# Patient Record
Sex: Female | Born: 1955 | Race: White | Hispanic: No | Marital: Married | Smoking: Never smoker
Health system: Southern US, Community
[De-identification: ages and names within clinical notes are randomized; demographics above are authoritative.]

## PROBLEM LIST (undated history)

## (undated) DIAGNOSIS — E859 Amyloidosis, unspecified: Secondary | ICD-10-CM

## (undated) DIAGNOSIS — C801 Malignant (primary) neoplasm, unspecified: Secondary | ICD-10-CM

## (undated) DIAGNOSIS — E785 Hyperlipidemia, unspecified: Secondary | ICD-10-CM

## (undated) DIAGNOSIS — M329 Systemic lupus erythematosus, unspecified: Secondary | ICD-10-CM

## (undated) DIAGNOSIS — IMO0002 Reserved for concepts with insufficient information to code with codable children: Secondary | ICD-10-CM

## (undated) DIAGNOSIS — K219 Gastro-esophageal reflux disease without esophagitis: Secondary | ICD-10-CM

## (undated) DIAGNOSIS — M1990 Unspecified osteoarthritis, unspecified site: Secondary | ICD-10-CM

## (undated) HISTORY — DX: Unspecified osteoarthritis, unspecified site: M19.90

## (undated) HISTORY — DX: Hyperlipidemia, unspecified: E78.5

## (undated) HISTORY — PX: BREAST EXCISIONAL BIOPSY: SUR124

## (undated) HISTORY — PX: ABDOMINAL HYSTERECTOMY: SHX81

## (undated) HISTORY — PX: BREAST LUMPECTOMY: SHX2

## (undated) HISTORY — PX: BLADDER SURGERY: SHX569

## (undated) HISTORY — DX: Amyloidosis, unspecified: E85.9

---

## 2004-09-20 ENCOUNTER — Ambulatory Visit: Payer: Self-pay | Admitting: Family Medicine

## 2004-09-27 ENCOUNTER — Ambulatory Visit: Payer: Self-pay | Admitting: Family Medicine

## 2006-10-02 ENCOUNTER — Ambulatory Visit: Payer: Self-pay | Admitting: Internal Medicine

## 2006-10-16 ENCOUNTER — Ambulatory Visit: Payer: Self-pay | Admitting: Unknown Physician Specialty

## 2007-02-16 ENCOUNTER — Emergency Department: Payer: Self-pay | Admitting: Emergency Medicine

## 2007-06-12 ENCOUNTER — Observation Stay: Payer: Self-pay | Admitting: Internal Medicine

## 2007-06-12 ENCOUNTER — Other Ambulatory Visit: Payer: Self-pay

## 2007-06-13 ENCOUNTER — Other Ambulatory Visit: Payer: Self-pay

## 2007-07-06 ENCOUNTER — Ambulatory Visit: Payer: Self-pay | Admitting: Family Medicine

## 2007-08-17 ENCOUNTER — Ambulatory Visit: Payer: Self-pay | Admitting: Family Medicine

## 2011-11-09 ENCOUNTER — Emergency Department (INDEPENDENT_AMBULATORY_CARE_PROVIDER_SITE_OTHER)
Admission: EM | Admit: 2011-11-09 | Discharge: 2011-11-09 | Disposition: A | Discharge status: Home / Self Care | Payer: Self-pay | Source: Home / Self Care | Attending: Family Medicine | Admitting: Family Medicine

## 2011-11-09 ENCOUNTER — Encounter (HOSPITAL_COMMUNITY): Payer: Self-pay | Admitting: Emergency Medicine

## 2011-11-09 DIAGNOSIS — E119 Type 2 diabetes mellitus without complications: Secondary | ICD-10-CM

## 2011-11-09 HISTORY — DX: Gastro-esophageal reflux disease without esophagitis: K21.9

## 2011-11-09 HISTORY — DX: Reserved for concepts with insufficient information to code with codable children: IMO0002

## 2011-11-09 HISTORY — DX: Systemic lupus erythematosus, unspecified: M32.9

## 2011-11-09 LAB — POCT I-STAT, CHEM 8
BUN: 23 mg/dL (ref 6–23)
Chloride: 100 mEq/L (ref 96–112)
Creatinine, Ser: 1.1 mg/dL (ref 0.50–1.10)
Sodium: 136 mEq/L (ref 135–145)
TCO2: 28 mmol/L (ref 0–100)

## 2011-11-09 MED ORDER — SITAGLIPTIN PHOS-METFORMIN HCL 50-1000 MG PO TABS: 1.0000 | ORAL_TABLET | Freq: Two times a day (BID) | ORAL | Status: DC

## 2011-11-09 NOTE — ED Notes (Signed)
Patient discharged by sandra emt

## 2011-11-09 NOTE — ED Provider Notes (Signed)
History     CSN: [id number]  Arrival date & time 11/09/11  1404   First MD Initiated Contact with Patient 11/09/11 1423      Chief Complaint  Patient presents with  . Diabetes    (Consider location/radiation/quality/duration/timing/severity/associated sxs/prior treatment) Patient is a 56 y.o. female presenting with diabetes problem. The history is provided by the patient.  Diabetes She presents for her initial diabetic visit. She has type 2 diabetes mellitus. Pertinent negatives for hypoglycemia include no confusion, dizziness, mood changes, nervousness/anxiousness, pallor, seizures, sleepiness, speech difficulty, sweats or tremors. Associated symptoms include polydipsia, polyuria and visual change. Pertinent negatives for diabetes include no blurred vision, no chest pain, no fatigue, no foot paresthesias, no foot ulcerations, no polyphagia, no weakness and no weight loss. Pertinent negatives for hypoglycemia complications include no blackouts, no hospitalization, no nocturnal hypoglycemia, no required assistance and no required glucagon injection. Symptoms are stable. There are no diabetic complications. Risk factors for coronary artery disease include family history (mother w/harding of the arteries  ad diabetes). Her weight is stable. She has not had a previous visit with a dietician. She participates in exercise intermittently. She does not see a podiatrist.Eye exam is current.    Past Medical History  Diagnosis Date  . Diabetes mellitus   . GERD (gastroesophageal reflux disease)   . Lupus     Past Surgical History  Procedure Date  . Abdominal hysterectomy   . Bladder surgery     History reviewed. No pertinent family history.  History  Substance Use Topics  . Smoking status: Never Smoker   . Smokeless tobacco: Not on file  . Alcohol Use: No    OB History    Grav Para Term Preterm Abortions TAB SAB Ect Mult Living                  Review of Systems  Constitutional:  Negative for weight loss and fatigue.  Eyes: Negative for blurred vision.  Cardiovascular: Negative for chest pain.  Genitourinary: Positive for polyuria.  Skin: Negative for pallor.  Neurological: Negative for dizziness, tremors, seizures, speech difficulty and weakness.  Hematological: Positive for polydipsia. Negative for polyphagia.  Psychiatric/Behavioral: Negative for confusion. The patient is not nervous/anxious.     Allergies  Review of patient's allergies indicates no known allergies.  Home Medications   Current Outpatient Rx  Name Route Sig Dispense Refill  . IBUPROFEN 200 MG PO TABS Oral Take 200 mg by mouth every 6 (six) hours as needed.    Marland Kitchen OMEPRAZOLE 20 MG PO CPDR Oral Take 20 mg by mouth daily.    Marland Kitchen PHENAZOPYRIDINE HCL 100 MG PO TABS Oral Take 100 mg by mouth 3 (three) times daily as needed.    . SULFAMETHOXAZOLE-TMP DS 800-160 MG PO TABS Oral Take 1 tablet by mouth 2 (two) times daily.      BP 152/80  Pulse 86  Temp(Src) 98.3 F (36.8 C) (Oral)  Resp 12  SpO2 98%  Physical Exam  Constitutional: She appears well-developed and well-nourished.  HENT:  Head: Normocephalic.  Neck: Normal range of motion.  Cardiovascular: Normal rate, regular rhythm and normal heart sounds.   Pulmonary/Chest: Effort normal.  Neurological: She is alert.  Skin: Skin is warm and dry.  Psychiatric: She has a normal mood and affect. Her behavior is normal.    ED Course  Procedures (including critical care time)    Results for orders placed during the hospital encounter of 11/09/11  POCT I-STAT, CHEM 8  Component Value Range   Sodium 136  135 - 145 (mEq/L)   Potassium 4.1  3.5 - 5.1 (mEq/L)   Chloride 100  96 - 112 (mEq/L)   BUN 23  6 - 23 (mg/dL)   Creatinine, Ser 1.61  0.50 - 1.10 (mg/dL)   Glucose, Bld 096 (*) 70 - 99 (mg/dL)   Calcium, Ion 0.45  4.09 - 1.32 (mmol/L)   TCO2 28  0 - 100 (mmol/L)   Hemoglobin 14.6  12.0 - 15.0 (g/dL)   HCT 81.1  91.4 - 78.2 (%)    Diabetes.  A1C pending Follow up w/a PCP ofchoice  MDM          Hassan Rowan, MD 11/09/11 1610

## 2011-11-09 NOTE — ED Notes (Signed)
[facility].  Diagnosed with uti, and noted sugar in urine.  [facility] coming to [facility] for further evaluation of glucose.  Report eating around 1:00 pm today: piece of chicken, slaw and water.  Patient reports in 2007 or 2008 was diagnosed as diabetic, patient took metformin and actos.  Then, 2010 physician stopped medicine and told her she did not need medicine.  Has not seen physician since then.

## 2011-11-12 ENCOUNTER — Telehealth (HOSPITAL_COMMUNITY): Payer: Self-pay | Admitting: *Deleted

## 2011-11-12 NOTE — ED Notes (Signed)
Hgb A1C 11.3 H, avg. daily glucose 278. Dr. Thurmond Butts started pt. on Janumet but is not here the rest of the month. Lab shown to Dr. Lorenz Coaster, and he said to tell pt. to f/u with PCP ASAP. I called pt. and verified x 2. Pt. given results and instructions. Pt. states she had diabetes '08-'10, but " her doctor said it cleared up and was taken off medication. Pt. has no PCP or insurance. 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 Pt. is going to try and get on with her husbands doctor. Dr. Thurmond Butts told her she could come to [facility] to get a refill of her Janumet. I also suggested she call [facility] and gave her the phone numbers. Vassie Moselle 11/12/2011

## 2013-12-29 ENCOUNTER — Encounter (HOSPITAL_COMMUNITY): Payer: Self-pay | Admitting: Emergency Medicine

## 2013-12-29 ENCOUNTER — Emergency Department (HOSPITAL_COMMUNITY)
Admission: EM | Admit: 2013-12-29 | Discharge: 2013-12-29 | Disposition: A | Discharge status: Home / Self Care | Payer: BC Managed Care – PPO | Source: Home / Self Care | Attending: Family Medicine | Admitting: Family Medicine

## 2013-12-29 DIAGNOSIS — J069 Acute upper respiratory infection, unspecified: Secondary | ICD-10-CM

## 2013-12-29 MED ORDER — BENZONATATE 100 MG PO CAPS: 100.0000 mg | ORAL_CAPSULE | Freq: Three times a day (TID) | ORAL | Status: DC | PRN

## 2013-12-29 NOTE — Discharge Instructions (Signed)

## 2013-12-29 NOTE — ED Provider Notes (Signed)
Medical screening examination/treatment/procedure(s) were performed by a resident physician or non-physician practitioner and as the supervising physician I was immediately available for consultation/collaboration.  Lynne Leader, MD    Gregor Hams, MD 12/29/13 [phone]

## 2013-12-29 NOTE — ED Notes (Signed)
Pt  Reports  Symptoms  Of   Body  Aches      Chills  Headaches  Treated    Recently    For  Throat  Infection       Reports       Urinary  Irritation as  Well  Pt  Still  Has  2  Days  Of  Anti  Biotics  Left

## 2013-12-29 NOTE — ED Provider Notes (Signed)
CSN: [id number]     Arrival date & time 12/29/13  0804 History   First MD Initiated Contact with Patient 12/29/13 [phone]     No chief complaint on file.  (Consider location/radiation/quality/duration/timing/severity/associated sxs/prior Treatment) HPI Comments: States she was seen for sore throat at [facility] in [place], [place] on 12-22-2013 and diagnosed with strep pharyngitis and placed on antibiotics. Has been taking medication as prescribed but has now developed nasal congestion, cough and malaise. Presents for evaluation. Is not taking any additional meds for URI sx at home. PCP: [facility] in [place] (has not attempted to be seen by PCP).   Patient is a 58 y.o. female presenting with URI. The history is provided by the patient.  URI Presenting symptoms: congestion, cough, fatigue and rhinorrhea   Presenting symptoms: no fever and no sore throat   Severity:  Moderate Onset quality:  Gradual Duration:  3 days Timing:  Constant Progression:  Unchanged Chronicity:  New   Past Medical History  Diagnosis Date  . Diabetes mellitus   . GERD (gastroesophageal reflux disease)   . Lupus    Past Surgical History  Procedure Laterality Date  . Abdominal hysterectomy    . Bladder surgery     No family history on file. History  Substance Use Topics  . Smoking status: Never Smoker   . Smokeless tobacco: Not on file  . Alcohol Use: No   OB History   Grav Para Term Preterm Abortions TAB SAB Ect Mult Living                 Review of Systems  Constitutional: Positive for fatigue. Negative for fever.  HENT: Positive for congestion and rhinorrhea. Negative for sore throat.   Eyes: Negative.   Respiratory: Positive for cough. Negative for chest tightness.   Cardiovascular: Negative.   Gastrointestinal: Negative.   Endocrine: Negative for polydipsia and polyphagia.  Musculoskeletal: Negative.   Skin: Negative.   Neurological: Negative.     Allergies  Review of patient's  allergies indicates no known allergies.  Home Medications   Prior to Admission medications   Medication Sig Start Date End Date Taking? Authorizing Provider  ibuprofen (ADVIL,MOTRIN) 200 MG tablet Take 200 mg by mouth every 6 (six) hours as needed.    Historical Provider, MD  omeprazole (PRILOSEC) 20 MG capsule Take 20 mg by mouth daily.    Historical Provider, MD  phenazopyridine (PYRIDIUM) 100 MG tablet Take 100 mg by mouth 3 (three) times daily as needed.    Historical Provider, MD  sitaGLIPtan-metformin (JANUMET) 50-1000 MG per tablet Take 1 tablet by mouth 2 (two) times daily with a meal. 11/09/11 11/08/12  Frederich Cha, MD  sulfamethoxazole-trimethoprim (BACTRIM DS) 800-160 MG per tablet Take 1 tablet by mouth 2 (two) times daily.    Historical Provider, MD   BP 145/90  Pulse 96  Temp(Src) 98.3 F (36.8 C) (Oral)  Resp 16  SpO2 99% Physical Exam  Nursing note and vitals reviewed. Constitutional: She is oriented to person, place, and time. She appears well-developed and well-nourished. No distress.  HENT:  Head: Normocephalic and atraumatic.  Right Ear: Hearing, tympanic membrane, external ear and ear canal normal.  Left Ear: Hearing, tympanic membrane, external ear and ear canal normal.  Nose: Nose normal.  Mouth/Throat: Uvula is midline, oropharynx is clear and moist and mucous membranes are normal. No oral lesions. No trismus in the jaw. No uvula swelling.  Eyes: Conjunctivae are normal. Right eye exhibits no discharge. Left eye  exhibits no discharge. No scleral icterus.  Neck: Normal range of motion. Neck supple. No thyromegaly present.  Cardiovascular: Normal rate, regular rhythm and normal heart sounds.   Pulmonary/Chest: Effort normal and breath sounds normal. No respiratory distress. She has no wheezes.  Abdominal: Soft. Bowel sounds are normal. She exhibits no distension. There is no tenderness.  Musculoskeletal: Normal range of motion.  Lymphadenopathy:    She has no  cervical adenopathy.  Neurological: She is alert and oriented to person, place, and time.  Skin: Skin is warm and dry. No rash noted.  Psychiatric: She has a normal mood and affect. Her behavior is normal.    ED Course  Procedures (including critical care time) Labs Review Labs Reviewed - No data to display  Results for orders placed during the hospital encounter of 11/09/11  HEMOGLOBIN A1C      Result Value Ref Range   Hemoglobin A1C 11.3 (*) <5.7 %   Mean Plasma Glucose 278 (*) <117 mg/dL  POCT I-STAT, CHEM 8      Result Value Ref Range   Sodium 136  135 - 145 mEq/L   Potassium 4.1  3.5 - 5.1 mEq/L   Chloride 100  96 - 112 mEq/L   BUN 23  6 - 23 mg/dL   Creatinine, Ser 1.10  0.50 - 1.10 mg/dL   Glucose, Bld 307 (*) 70 - 99 mg/dL   Calcium, Ion 1.17  1.12 - 1.32 mmol/L   TCO2 28  0 - 100 mmol/L   Hemoglobin 14.6  12.0 - 15.0 g/dL   HCT 43.0  36.0 - 46.0 %   Imaging Review No results found.   MDM   1. URI (upper respiratory infection)    URI: will advise good hydration, tylenol as directed on packaging and Tessalon for cough.     [place], [place] 12/29/13 [phone]

## 2015-06-15 ENCOUNTER — Encounter: Payer: Self-pay | Admitting: Primary Care

## 2015-06-15 ENCOUNTER — Ambulatory Visit (INDEPENDENT_AMBULATORY_CARE_PROVIDER_SITE_OTHER): Payer: BLUE CROSS/BLUE SHIELD | Admitting: Primary Care

## 2015-06-15 VITALS — BP 116/82 | HR 75 | Temp 97.5°F | Ht 64.75 in | Wt 139.1 lb

## 2015-06-15 DIAGNOSIS — E1169 Type 2 diabetes mellitus with other specified complication: Secondary | ICD-10-CM | POA: Insufficient documentation

## 2015-06-15 DIAGNOSIS — E785 Hyperlipidemia, unspecified: Secondary | ICD-10-CM | POA: Diagnosis not present

## 2015-06-15 DIAGNOSIS — IMO0002 Reserved for concepts with insufficient information to code with codable children: Secondary | ICD-10-CM | POA: Insufficient documentation

## 2015-06-15 DIAGNOSIS — E1165 Type 2 diabetes mellitus with hyperglycemia: Secondary | ICD-10-CM | POA: Insufficient documentation

## 2015-06-15 DIAGNOSIS — K219 Gastro-esophageal reflux disease without esophagitis: Secondary | ICD-10-CM

## 2015-06-15 DIAGNOSIS — E119 Type 2 diabetes mellitus without complications: Secondary | ICD-10-CM | POA: Diagnosis not present

## 2015-06-15 MED ORDER — SIMVASTATIN 20 MG PO TABS: 20.0000 mg | ORAL_TABLET | Freq: Every day | ORAL | Status: DC

## 2015-06-15 NOTE — Patient Instructions (Addendum)
Start Simvastatin 20 mg tablets for cholesterol. Take 1 tablet by mouth at bedtime.  Please schedule a follow up appointment in 3 months for re-evaluation of diabetes and cholesterol. Come fasting for at least 4 hours (water and black coffee only) as we will do blood work that day.  It was a pleasure to meet you today! Please don't hesitate to call me with any questions. Welcome to [facility]!

## 2015-06-15 NOTE — Progress Notes (Signed)
Subjective:    Patient ID: Miranda Greene, female    DOB: [birth year], 59 y.o.   MRN: [id number]  HPI  Miranda Greene is a 59 year old female who presents today to establish care and discuss the problems mentioned below. Will review old records.  1) Type 2 Diabetes: Diagnosed in 2005, went in remission in 2009 and returned back in 2012. Currently managed on glipizide 10 mg BID, invokana 100 mg once daily, and Saxagliptin-Metformin 01-999 mg once daily. Her last A1C was 7.3 in September 2016. She reports a lot of stress since April this year. She endorses a healthy diet and leads an active lifestyle.   Her diet currently consists of: Breakfast: Cereal Snack: Cheese and crackers, apple, yougurt Lunch: Lean cuseine, hot dog, hamburger, veggies. Snack: Apple, yougurt Dinner: [employer], veggies, salads Snack: Occasionally, salsa and chips Desserts: None Beverages: Coffee, flavored water, unsweet tea, water  Exercise: She does not regularly exercise but leads an active lifestyle.    2) Urinary incontinence: History of 2 bladder surgeries, last one in 2012 with band/mesh surgery. She does not currently follow with a Urologist at this time.   3) GERD: Currently managed on omeprazole 20 mg tablets every other day. Without her medication she will experience esophageal burning and occasional reflux.   Review of Systems  Constitutional: Negative for unexpected weight change.  HENT: Negative for rhinorrhea.   Respiratory: Negative for cough and shortness of breath.   Cardiovascular: Negative for chest pain.  Gastrointestinal: Negative for diarrhea and constipation.  Genitourinary: Negative for difficulty urinating.  Musculoskeletal: Positive for arthralgias.  Skin: Negative for rash.  Allergic/Immunologic: Positive for environmental allergies.  Neurological: Negative for dizziness, numbness and headaches.  Psychiatric/Behavioral:       Denies concerns for anxiety or depression       Past  Medical History  Diagnosis Date  . Diabetes mellitus   . GERD (gastroesophageal reflux disease)   . Lupus ([place])   . Arthritis     Hands, feet    Social History   Social History  . Marital Status: Married    Spouse Name: N/A  . Number of Children: N/A  . Years of Education: N/A   Occupational History  . Not on file.   Social History Main Topics  . Smoking status: Never Smoker   . Smokeless tobacco: Not on file  . Alcohol Use: No  . Drug Use: No  . Sexual Activity: Not on file   Other Topics Concern  . Not on file   Social History Narrative   Married.   4 children.    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    Past Surgical History  Procedure Laterality Date  . Abdominal hysterectomy    . Bladder surgery    . Breast lumpectomy      Benign    Family History  Problem Relation Age of Onset  . Arthritis Mother   . Stroke Mother   . Kidney disease Mother   . Diabetes Mother   . Dementia Mother   . Alcohol abuse Father   . Lung cancer Father   . Heart disease Father   . Stroke Maternal Grandmother   . Arthritis Paternal Grandmother   . Heart disease Paternal Grandmother   . Diabetes Paternal Grandmother     No Known Allergies  Current Outpatient Prescriptions on File Prior to Visit  Medication  Sig Dispense Refill  . ibuprofen (ADVIL,MOTRIN) 200 MG tablet Take 200 mg by mouth every 6 (six) hours as needed.    Marland Kitchen omeprazole (PRILOSEC) 20 MG capsule Take 20 mg by mouth daily.     No current facility-administered medications on file prior to visit.    BP 116/82 mmHg  Pulse 75  Temp(Src) 97.5 F (36.4 C) (Oral)  Ht 5' 4.75" (1.645 m)  Wt 139 lb 1.9 oz (63.104 kg)  BMI 23.32 kg/m2  SpO2 98%    Objective:   Physical Exam  Constitutional: She is oriented to person, place, and time. She appears well-nourished.  Cardiovascular: Normal rate and regular rhythm.     Pulmonary/Chest: Effort normal and breath sounds normal.  Neurological: She is alert and oriented to person, place, and time.  Skin: Skin is warm and dry.  Psychiatric: She has a normal mood and affect.          Assessment & Plan:

## 2015-06-15 NOTE — Progress Notes (Signed)
Pre visit review using our clinic review tool, if applicable. No additional management support is needed unless otherwise documented below in the visit note. 

## 2015-06-15 NOTE — Assessment & Plan Note (Signed)
Currently takes omeprazole 20 mg every other day for symptoms of esophageal burning. Occasional reflux.

## 2015-06-15 NOTE — Assessment & Plan Note (Addendum)
Diagnosed in 2005. Currently managed on glipizide, invokana, saxagliptin-metformin. A1C of 7.3 in 05/2015. Discussed importance of healthy diet and exercise. Foot exam completed today. Urine microalbumin completed in 05/2015 and was normal per records. Statin initiated today due to long standing history of hyperlipidemia. Recheck A1C in 3 months.

## 2015-06-15 NOTE — Assessment & Plan Note (Signed)
Reviewed from records, long standing history of LDL above goal. She is not currently managed on a statin, initiated Simvastatin 20 mg HS today. Discussed potential side effects. Will recheck lipids in 3 months.

## 2015-10-19 ENCOUNTER — Other Ambulatory Visit: Payer: Self-pay | Admitting: Primary Care

## 2015-10-19 ENCOUNTER — Ambulatory Visit (INDEPENDENT_AMBULATORY_CARE_PROVIDER_SITE_OTHER): Payer: BLUE CROSS/BLUE SHIELD | Admitting: Primary Care

## 2015-10-19 ENCOUNTER — Encounter: Payer: Self-pay | Admitting: Primary Care

## 2015-10-19 VITALS — BP 122/86 | HR 82 | Temp 97.6°F | Ht 65.0 in | Wt 141.8 lb

## 2015-10-19 DIAGNOSIS — E119 Type 2 diabetes mellitus without complications: Secondary | ICD-10-CM

## 2015-10-19 DIAGNOSIS — E785 Hyperlipidemia, unspecified: Secondary | ICD-10-CM | POA: Diagnosis not present

## 2015-10-19 LAB — LIPID PANEL
Cholesterol: 128 mg/dL (ref 0–200)
HDL: 35.3 mg/dL — AB (ref >39.00)
LDL Cholesterol: 74 mg/dL (ref 0–99)
NONHDL: 92.47
Total CHOL/HDL Ratio: 4
Triglycerides: 94 mg/dL (ref 0.0–149.0)
VLDL: 18.8 mg/dL (ref 0.0–40.0)

## 2015-10-19 LAB — HEMOGLOBIN A1C: HEMOGLOBIN A1C: 7.7 % — AB (ref 4.6–6.5)

## 2015-10-19 NOTE — Progress Notes (Signed)
Pre visit review using our clinic review tool, if applicable. No additional management support is needed unless otherwise documented below in the visit note. 

## 2015-10-19 NOTE — Assessment & Plan Note (Signed)
Slightly improved diet. A1C due today and is pending. Foot exam completed last visit, declines pneumonia vaccination, currently on statin, normal microalbumin, not currently on ACE.

## 2015-10-19 NOTE — Addendum Note (Signed)
Addended by: Marchia Bond on: 10/19/2015 01:38 PM   Modules accepted: Miquel Dunn

## 2015-10-19 NOTE — Assessment & Plan Note (Signed)
Takes simvastatin 20 mg 4-5 times weekly as she often forgets. Denies myalgias. Lipid panel pending today.  Discussed importance of healthy diet and exercise.

## 2015-10-19 NOTE — Patient Instructions (Addendum)
Complete lab work prior to leaving today. I will notify you of your results once received.   Please schedule a physical with me in 6 months. You may also schedule a lab only appointment 3-4 days prior. We will discuss your lab results in detail during your physical.  It was a pleasure to see you today!  

## 2015-10-19 NOTE — Progress Notes (Signed)
Subjective:    Patient ID: Miranda Greene, female    DOB: [birth year], 58 y.o.   MRN: [id number]  HPI  Miranda Greene is a 60 year old female who presents today for follow up.  1) Type 2 Diabetes: Currently managed on Invokana 100 mg, glipizide 10 mg BID, and saxagliptin-metformin 01-999 mg. A1C of 7.3 in September 2016, normal microalbumin per records, foot exam completed in October 2016, currently not managed on an ACE. She was initiated on a statin during her last visit. She was encouraged to work on improving her diet and exercise. She is due for repeat A1C today. Declines pneumonia vaccination today.  2) Hyperlipidemia: Long history of hyperlipidemia. Initiated on statin therapy in October 2016 with Simvastatin 20 mg. She admits to forgetting to take her Simvastatin about 3 times weekly. Denies myalgias.   She's improved her diet by cutting back on her consumption of crackers and cheese and fast food, and is eating more kale salad. 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 She has been under stress with her husbands health and her son's car accident. Denies chest pain or shortness of breath.   Wt Readings from Last 3 Encounters:  10/19/15 141 lb 12.8 oz (64.32 kg)  06/15/15 139 lb 1.9 oz (63.104 kg)       Review of Systems  Respiratory: Negative for shortness of breath.   Cardiovascular: Negative for chest pain.  Neurological: Negative for dizziness and numbness.       Past Medical History  Diagnosis Date  . Diabetes mellitus   . GERD (gastroesophageal reflux disease)   . Lupus ([place])   . Arthritis     Hands, feet  . Hyperlipidemia     Social History   Social History  . Marital Status: Married    Spouse Name: N/A  . Number of Children: N/A  . Years of Education: N/A   Occupational History  . Not on file.   Social History Main Topics  . Smoking status: Never Smoker   . Smokeless tobacco: Not on file  . Alcohol Use: No  . Drug Use: No  . Sexual Activity:  Not on file   Other Topics Concern  . Not on file   Social History Narrative   Married.   4 children.    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    Past Surgical History  Procedure Laterality Date  . Abdominal hysterectomy    . Bladder surgery    . Breast lumpectomy      Benign    Family History  Problem Relation Age of Onset  . Arthritis Mother   . Stroke Mother   . Kidney disease Mother   . Diabetes Mother   . Dementia Mother   . Alcohol abuse Father   . Lung cancer Father   . Heart disease Father   . Stroke Maternal Grandmother   . Arthritis Paternal Grandmother   . Heart disease Paternal Grandmother   . Diabetes Paternal Grandmother     No Known Allergies  Current Outpatient Prescriptions on File Prior to Visit  Medication Sig Dispense Refill  . canagliflozin (INVOKANA) 100 MG TABS tablet Take 100 mg by mouth daily.    Marland Kitchen glipiZIDE (GLUCOTROL) 10 MG tablet Take 10 mg by mouth 2 (two) times daily before a meal.    . ibuprofen (ADVIL,MOTRIN) 200 MG tablet Take 200 mg  by mouth every 6 (six) hours as needed.    Marland Kitchen omeprazole (PRILOSEC) 20 MG capsule Take 20 mg by mouth daily.    . Saxagliptin-Metformin (KOMBIGLYZE XR) 01-999 MG TB24 Take 1 tablet by mouth daily.    . simvastatin (ZOCOR) 20 MG tablet Take 1 tablet (20 mg total) by mouth at bedtime. 90 tablet 3   No current facility-administered medications on file prior to visit.    BP 122/86 mmHg  Pulse 82  Temp(Src) 97.6 F (36.4 C) (Oral)  Ht 5\' 5"  (1.651 m)  Wt 141 lb 12.8 oz (64.32 kg)  BMI 23.60 kg/m2  SpO2 98%    Objective:   Physical Exam  Constitutional: She appears well-nourished.  Cardiovascular: Normal rate and regular rhythm.   Pulmonary/Chest: Effort normal and breath sounds normal.  Skin: Skin is warm and dry.          Assessment & Plan:

## 2015-10-20 ENCOUNTER — Encounter: Payer: Self-pay | Admitting: *Deleted

## 2015-10-24 ENCOUNTER — Other Ambulatory Visit: Payer: Self-pay | Admitting: Primary Care

## 2015-10-24 ENCOUNTER — Other Ambulatory Visit (INDEPENDENT_AMBULATORY_CARE_PROVIDER_SITE_OTHER): Payer: BLUE CROSS/BLUE SHIELD

## 2015-10-24 DIAGNOSIS — E119 Type 2 diabetes mellitus without complications: Secondary | ICD-10-CM | POA: Diagnosis not present

## 2015-10-24 LAB — COMPREHENSIVE METABOLIC PANEL
ALBUMIN: 4.4 g/dL (ref 3.5–5.2)
ALT: 17 U/L (ref 0–35)
AST: 24 U/L (ref 0–37)
Alkaline Phosphatase: 67 U/L (ref 39–117)
BILIRUBIN TOTAL: 0.7 mg/dL (ref 0.2–1.2)
BUN: 22 mg/dL (ref 6–23)
CALCIUM: 9.6 mg/dL (ref 8.4–10.5)
CHLORIDE: 105 meq/L (ref 96–112)
CO2: 29 mEq/L (ref 19–32)
CREATININE: 0.96 mg/dL (ref 0.40–1.20)
GFR: 63.48 mL/min (ref >60.00)
Glucose, Bld: 124 mg/dL — ABNORMAL HIGH (ref 70–99)
Potassium: 4.7 mEq/L (ref 3.5–5.1)
Sodium: 138 mEq/L (ref 135–145)
Total Protein: 7.4 g/dL (ref 6.0–8.3)

## 2015-10-24 MED ORDER — CANAGLIFLOZIN 300 MG PO TABS: 300.0000 mg | ORAL_TABLET | Freq: Every day | ORAL | Status: DC

## 2015-10-26 ENCOUNTER — Other Ambulatory Visit: Payer: BLUE CROSS/BLUE SHIELD

## 2015-11-06 ENCOUNTER — Other Ambulatory Visit: Payer: Self-pay

## 2015-11-06 DIAGNOSIS — E119 Type 2 diabetes mellitus without complications: Secondary | ICD-10-CM

## 2015-11-06 MED ORDER — SAXAGLIPTIN-METFORMIN ER 5-1000 MG PO TB24: 1.0000 | ORAL_TABLET | Freq: Every day | ORAL | Status: DC

## 2015-11-06 NOTE — Telephone Encounter (Signed)
Pt left v/m; pt was seen 10/19/15; Allie Bossier NP has not filled kombiglyze XR and per 10/19/15 lab result note pt wanted to verify that pt is to continue taking the Kombiglyze XR.(pt was close to going on insulin per note). If pt is to continue med please refill to [facility] in [place]. Pt request cb.

## 2015-11-29 ENCOUNTER — Ambulatory Visit (INDEPENDENT_AMBULATORY_CARE_PROVIDER_SITE_OTHER): Payer: BLUE CROSS/BLUE SHIELD | Admitting: Internal Medicine

## 2015-11-29 ENCOUNTER — Encounter: Payer: Self-pay | Admitting: Internal Medicine

## 2015-11-29 VITALS — BP 116/78 | HR 60 | Temp 98.1°F | Wt 139.0 lb

## 2015-11-29 DIAGNOSIS — R3915 Urgency of urination: Secondary | ICD-10-CM

## 2015-11-29 DIAGNOSIS — R350 Frequency of micturition: Secondary | ICD-10-CM

## 2015-11-29 DIAGNOSIS — R300 Dysuria: Secondary | ICD-10-CM

## 2015-11-29 LAB — POC URINALSYSI DIPSTICK (AUTOMATED)
Ketones, UA: NEGATIVE
NITRITE UA: POSITIVE
Protein, UA: NEGATIVE
Spec Grav, UA: 1.025
UROBILINOGEN UA: 1
pH, UA: 6

## 2015-11-29 MED ORDER — NITROFURANTOIN MONOHYD MACRO 100 MG PO CAPS: 100.0000 mg | ORAL_CAPSULE | Freq: Two times a day (BID) | ORAL | Status: DC

## 2015-11-29 NOTE — Patient Instructions (Signed)

## 2015-11-29 NOTE — Progress Notes (Signed)
Pre visit review using our clinic review tool, if applicable. No additional management support is needed unless otherwise documented below in the visit note. 

## 2015-11-29 NOTE — Addendum Note (Signed)
Addended by: Lurlean Nanny on: 11/29/2015 01:54 PM   Modules accepted: Orders

## 2015-11-29 NOTE — Progress Notes (Signed)
HPI  Pt presents to the clinic today with c/o urgency, frequency and dysuria. This started 3 days ago. She does have some pressure in her suprapubic area as well. She denies fever, chills, nausea or low back pain. She has taken AZO and Tylenol without any relief. She denies vaginal concerns. She has had UTI's in the past and reports this feels the same.   Review of Systems  Past Medical History  Diagnosis Date  . Diabetes mellitus   . GERD (gastroesophageal reflux disease)   . Lupus ([place])   . Arthritis     Hands, feet  . Hyperlipidemia     Family History  Problem Relation Age of Onset  . Arthritis Mother   . Stroke Mother   . Kidney disease Mother   . Diabetes Mother   . Dementia Mother   . Alcohol abuse Father   . Lung cancer Father   . Heart disease Father   . Stroke Maternal Grandmother   . Arthritis Paternal Grandmother   . Heart disease Paternal Grandmother   . Diabetes Paternal Grandmother     Social History   Social History  . Marital Status: Married    Spouse Name: N/A  . Number of Children: N/A  . Years of Education: N/A   Occupational History  . Not on file.   Social History Main Topics  . Smoking status: Never Smoker   . Smokeless tobacco: Not on file  . Alcohol Use: No  . Drug Use: No  . Sexual Activity: Not on file   Other Topics Concern  . Not on file   Social History Narrative   Married.   4 children.    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    No Known Allergies  Constitutional: Denies fever, malaise, fatigue, headache or abrupt weight changes.   GU: Pt reports urgency, frequency and pain with urination. Denies burning sensation, blood in urine, odor or discharge. Skin: Denies redness, rashes, lesions or ulcercations.   No other specific complaints in a complete review of systems (except as listed in HPI above).    Objective:   Physical Exam   Wt  Readings from Last 3 Encounters:  11/29/15 139 lb (63.05 kg)  10/19/15 141 lb 12.8 oz (64.32 kg)  06/15/15 139 lb 1.9 oz (63.104 kg)    General: Appears her stated age, well developed, well nourished in NAD. Cardiovascular: Normal rate and rhythm. S1,S2 noted.   Pulmonary/Chest: Normal effort and positive vesicular breath sounds. No respiratory distress. No wheezes, rales or ronchi noted.  Abdomen: Soft. Normal bowel sounds. No distention or masses noted.  Tender to palpation over the bladder area. No CVA tenderness.      Assessment & Plan:   Urgency, Frequency, Dysuria secondary to Possible UTI  Urinalysis: AZO interference Will send urine culture eRx sent if for Macrobid 100 mg BID x 5 days OK to take AZO OTC Drink plenty of fluids  RTC as needed or if symptoms persist.

## 2015-12-01 LAB — URINE CULTURE: Colony Count: >=100000

## 2015-12-06 ENCOUNTER — Telehealth: Payer: Self-pay

## 2015-12-06 MED ORDER — FLUCONAZOLE 150 MG PO TABS: 150.0000 mg | ORAL_TABLET | Freq: Once | ORAL | Status: DC

## 2015-12-06 NOTE — Telephone Encounter (Signed)
Pt notified Rx sent to pharmacy and advise to hold simvastatin the day she takes Rx and to f/u with Anda Kraft if no improvement

## 2015-12-06 NOTE — Telephone Encounter (Signed)
Pt was seen by Rollene Fare last week and she states that [place] told her that if she developed an Ax induced yeast infection to call and she can send Rx to pharmacy--please advise

## 2015-12-06 NOTE — Telephone Encounter (Signed)
I sent diflucan to her pharmacy  Hold your simvastatin the day you take it -there can be an interaction  F/u if no improvement

## 2016-01-25 ENCOUNTER — Other Ambulatory Visit (INDEPENDENT_AMBULATORY_CARE_PROVIDER_SITE_OTHER): Payer: BLUE CROSS/BLUE SHIELD

## 2016-01-25 DIAGNOSIS — E119 Type 2 diabetes mellitus without complications: Secondary | ICD-10-CM | POA: Diagnosis not present

## 2016-01-25 LAB — HEMOGLOBIN A1C: Hgb A1c MFr Bld: 7.3 % — ABNORMAL HIGH (ref 4.6–6.5)

## 2016-01-26 ENCOUNTER — Telehealth: Payer: Self-pay | Admitting: Primary Care

## 2016-01-26 NOTE — Telephone Encounter (Signed)
Called and notified patient of Miranda Greene's comments. Patient verbalized understanding.  

## 2016-01-26 NOTE — Telephone Encounter (Signed)
Patient returned Chan's call. °

## 2016-03-08 ENCOUNTER — Other Ambulatory Visit: Payer: Self-pay | Admitting: Primary Care

## 2016-03-08 DIAGNOSIS — E119 Type 2 diabetes mellitus without complications: Secondary | ICD-10-CM

## 2016-03-08 MED ORDER — CANAGLIFLOZIN 300 MG PO TABS: 300.0000 mg | ORAL_TABLET | Freq: Every day | ORAL | Status: DC

## 2016-03-08 NOTE — Telephone Encounter (Signed)
Received refill faxed request for canagliflozin (INVOKANA) 300 MG TABS tablet.  Last prescribed on 10/24/2015. Last seen on 11/29/2015. CPE on 04/18/2016.

## 2016-04-02 ENCOUNTER — Other Ambulatory Visit: Payer: Self-pay | Admitting: Primary Care

## 2016-04-02 DIAGNOSIS — E119 Type 2 diabetes mellitus without complications: Secondary | ICD-10-CM

## 2016-04-11 ENCOUNTER — Other Ambulatory Visit (INDEPENDENT_AMBULATORY_CARE_PROVIDER_SITE_OTHER): Payer: BLUE CROSS/BLUE SHIELD

## 2016-04-11 DIAGNOSIS — E119 Type 2 diabetes mellitus without complications: Secondary | ICD-10-CM | POA: Diagnosis not present

## 2016-04-11 LAB — COMPREHENSIVE METABOLIC PANEL
ALK PHOS: 68 U/L (ref 39–117)
ALT: 15 U/L (ref 0–35)
AST: 16 U/L (ref 0–37)
Albumin: 4.4 g/dL (ref 3.5–5.2)
BUN: 23 mg/dL (ref 6–23)
CO2: 28 meq/L (ref 19–32)
Calcium: 9.8 mg/dL (ref 8.4–10.5)
Chloride: 104 mEq/L (ref 96–112)
Creatinine, Ser: 0.92 mg/dL (ref 0.40–1.20)
GFR: 66.56 mL/min (ref >60.00)
GLUCOSE: 164 mg/dL — AB (ref 70–99)
POTASSIUM: 4.6 meq/L (ref 3.5–5.1)
SODIUM: 138 meq/L (ref 135–145)
TOTAL PROTEIN: 7.5 g/dL (ref 6.0–8.3)
Total Bilirubin: 0.5 mg/dL (ref 0.2–1.2)

## 2016-04-11 LAB — HEMOGLOBIN A1C: Hgb A1c MFr Bld: 7.3 % — ABNORMAL HIGH (ref 4.6–6.5)

## 2016-04-11 LAB — MICROALBUMIN / CREATININE URINE RATIO
CREATININE, U: 96.7 mg/dL
MICROALB/CREAT RATIO: 0.7 mg/g (ref 0.0–30.0)
Microalb, Ur: <0.7 mg/dL (ref 0.0–1.9)

## 2016-04-18 ENCOUNTER — Encounter: Payer: Self-pay | Admitting: Primary Care

## 2016-04-18 ENCOUNTER — Ambulatory Visit (INDEPENDENT_AMBULATORY_CARE_PROVIDER_SITE_OTHER): Payer: BLUE CROSS/BLUE SHIELD | Admitting: Primary Care

## 2016-04-18 VITALS — BP 114/76 | HR 94 | Temp 97.7°F | Ht 65.0 in | Wt 139.0 lb

## 2016-04-18 DIAGNOSIS — K219 Gastro-esophageal reflux disease without esophagitis: Secondary | ICD-10-CM | POA: Diagnosis not present

## 2016-04-18 DIAGNOSIS — Z1239 Encounter for other screening for malignant neoplasm of breast: Secondary | ICD-10-CM | POA: Diagnosis not present

## 2016-04-18 DIAGNOSIS — E785 Hyperlipidemia, unspecified: Secondary | ICD-10-CM

## 2016-04-18 DIAGNOSIS — Z0001 Encounter for general adult medical examination with abnormal findings: Secondary | ICD-10-CM | POA: Insufficient documentation

## 2016-04-18 DIAGNOSIS — E119 Type 2 diabetes mellitus without complications: Secondary | ICD-10-CM | POA: Diagnosis not present

## 2016-04-18 DIAGNOSIS — Z23 Encounter for immunization: Secondary | ICD-10-CM | POA: Diagnosis not present

## 2016-04-18 DIAGNOSIS — Z0000 Encounter for general adult medical examination without abnormal findings: Secondary | ICD-10-CM | POA: Diagnosis not present

## 2016-04-18 NOTE — Assessment & Plan Note (Signed)
Lipids in February stable on statin. Continue simvastatin. LFT's unremarkable.

## 2016-04-18 NOTE — Assessment & Plan Note (Signed)
Tetanus and pneumonia due, provided today. Mammogram due, orders placed. Overall fair diet. Discussed to increase fresh/steamed vegetables, fruit, whole grains, lean protein. She will start exercising. Labs with stable A1C, otherwise unremarkable. Exam unremarkable. Follow up in 1 year for repeat physical.

## 2016-04-18 NOTE — Assessment & Plan Note (Signed)
Stable on omeprazole 20mg. Continue same.  

## 2016-04-18 NOTE — Patient Instructions (Signed)
Your A1C is stable at 7.3. I would like to see this below 7 eventually. Continue to work on improvements in diet.   Start exercising. You should be getting 1 hour of moderate intensity exercise 3-5 days weekly.  You were provided with a tetanus and pneumonia vaccination. Tetanus will cover you for 10 years. We will provide you with another pneumonia vaccination at age 60.  You will be contacted regarding your Mammogram.  Please call their office if you have not heard back within one week.   Follow up in 6 months for re-evaluation of diabetes and high blood pressure.  It was a pleasure to see you today!  Diabetes Mellitus and Food It is important for you to manage your blood sugar (glucose) level. Your blood glucose level can be greatly affected by what you eat. Eating healthier foods in the appropriate amounts throughout the day at about the same time each day will help you control your blood glucose level. It can also help slow or prevent worsening of your diabetes mellitus. Healthy eating may even help you improve the level of your blood pressure and reach or maintain a healthy weight.  General recommendations for healthful eating and cooking habits include:  Eating meals and snacks regularly. Avoid going long periods of time without eating to lose weight.  Eating a diet that consists mainly of plant-based foods, such as fruits, vegetables, nuts, legumes, and whole grains.  Using low-heat cooking methods, such as baking, instead of high-heat cooking methods, such as deep frying. Work with your dietitian to make sure you understand how to use the Nutrition Facts information on food labels. HOW CAN FOOD AFFECT ME? Carbohydrates Carbohydrates affect your blood glucose level more than any other type of food. Your dietitian will help you determine how many carbohydrates to eat at each meal and teach you how to count carbohydrates. Counting carbohydrates is important to keep your blood glucose  at a healthy level, especially if you are using insulin or taking certain medicines for diabetes mellitus. Alcohol Alcohol can cause sudden decreases in blood glucose (hypoglycemia), especially if you use insulin or take certain medicines for diabetes mellitus. Hypoglycemia can be a life-threatening condition. Symptoms of hypoglycemia (sleepiness, dizziness, and disorientation) are similar to symptoms of having too much alcohol.  If your health care provider has given you approval to drink alcohol, do so in moderation and use the following guidelines:  Women should not have more than one drink per day, and men should not have more than two drinks per day. One drink is equal to:  12 oz of beer.  5 oz of wine.  1 oz of hard liquor.  Do not drink on an empty stomach.  Keep yourself hydrated. Have water, diet soda, or unsweetened iced tea.  Regular soda, juice, and other mixers might contain a lot of carbohydrates and should be counted. WHAT FOODS ARE NOT RECOMMENDED? As you make food choices, it is important to remember that all foods are not the same. Some foods have fewer nutrients per serving than other foods, even though they might have the same number of calories or carbohydrates. It is difficult to get your body what it needs when you eat foods with fewer nutrients. Examples of foods that you should avoid that are high in calories and carbohydrates but low in nutrients include:  Trans fats (most processed foods list trans fats on the Nutrition Facts label).  Regular soda.  Juice.  Candy.  Sweets, such as cake,  pie, doughnuts, and cookies.  Fried foods. WHAT FOODS CAN I EAT? Eat nutrient-rich foods, which will nourish your body and keep you healthy. The food you should eat also will depend on several factors, including:  The calories you need.  The medicines you take.  Your weight.  Your blood glucose level.  Your blood pressure level.  Your cholesterol level. You  should eat a variety of foods, including:  Protein.  Lean cuts of meat.  Proteins low in saturated fats, such as fish, egg whites, and beans. Avoid processed meats.  Fruits and vegetables.  Fruits and vegetables that may help control blood glucose levels, such as apples, mangoes, and yams.  Dairy products.  Choose fat-free or low-fat dairy products, such as milk, yogurt, and cheese.  Grains, bread, pasta, and rice.  Choose whole grain products, such as multigrain bread, whole oats, and brown rice. These foods may help control blood pressure.  Fats.  Foods containing healthful fats, such as nuts, avocado, olive oil, canola oil, and fish. DOES EVERYONE WITH DIABETES MELLITUS HAVE THE SAME MEAL PLAN? Because every person with diabetes mellitus is different, there is not one meal plan that works for everyone. It is very important that you meet with a dietitian who will help you create a meal plan that is just right for you.   This information is not intended to replace advice given to you by your health care provider. Make sure you discuss any questions you have with your health care provider.   Document Released: 05/23/2005 Document Revised: 09/16/2014 Document Reviewed: 07/23/2013 Elsevier Interactive Patient Education [employer].

## 2016-04-18 NOTE — Addendum Note (Signed)
Addended by: Jacqualin Combes on: 04/18/2016 09:30 AM   Modules accepted: Orders

## 2016-04-18 NOTE — Progress Notes (Signed)
Subjective:    Patient ID: Miranda Greene, female    DOB: [birth year], 58 y.o.   MRN: [id number]  HPI  Miranda Greene is a 60 year old female who presents today for complete physical.  Immunizations: -Tetanus: Unsure, believes it's been 10 years.  -Influenza: Did not receive last season. -Pneumonia: Never completed  Diet: She endorses a fair diet. Breakfast: Cereal Lunch: Vegetables, hot dog Dinner: Chicken, beef, vegetables, salad, potato Snacks: Slaw, salad, cheese Desserts: None Beverages: Water, coffee, occasional unsweet tea  Exercise: She does not currently exercise. Eye exam: Due this year. Dental exam: Has not completed in a while. Colonoscopy: Interested in Cologuard Pap Smear: Hysterectomy Mammogram: Has not completed in several years.   Review of Systems  Constitutional: Negative for unexpected weight change.  HENT: Negative for rhinorrhea.   Respiratory: Negative for cough and shortness of breath.   Cardiovascular: Negative for chest pain.  Gastrointestinal: Negative for constipation and diarrhea.  Genitourinary: Negative for difficulty urinating.  Musculoskeletal: Positive for arthralgias. Negative for myalgias.       Chronic left knee pain, following with orthopedics  Skin: Negative for rash.  Allergic/Immunologic: Positive for environmental allergies.  Neurological: Positive for numbness. Negative for dizziness and headaches.       Numbness to right foot  Psychiatric/Behavioral:       Denies concerns for anxiety and depression       Past Medical History:  Diagnosis Date  . Arthritis    Hands, feet  . Diabetes mellitus   . GERD (gastroesophageal reflux disease)   . Hyperlipidemia   . Lupus [facility])      Social History   Social History  . Marital status: Married    Spouse name: N/A  . Number of children: N/A  . Years of education: N/A   Occupational History  . Not on file.   Social History Main Topics  . Smoking status: Never Smoker  .  Smokeless tobacco: Not on file  . Alcohol use No  . Drug use: No  . Sexual activity: Not on file   Other Topics Concern  . Not on file   Social History Narrative   Married.   4 children.    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    Past Surgical History:  Procedure Laterality Date  . ABDOMINAL HYSTERECTOMY    . BLADDER SURGERY    . BREAST LUMPECTOMY     Benign    Family History  Problem Relation Age of Onset  . Arthritis Mother   . Stroke Mother   . Kidney disease Mother   . Diabetes Mother   . Dementia Mother   . Alcohol abuse Father   . Lung cancer Father   . Heart disease Father   . Stroke Maternal Grandmother   . Arthritis Paternal Grandmother   . Heart disease Paternal Grandmother   . Diabetes Paternal Grandmother     Allergies  Allergen Reactions  . Meloxicam Hives    Current Outpatient Prescriptions on File Prior to Visit  Medication Sig Dispense Refill  . canagliflozin (INVOKANA) 300 MG TABS tablet Take 1 tablet (300 mg total) by mouth daily before breakfast. 30 tablet 1  . glipiZIDE (GLUCOTROL) 10 MG tablet Take 10 mg by mouth 2 (two) times daily before a meal.    . omeprazole (PRILOSEC) 20 MG capsule Take 20 mg by mouth daily.    Marland Kitchen  Saxagliptin-Metformin (KOMBIGLYZE XR) 01-999 MG TB24 Take 1 tablet by mouth daily. 30 tablet 5  . simvastatin (ZOCOR) 20 MG tablet Take 1 tablet (20 mg total) by mouth at bedtime. 90 tablet 3  . ibuprofen (ADVIL,MOTRIN) 200 MG tablet Take 200 mg by mouth every 6 (six) hours as needed.     No current facility-administered medications on file prior to visit.     BP 114/76   Pulse 94   Temp 97.7 F (36.5 C) (Oral)   Ht 5\' 5"  (1.651 m)   Wt 139 lb (63 kg)   SpO2 96%   BMI 23.13 kg/m    Objective:   Physical Exam  Constitutional: She is oriented to person, place, and time. She appears well-nourished.  HENT:  Right Ear: Tympanic  membrane and ear canal normal.  Left Ear: Tympanic membrane and ear canal normal.  Nose: Nose normal.  Mouth/Throat: Oropharynx is clear and moist.  Eyes: Conjunctivae and EOM are normal. Pupils are equal, round, and reactive to light.  Neck: Neck supple. No thyromegaly present.  Cardiovascular: Normal rate and regular rhythm.   No murmur heard. Pulmonary/Chest: Effort normal and breath sounds normal. She has no rales.  Abdominal: Soft. Bowel sounds are normal. There is no tenderness.  Musculoskeletal: Normal range of motion.  Lymphadenopathy:    She has no cervical adenopathy.  Neurological: She is alert and oriented to person, place, and time. She has normal reflexes. No cranial nerve deficit.  Skin: Skin is warm and dry. No rash noted.  Psychiatric: She has a normal mood and affect.          Assessment & Plan:

## 2016-04-18 NOTE — Assessment & Plan Note (Addendum)
A1C of 7.3, stable. Continue saxagliptin-metformin, invokana, glipizide. Foot exam unremarkable today. Managed on statin. Pneumovax provided today. Urine microalbumin unremarkable. Continue to monitor, recheck in 6 months.

## 2016-04-18 NOTE — Progress Notes (Signed)
Pre visit review using our clinic review tool, if applicable. No additional management support is needed unless otherwise documented below in the visit note. 

## 2016-05-14 ENCOUNTER — Other Ambulatory Visit: Payer: Self-pay | Admitting: Primary Care

## 2016-05-14 DIAGNOSIS — E119 Type 2 diabetes mellitus without complications: Secondary | ICD-10-CM

## 2016-08-16 ENCOUNTER — Other Ambulatory Visit: Payer: Self-pay | Admitting: Primary Care

## 2016-08-16 DIAGNOSIS — E119 Type 2 diabetes mellitus without complications: Secondary | ICD-10-CM

## 2016-08-16 NOTE — Telephone Encounter (Signed)
Ok to refill? Electronically refill request for   Saxagliptin-Metformin (KOMBIGLYZE XR) 01-999 MG TB24

## 2016-09-24 LAB — HM DIABETES EYE EXAM

## 2016-10-24 ENCOUNTER — Ambulatory Visit: Payer: BLUE CROSS/BLUE SHIELD | Admitting: Primary Care

## 2016-11-15 ENCOUNTER — Ambulatory Visit (INDEPENDENT_AMBULATORY_CARE_PROVIDER_SITE_OTHER): Payer: BLUE CROSS/BLUE SHIELD | Admitting: Primary Care

## 2016-11-15 ENCOUNTER — Encounter: Payer: Self-pay | Admitting: Primary Care

## 2016-11-15 ENCOUNTER — Encounter (INDEPENDENT_AMBULATORY_CARE_PROVIDER_SITE_OTHER): Payer: Self-pay

## 2016-11-15 ENCOUNTER — Telehealth: Payer: Self-pay | Admitting: Primary Care

## 2016-11-15 VITALS — BP 118/76 | HR 66 | Temp 97.5°F | Ht 65.0 in | Wt 138.0 lb

## 2016-11-15 DIAGNOSIS — E785 Hyperlipidemia, unspecified: Secondary | ICD-10-CM

## 2016-11-15 DIAGNOSIS — N898 Other specified noninflammatory disorders of vagina: Secondary | ICD-10-CM

## 2016-11-15 DIAGNOSIS — E119 Type 2 diabetes mellitus without complications: Secondary | ICD-10-CM | POA: Diagnosis not present

## 2016-11-15 DIAGNOSIS — L298 Other pruritus: Secondary | ICD-10-CM | POA: Diagnosis not present

## 2016-11-15 LAB — LIPID PANEL
Cholesterol: 174 mg/dL (ref 0–200)
HDL: 36.3 mg/dL — ABNORMAL LOW (ref >39.00)
LDL Cholesterol: 109 mg/dL — ABNORMAL HIGH (ref 0–99)
NONHDL: 137.98
TRIGLYCERIDES: 144 mg/dL (ref 0.0–149.0)
Total CHOL/HDL Ratio: 5
VLDL: 28.8 mg/dL (ref 0.0–40.0)

## 2016-11-15 LAB — HEMOGLOBIN A1C: Hgb A1c MFr Bld: 7.4 % — ABNORMAL HIGH (ref 4.6–6.5)

## 2016-11-15 NOTE — Assessment & Plan Note (Signed)
Compliant to medications, A1C pending. Discussed to start regular exercise.

## 2016-11-15 NOTE — Progress Notes (Signed)
Pre visit review using our clinic review tool, if applicable. No additional management support is needed unless otherwise documented below in the visit note. 

## 2016-11-15 NOTE — Telephone Encounter (Signed)
Patient returned Chan's call. °

## 2016-11-15 NOTE — Patient Instructions (Signed)
Complete lab work prior to leaving today. I will notify you of your results once received.   Continue to work on a healthy diet.   Start exercising. You should be getting 150 minutes of moderate intensity exercise weekly.  Follow up in 6 months for your annual physical.  It was a pleasure to see you today!

## 2016-11-15 NOTE — Assessment & Plan Note (Signed)
Non compliant to Zocor for the past 3 months. Discussed the importance of compliance in prevention of heart attack/stroke. Repeat lipids today.

## 2016-11-15 NOTE — Addendum Note (Signed)
Addended by: Jacqualin Combes on: 11/15/2016 07:45 AM   Modules accepted: Orders

## 2016-11-15 NOTE — Progress Notes (Signed)
Subjective:    Patient ID: Miranda Greene, female    DOB: 03-Jan-1956, 61 y.o.   MRN: [id number]  HPI  Miranda Greene is a 61 year old female who presents today for follow up.  1) Type 2 Diabetes: Currently managed on Kombiglyze XR 01-999 mg, Invokana 300 mg, and glipizide 10 mg BID. Her last A1C was 7.3 six months ago, she is due for repeat A1C today. She is working on a healthy diet by increasing vegetables, limiting sweets. She does not routinely exercise, but is active at work. She denies numbness/tingling, chest pain, dizziness.   2) Hyperlipidemia: Currently managed on Zocor 20 mg. Lipid panel from 1 year ago stable, she is due for repeat lipids today. She's not had her cholesterol medication in three months as she did not refill her medication.   3) Vaginal Itching: She also reports vaginal discharge. Her symptoms began two weeks ago. She's been using OTC Vagisil cream with temporary improvement. She denies fevers, pelvic pain, urinary frequency, hematuria, dysuria.  Review of Systems  Constitutional: Negative for fever.  Gastrointestinal: Negative for abdominal pain.  Genitourinary: Positive for vaginal discharge. Negative for dysuria, flank pain, frequency, hematuria and pelvic pain.       Vaginal itching  Neurological: Negative for dizziness, numbness and headaches.       Past Medical History:  Diagnosis Date  . Arthritis    Hands, feet  . Diabetes mellitus   . GERD (gastroesophageal reflux disease)   . Hyperlipidemia   . Lupus      Social History   Social History  . Marital status: Married    Spouse name: N/A  . Number of children: N/A  . Years of education: N/A   Occupational History  . Not on file.   Social History Main Topics  . Smoking status: Never Smoker  . Smokeless tobacco: Never Used  . Alcohol use No  . Drug use: No  . Sexual activity: Not on file   Other Topics Concern  . Not on file   Social History Narrative   Married.   4 children.    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    Past Surgical History:  Procedure Laterality Date  . ABDOMINAL HYSTERECTOMY    . BLADDER SURGERY    . BREAST LUMPECTOMY     Benign    Family History  Problem Relation Age of Onset  . Arthritis Mother   . Stroke Mother   . Kidney disease Mother   . Diabetes Mother   . Dementia Mother   . Alcohol abuse Father   . Lung cancer Father   . Heart disease Father   . Stroke Maternal Grandmother   . Arthritis Paternal Grandmother   . Heart disease Paternal Grandmother   . Diabetes Paternal Grandmother     Allergies  Allergen Reactions  . Meloxicam Hives    Current Outpatient Prescriptions on File Prior to Visit  Medication Sig Dispense Refill  . glipiZIDE (GLUCOTROL) 10 MG tablet Take 10 mg by mouth 2 (two) times daily before a meal.    . ibuprofen (ADVIL,MOTRIN) 200 MG tablet Take 200 mg by mouth every 6 (six) hours as needed.    . INVOKANA 300 MG TABS tablet Take 1 tablet (300 mg total) by mouth daily before breakfast. 30 tablet 11  . KOMBIGLYZE XR 01-999 MG TB24 TAKE 1 TABLET BY MOUTH EVERY DAY  30 tablet 5  . omeprazole (PRILOSEC) 20 MG capsule Take 20 mg by mouth daily.    . simvastatin (ZOCOR) 20 MG tablet Take 1 tablet (20 mg total) by mouth at bedtime. 90 tablet 3   No current facility-administered medications on file prior to visit.     BP 118/76   Pulse 66   Temp 97.5 F (36.4 C) (Oral)   Ht 5\' 5"  (1.651 m)   Wt 138 lb (62.6 kg)   SpO2 97%   BMI 22.96 kg/m    Objective:   Physical Exam  Neck: Neck supple.  Cardiovascular: Normal rate and regular rhythm.   Pulmonary/Chest: Effort normal and breath sounds normal.  Genitourinary: There is no tenderness or lesion on the right labia. There is no tenderness or lesion on the left labia. Right adnexum displays no tenderness. Left adnexum displays no tenderness. There is erythema in the vagina.  Vaginal discharge found.  Skin: Skin is warm and dry.          Assessment & Plan:  Vaginal Itching:  Present for the past 2 weeks, also with discharge. Temporary improvement with OTC treatment. Exam today with mild whitish discharge, no lesions. Wet prep sent off and pending. Will await results.  Sheral Flow, NP

## 2016-11-16 ENCOUNTER — Other Ambulatory Visit: Payer: Self-pay | Admitting: Primary Care

## 2016-11-16 DIAGNOSIS — N760 Acute vaginitis: Principal | ICD-10-CM

## 2016-11-16 DIAGNOSIS — B9689 Other specified bacterial agents as the cause of diseases classified elsewhere: Secondary | ICD-10-CM

## 2016-11-16 LAB — WET PREP BY MOLECULAR PROBE
CANDIDA SPECIES: NOT DETECTED
Gardnerella vaginalis: DETECTED — AB
Trichomonas vaginosis: NOT DETECTED

## 2016-11-16 MED ORDER — METRONIDAZOLE 500 MG PO TABS: 500.0000 mg | ORAL_TABLET | Freq: Two times a day (BID) | ORAL | 0 refills | Status: DC

## 2016-11-19 NOTE — Telephone Encounter (Signed)
Spoken and notified patient of Kate's comments. Patient verbalized understanding. 

## 2016-11-22 ENCOUNTER — Telehealth: Payer: Self-pay

## 2016-11-22 ENCOUNTER — Encounter: Payer: Self-pay | Admitting: Primary Care

## 2016-11-22 DIAGNOSIS — N898 Other specified noninflammatory disorders of vagina: Secondary | ICD-10-CM

## 2016-11-22 MED ORDER — CLOBETASOL PROPIONATE 0.05 % EX CREA: TOPICAL_CREAM | CUTANEOUS | 0 refills | Status: DC

## 2016-11-22 NOTE — Telephone Encounter (Signed)
Please notify patient that I sent in a prescription for steroid cream called clobetasol cream for the vaginal itching. Apply small amount to external vagina once daily for 1 week. Wash hands after use, do not put on face.

## 2016-11-22 NOTE — Telephone Encounter (Signed)
Spoken and notified patient of Kate's comments. Patient verbalized understanding. 

## 2016-11-22 NOTE — Telephone Encounter (Signed)
Pt left a message on triage saying she is still having a lot of vaginal itching after her OV last week. She is on her 2nd bottle of Vagisil. Wonders if there is a prescription that would be better. Would like it called in to [place]. [facility]. Please call if done.

## 2016-11-25 NOTE — Telephone Encounter (Signed)
Spoken to patient and notified patient that Miranda Greene sent the cream to [facility]. Patient stated that it is okay and she will pick it up during her lunch break.

## 2017-01-22 ENCOUNTER — Ambulatory Visit (HOSPITAL_COMMUNITY)
Admission: EM | Admit: 2017-01-22 | Discharge: 2017-01-22 | Disposition: A | Discharge status: Home / Self Care | Payer: BLUE CROSS/BLUE SHIELD | Attending: Family Medicine | Admitting: Family Medicine

## 2017-01-22 ENCOUNTER — Encounter (HOSPITAL_COMMUNITY): Payer: Self-pay | Admitting: Family Medicine

## 2017-01-22 DIAGNOSIS — S20361A Insect bite (nonvenomous) of right front wall of thorax, initial encounter: Secondary | ICD-10-CM | POA: Diagnosis not present

## 2017-01-22 DIAGNOSIS — W57XXXA Bitten or stung by nonvenomous insect and other nonvenomous arthropods, initial encounter: Secondary | ICD-10-CM | POA: Diagnosis not present

## 2017-01-22 MED ORDER — DOXYCYCLINE HYCLATE 100 MG PO CAPS: 100.0000 mg | ORAL_CAPSULE | Freq: Two times a day (BID) | ORAL | 0 refills | Status: DC

## 2017-01-22 NOTE — ED Provider Notes (Signed)
CSN: [id number]     Arrival date & time 01/22/17  1847 History   None    Chief Complaint  Patient presents with  . Insect Bite   (Consider location/radiation/quality/duration/timing/severity/associated sxs/prior Treatment) Patient c/o tick bite right lateral chest.  She c/o not feeling well.  She was bitten on the leg a week ago.  She reports she is not sure how long the tick was embedded.    Rash  Location:  Torso Quality: itchiness and redness   Severity:  Mild Onset quality:  Sudden Duration:  1 day Timing:  Constant Chronicity:  New Context: insect bite/sting   Relieved by:  Nothing Worsened by:  Nothing Ineffective treatments:  None tried   Past Medical History:  Diagnosis Date  . Arthritis    Hands, feet  . Diabetes mellitus   . GERD (gastroesophageal reflux disease)   . Hyperlipidemia   . Lupus    Past Surgical History:  Procedure Laterality Date  . ABDOMINAL HYSTERECTOMY    . BLADDER SURGERY    . BREAST LUMPECTOMY     Benign   Family History  Problem Relation Age of Onset  . Alcohol abuse Father   . Lung cancer Father   . Heart disease Father   . Stroke Maternal Grandmother   . Arthritis Paternal Grandmother   . Heart disease Paternal Grandmother   . Diabetes Paternal Grandmother   . Arthritis Mother   . Stroke Mother   . Kidney disease Mother   . Diabetes Mother   . Dementia Mother    Social History  Substance Use Topics  . Smoking status: Never Smoker  . Smokeless tobacco: Never Used  . Alcohol use No   OB History    No data available     Review of Systems  Constitutional: Negative.   HENT: Negative.   Eyes: Negative.   Respiratory: Negative.   Cardiovascular: Negative.   Gastrointestinal: Negative.   Endocrine: Negative.   Genitourinary: Negative.   Musculoskeletal: Negative.   Skin: Positive for rash.  Allergic/Immunologic: Negative.   Neurological: Negative.   Hematological: Negative.   Psychiatric/Behavioral: Negative.      Allergies  Meloxicam  Home Medications   Prior to Admission medications   Medication Sig Start Date End Date Taking? Authorizing Provider  clobetasol cream (TEMOVATE) 0.05 % Apply to external vagina once daily for itching. 11/22/16   Pleas Koch, NP  doxycycline (VIBRAMYCIN) 100 MG capsule Take 1 capsule (100 mg total) by mouth 2 (two) times daily. 01/22/17   Lysbeth Penner, FNP  glipiZIDE (GLUCOTROL) 10 MG tablet Take 10 mg by mouth 2 (two) times daily before a meal.    [provider]  ibuprofen (ADVIL,MOTRIN) 200 MG tablet Take 200 mg by mouth every 6 (six) hours as needed.    [provider]  INVOKANA 300 MG TABS tablet Take 1 tablet (300 mg total) by mouth daily before breakfast. 05/14/16   Pleas Koch, NP  KOMBIGLYZE XR 01-999 MG TB24 TAKE 1 TABLET BY MOUTH EVERY DAY 08/16/16   Pleas Koch, NP  metroNIDAZOLE (FLAGYL) 500 MG tablet Take 1 tablet (500 mg total) by mouth 2 (two) times daily. 11/16/16   Pleas Koch, NP  omeprazole (PRILOSEC) 20 MG capsule Take 20 mg by mouth daily.    [provider]  simvastatin (ZOCOR) 20 MG tablet Take 1 tablet (20 mg total) by mouth at bedtime. 06/15/15   Pleas Koch, NP   Meds Ordered and  Administered this Visit  Medications - No data to display  BP (!) 137/93   Pulse 77   Temp 98 F (36.7 C)   Resp 18   SpO2 98%  No data found.   Physical Exam  Constitutional: She appears well-developed and well-nourished.  HENT:  Head: Normocephalic and atraumatic.  Eyes: Conjunctivae and EOM are normal. Pupils are equal, round, and reactive to light.  Neck: Normal range of motion. Neck supple.  Cardiovascular: Normal rate, regular rhythm and normal heart sounds.   Pulmonary/Chest: Effort normal and breath sounds normal.  Abdominal: Soft. Bowel sounds are normal.  Skin: Rash noted.  Erythema right lateral chest approx 2 cm diameter.  Nursing note and vitals reviewed.   Urgent Care  Course     Procedures (including critical care time)  Labs Review Labs Reviewed - No data to display  Imaging Review No results found.   Visual Acuity Review  Right Eye Distance:   Left Eye Distance:   Bilateral Distance:    Right Eye Near:   Left Eye Near:    Bilateral Near:         MDM   1. Tick bite, initial encounter    Doxycycline 100mg  one po bid x 10 days #20      Lysbeth Penner, [place] 01/22/17 1941

## 2017-01-22 NOTE — ED Triage Notes (Signed)
Pt here for tick bite to right side. sts hurting into ribs and not feeling well. sts also that she was bit on the leg a week ago.

## 2017-02-05 ENCOUNTER — Ambulatory Visit (INDEPENDENT_AMBULATORY_CARE_PROVIDER_SITE_OTHER): Payer: BLUE CROSS/BLUE SHIELD | Admitting: Internal Medicine

## 2017-02-05 ENCOUNTER — Encounter: Payer: Self-pay | Admitting: Internal Medicine

## 2017-02-05 VITALS — BP 120/76 | HR 88 | Temp 97.8°F | Wt 138.0 lb

## 2017-02-05 DIAGNOSIS — W57XXXA Bitten or stung by nonvenomous insect and other nonvenomous arthropods, initial encounter: Secondary | ICD-10-CM

## 2017-02-05 DIAGNOSIS — R6889 Other general symptoms and signs: Secondary | ICD-10-CM

## 2017-02-05 DIAGNOSIS — R5383 Other fatigue: Secondary | ICD-10-CM

## 2017-02-05 DIAGNOSIS — R070 Pain in throat: Secondary | ICD-10-CM

## 2017-02-05 DIAGNOSIS — R51 Headache: Secondary | ICD-10-CM

## 2017-02-05 NOTE — Patient Instructions (Signed)
Tick Bite Information Introduction Ticks are insects that attach themselves to the skin. There are many types of ticks. Common types include wood ticks and deer ticks. Sometimes, ticks carry diseases that can make a person very ill. The most common places for ticks to attach themselves are the scalp, neck, armpits, waist, and groin. HOW CAN YOU PREVENT TICK BITES? Take these steps to help prevent tick bites when you are outdoors:  Wear long sleeves and long pants.  Wear white clothes so you can see ticks more easily.  Tuck your pant legs into your socks.  If walking on a trail, stay in the middle of the trail to avoid brushing against bushes.  Avoid walking through areas with long grass.  Put bug spray on all skin that is showing and along boot tops, pant legs, and sleeve cuffs.  Check clothes, hair, and skin often and before going inside.  Brush off any ticks that are not attached.  Take a shower or bath as soon as possible after being outdoors. HOW SHOULD YOU REMOVE A TICK? Ticks should be removed as soon as possible to help prevent diseases. 1. If latex gloves are available, put them on before trying to remove a tick. 2. Use tweezers to grasp the tick as close to the skin as possible. You may also use curved forceps or a tick removal tool. Grasp the tick as close to its head as possible. Avoid grasping the tick on its body. 3. Pull gently upward until the tick lets go. Do not twist the tick or jerk it suddenly. This may break off the tick's head or mouth parts. 4. Do not squeeze or crush the tick's body. This could force disease-carrying fluids from the tick into your body. 5. After the tick is removed, wash the bite area and your hands with soap and water or alcohol. 6. Apply a small amount of antiseptic cream or ointment to the bite site. 7. Wash any tools that were used. Do not try to remove a tick by applying a hot match, petroleum jelly, or fingernail polish to the tick. These  methods do not work. They may also increase the chances of disease being spread from the tick bite. WHEN SHOULD YOU SEEK HELP? Contact your health care provider if you are unable to remove a tick or if a part of the tick breaks off in the skin. After a tick bite, you need to watch for signs and symptoms of diseases that can be spread by ticks. Contact your health care provider if you develop any of the following:  Fever.  Rash.  Redness and puffiness (swelling) in the area of the tick bite.  Tender, puffy lymph glands.  Watery poop (diarrhea).  Weight loss.  Cough.  Feeling more tired than normal (fatigue).  Muscle, joint, or bone pain.  Belly (abdominal) pain.  Headache.  Change in your level of consciousness.  Trouble walking or moving your legs.  Loss of feeling (numbness) in the legs.  Loss of movement (paralysis).  Shortness of breath.  Confusion.  Throwing up (vomiting) many times. This information is not intended to replace advice given to you by your health care provider. Make sure you discuss any questions you have with your health care provider. Document Released: 11/20/2009 Document Revised: 02/01/2016 Document Reviewed: 02/03/2013 Elsevier Interactive Patient Education  2017 Elsevier Inc[employer]  

## 2017-02-05 NOTE — Progress Notes (Signed)
Subjective:    Patient ID: Miranda Greene, female    DOB: 1956/03/23, 61 y.o.   MRN: [id number]  HPI  Pt presents to the clinic today with c/o multiple tick bites. She went to the ER for the same, 5/16. She was treated with prophylactic Doxycyline x 10 days.  She now c/o headache, sore throat, fatigue and body aches. This started 4 days ago. She denies difficulty swallowing. She denies fever, rash, nausea, diarrhea. She has not taken anything OTC for her symptoms.  Review of Systems  Past Medical History:  Diagnosis Date  . Arthritis    Hands, feet  . Diabetes mellitus   . GERD (gastroesophageal reflux disease)   . Hyperlipidemia   . Lupus     Current Outpatient Prescriptions  Medication Sig Dispense Refill  . clobetasol cream (TEMOVATE) 0.05 % Apply to external vagina once daily for itching. 30 g 0  . doxycycline (VIBRAMYCIN) 100 MG capsule Take 1 capsule (100 mg total) by mouth 2 (two) times daily. 20 capsule 0  . glipiZIDE (GLUCOTROL) 10 MG tablet Take 10 mg by mouth 2 (two) times daily before a meal.    . ibuprofen (ADVIL,MOTRIN) 200 MG tablet Take 200 mg by mouth every 6 (six) hours as needed.    . INVOKANA 300 MG TABS tablet Take 1 tablet (300 mg total) by mouth daily before breakfast. 30 tablet 11  . KOMBIGLYZE XR 01-999 MG TB24 TAKE 1 TABLET BY MOUTH EVERY DAY 30 tablet 5  . metroNIDAZOLE (FLAGYL) 500 MG tablet Take 1 tablet (500 mg total) by mouth 2 (two) times daily. 14 tablet 0  . omeprazole (PRILOSEC) 20 MG capsule Take 20 mg by mouth daily.    . simvastatin (ZOCOR) 20 MG tablet Take 1 tablet (20 mg total) by mouth at bedtime. 90 tablet 3   No current facility-administered medications for this visit.     Allergies  Allergen Reactions  . Meloxicam Hives    Family History  Problem Relation Age of Onset  . Alcohol abuse Father   . Lung cancer Father   . Heart disease Father   . Stroke Maternal Grandmother   . Arthritis Paternal Grandmother   . Heart disease  Paternal Grandmother   . Diabetes Paternal Grandmother   . Arthritis Mother   . Stroke Mother   . Kidney disease Mother   . Diabetes Mother   . Dementia Mother     Social History   Social History  . Marital status: Married    Spouse name: N/A  . Number of children: N/A  . Years of education: N/A   Occupational History  . Not on file.   Social History Main Topics  . Smoking status: Never Smoker  . Smokeless tobacco: Never Used  . Alcohol use No  . Drug use: No  . Sexual activity: Not on file   Other Topics Concern  . Not on file   Social History Narrative   Married.   4 children.    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     Constitutional: Pt reports fatigue, headache. Denies fever, or abrupt weight changes.  HEENT: Pt reports sore throat. Denies eye pain, eye redness, ear pain, ringing in the ears, wax buildup, runny nose, nasal congestion, bloody nose. Respiratory: Denies difficulty breathing, shortness of breath, cough or sputum production.   Cardiovascular: Denies chest pain, chest tightness, palpitations or  swelling in the hands or feet.  Gastrointestinal: Denies abdominal pain, bloating, constipation, diarrhea or blood in the stool.  Musculoskeletal: Pt reports body aches. Denies decrease in range of motion, difficulty with gait, or joint pain and swelling.  Skin: Denies redness, rashes, lesions or ulcercations.  Neurological: Denies dizziness, difficulty with memory, difficulty with speech or problems with balance and coordination.    No other specific complaints in a complete review of systems (except as listed in HPI above).     Objective:   Physical Exam  BP 120/76   Pulse 88   Temp 97.8 F (36.6 C) (Oral)   Wt 138 lb (62.6 kg)   SpO2 97%   BMI 22.96 kg/m  Wt Readings from Last 3 Encounters:  02/05/17 138 lb (62.6 kg)  11/15/16 138 lb (62.6 kg)  04/18/16 139 lb  (63 kg)    General: Appears her stated age, well developed, well nourished in NAD. Skin: Warm, dry and intact. No rashes noted. HEENT: Throat/Mouth: Teeth present, mucosa pink and moist, no exudate, lesions or ulcerations noted.  Neck:  No adenopathy noted.  Cardiovascular: Normal rate and rhythm.  Pulmonary/Chest: Normal effort and positive vesicular breath sounds. No respiratory distress. No wheezes, rales or ronchi noted.  Abdomen: Soft and nontender. Normal bowel sounds.  Neurological: Alert and oriented.   BMET    Component Value Date/Time   NA 138 04/11/2016 0907   K 4.6 04/11/2016 0907   CL 104 04/11/2016 0907   CO2 28 04/11/2016 0907   GLUCOSE 164 (H) 04/11/2016 0907   BUN 23 04/11/2016 0907   CREATININE 0.92 04/11/2016 0907   CALCIUM 9.8 04/11/2016 0907    Lipid Panel     Component Value Date/Time   CHOL 174 11/15/2016 0748   TRIG 144.0 11/15/2016 0748   HDL 36.30 (L) 11/15/2016 0748   CHOLHDL 5 11/15/2016 0748   VLDL 28.8 11/15/2016 0748   LDLCALC 109 (H) 11/15/2016 0748    CBC    Component Value Date/Time   HGB 14.6 11/09/2011 1544   HCT 43.0 11/09/2011 1544    Hgb A1C Lab Results  Component Value Date   HGBA1C 7.4 (H) 11/15/2016            Assessment & Plan:   Multiple Tick Bites, Flu Like Symptoms:  ER note reviewed Discussed testing for tick born illness As she has been treated with prophylactic Doxy, will hold off for now Discussed symptomatic treatment, rest, fluids etc  Return precautions disscussed Webb Silversmith, NP

## 2017-03-07 ENCOUNTER — Other Ambulatory Visit: Payer: Self-pay | Admitting: Primary Care

## 2017-03-07 DIAGNOSIS — E119 Type 2 diabetes mellitus without complications: Secondary | ICD-10-CM

## 2017-04-24 ENCOUNTER — Encounter: Payer: Self-pay | Admitting: Primary Care

## 2017-04-24 ENCOUNTER — Ambulatory Visit (INDEPENDENT_AMBULATORY_CARE_PROVIDER_SITE_OTHER): Payer: BLUE CROSS/BLUE SHIELD | Admitting: Primary Care

## 2017-04-24 VITALS — BP 120/76 | HR 93 | Temp 98.1°F | Ht 65.0 in | Wt 136.0 lb

## 2017-04-24 DIAGNOSIS — Z1231 Encounter for screening mammogram for malignant neoplasm of breast: Secondary | ICD-10-CM

## 2017-04-24 DIAGNOSIS — J029 Acute pharyngitis, unspecified: Secondary | ICD-10-CM

## 2017-04-24 DIAGNOSIS — Z0000 Encounter for general adult medical examination without abnormal findings: Secondary | ICD-10-CM

## 2017-04-24 DIAGNOSIS — E785 Hyperlipidemia, unspecified: Secondary | ICD-10-CM | POA: Diagnosis not present

## 2017-04-24 DIAGNOSIS — E119 Type 2 diabetes mellitus without complications: Secondary | ICD-10-CM | POA: Diagnosis not present

## 2017-04-24 DIAGNOSIS — K219 Gastro-esophageal reflux disease without esophagitis: Secondary | ICD-10-CM | POA: Diagnosis not present

## 2017-04-24 DIAGNOSIS — Z1239 Encounter for other screening for malignant neoplasm of breast: Secondary | ICD-10-CM

## 2017-04-24 LAB — COMPREHENSIVE METABOLIC PANEL
ALBUMIN: 4.2 g/dL (ref 3.5–5.2)
ALK PHOS: 71 U/L (ref 39–117)
ALT: 12 U/L (ref 0–35)
AST: 15 U/L (ref 0–37)
BUN: 29 mg/dL — ABNORMAL HIGH (ref 6–23)
CHLORIDE: 103 meq/L (ref 96–112)
CO2: 29 mEq/L (ref 19–32)
Calcium: 9.5 mg/dL (ref 8.4–10.5)
Creatinine, Ser: 0.93 mg/dL (ref 0.40–1.20)
GFR: 65.5 mL/min (ref >60.00)
Glucose, Bld: 131 mg/dL — ABNORMAL HIGH (ref 70–99)
POTASSIUM: 4.5 meq/L (ref 3.5–5.1)
SODIUM: 137 meq/L (ref 135–145)
Total Bilirubin: 0.5 mg/dL (ref 0.2–1.2)
Total Protein: 7.1 g/dL (ref 6.0–8.3)

## 2017-04-24 LAB — LIPID PANEL
Cholesterol: 185 mg/dL (ref 0–200)
HDL: 36 mg/dL — AB (ref >39.00)
LDL CALC: 118 mg/dL — AB (ref 0–99)
NonHDL: 149.03
TRIGLYCERIDES: 153 mg/dL — AB (ref 0.0–149.0)
Total CHOL/HDL Ratio: 5
VLDL: 30.6 mg/dL (ref 0.0–40.0)

## 2017-04-24 LAB — HEMOGLOBIN A1C: Hgb A1c MFr Bld: 8.4 % — ABNORMAL HIGH (ref 4.6–6.5)

## 2017-04-24 LAB — MICROALBUMIN / CREATININE URINE RATIO
CREATININE, U: 120.6 mg/dL
MICROALB UR: 1 mg/dL (ref 0.0–1.9)
Microalb Creat Ratio: 0.8 mg/g (ref 0.0–30.0)

## 2017-04-24 LAB — POCT RAPID STREP A (OFFICE): Rapid Strep A Screen: NEGATIVE

## 2017-04-24 NOTE — Progress Notes (Signed)
Subjective:    Patient ID: Miranda Greene, female    DOB: 22-Nov-1955, 61 y.o.   MRN: [id number]  HPI  Miranda Greene is a 61 year old female who presents today for complete physical and also a chief complaint of sore throat.  1) Sore Throat: Present for the past three weeks. Soreness will rotate to left and right side. Did have a cough that has improved. She denies fevers, but thinks she may have had one in the past.  2) Dry/Cracked Mouth: Located to both corners of mouth. Also noticed a few small bumps to the upper left lip. She's tried chap stick without improvement. 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   Immunizations: -Tetanus: Completed in 2017 -Influenza: Due this season -Pneumonia: Completed in 2017   Diet: She endorses a fair diet. Breakfast: Cereal Lunch: Salad, left overs, restaurant food Dinner: Meat, vegetables, starch Snacks: Fruit, cheese and crackers Desserts: None Beverages: Flavored water, coffee  Exercise: She is not currently exercising Eye exam: Completed in January 2018 Dental exam: Has not completed in years.  Colonoscopy: Interested in [employer]. Never completed colonoscopy. Pap Smear: Hysterectomy Mammogram: Has not completed in       Review of Systems  Constitutional: Negative for unexpected weight change.  HENT: Negative for rhinorrhea.   Respiratory: Negative for cough and shortness of breath.   Cardiovascular: Negative for chest pain.  Gastrointestinal: Negative for constipation and diarrhea.  Genitourinary: Negative for difficulty urinating and menstrual problem.  Musculoskeletal: Negative for arthralgias and myalgias.  Skin: Negative for rash.  Allergic/Immunologic: Negative for environmental allergies.  Neurological: Negative for dizziness, numbness and headaches.  Psychiatric/Behavioral:       Denies concerns for anxiety and depression       Past Medical History:  Diagnosis Date  . Arthritis    Hands, feet  . Diabetes  mellitus   . GERD (gastroesophageal reflux disease)   . Hyperlipidemia   . Lupus      Social History   Social History  . Marital status: Married    Spouse name: N/A  . Number of children: N/A  . Years of education: N/A   Occupational History  . Not on file.   Social History Main Topics  . Smoking status: Never Smoker  . Smokeless tobacco: Never Used  . Alcohol use No  . Drug use: No  . Sexual activity: Not on file   Other Topics Concern  . Not on file   Social History Narrative   Married.   4 children.    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    Past Surgical History:  Procedure Laterality Date  . ABDOMINAL HYSTERECTOMY    . BLADDER SURGERY    . BREAST LUMPECTOMY     Benign    Family History  Problem Relation Age of Onset  . Alcohol abuse Father   . Lung cancer Father   . Heart disease Father   . Stroke Maternal Grandmother   . Arthritis Paternal Grandmother   . Heart disease Paternal Grandmother   . Diabetes Paternal Grandmother   . Arthritis Mother   . Stroke Mother   . Kidney disease Mother   . Diabetes Mother   . Dementia Mother     Allergies  Allergen Reactions  . Meloxicam Hives    Current Outpatient Prescriptions on File Prior to Visit  Medication Sig Dispense Refill  .  glipiZIDE (GLUCOTROL) 10 MG tablet Take 10 mg by mouth 2 (two) times daily before a meal.    . ibuprofen (ADVIL,MOTRIN) 200 MG tablet Take 200 mg by mouth every 6 (six) hours as needed.    . INVOKANA 300 MG TABS tablet Take 1 tablet (300 mg total) by mouth daily before breakfast. 30 tablet 11  . KOMBIGLYZE XR 01-999 MG TB24 TAKE 1 TABLET BY MOUTH EVERY DAY 30 tablet 5  . omeprazole (PRILOSEC) 20 MG capsule Take 20 mg by mouth daily.    . simvastatin (ZOCOR) 20 MG tablet Take 1 tablet (20 mg total) by mouth at bedtime. (Patient not taking: Reported on 02/05/2017) 90 tablet 3   No current  facility-administered medications on file prior to visit.     BP 120/76   Pulse 93   Temp 98.1 F (36.7 C) (Oral)   Ht 5\' 5"  (1.651 m)   Wt 136 lb (61.7 kg)   SpO2 98%   BMI 22.63 kg/m    Objective:   Physical Exam  Constitutional: She is oriented to person, place, and time. She appears well-nourished.  HENT:  Right Ear: Tympanic membrane and ear canal normal.  Left Ear: Tympanic membrane and ear canal normal.  Nose: Nose normal.  Mouth/Throat: Oropharynx is clear and moist. No oropharyngeal exudate.  Eyes: Pupils are equal, round, and reactive to light. Conjunctivae and EOM are normal.  Neck: Neck supple. No thyromegaly present.  Cardiovascular: Normal rate and regular rhythm.   No murmur heard. Pulmonary/Chest: Effort normal and breath sounds normal. She has no rales.  Abdominal: Soft. Bowel sounds are normal. There is no tenderness.  Musculoskeletal: Normal range of motion.  Lymphadenopathy:    She has no cervical adenopathy.  Neurological: She is alert and oriented to person, place, and time. She has normal reflexes. No cranial nerve deficit.  Skin: Skin is warm and dry. No rash noted.  Psychiatric: She has a normal mood and affect.          Assessment & Plan:  Sore Throat:  Present for 3 weeks. Exam today without evidence of infection. Rapid Strep: negative Suspect post nasal drip or stress to be cause.  Start antihistamine, warm salt gargles, stress reduction.  Herpes Labialis:  History of, no recent large outbreaks. Exam today without obvious lesions, but do believe this is part of the cause of her symptoms. Will have her try Abreva OTC. She will report back if no improvement.  Sheral Flow, NP

## 2017-04-24 NOTE — Addendum Note (Signed)
Addended by: Jacqualin Combes on: 04/24/2017 10:00 AM   Modules accepted: Orders

## 2017-04-24 NOTE — Patient Instructions (Signed)
Complete lab work prior to leaving today. I will notify you of your results once received.   You will be contacted regarding your mammogram.  Please let us know if you have not heard back within one week.   We will notify you of the Cologuard results once received.   Start exercising. You should be getting 150 minutes of moderate intensity exercise weekly.  Increase consumption of vegetables, fruit, whole grains, water.  Ensure you are consuming 64 ounces of water daily.  Follow up in 6 months for re-evaluation of diabetes.  It was a pleasure to see you today!

## 2017-04-24 NOTE — Assessment & Plan Note (Signed)
A1C pending. Pneumonia vaccination UTD. Not taking statin. Urine microalbumin pending. Foot exam next visit. Continue current medications for now. Will await lab results.

## 2017-04-24 NOTE — Assessment & Plan Note (Signed)
Stable on omeprazole 20 mg, continue same. 

## 2017-04-24 NOTE — Assessment & Plan Note (Signed)
Immunizations UTD. Mammogram due, ordered and pending. Colonoscopy due, opts for Cologuard. Patient was signed up today. Discussed the importance of a healthy diet and regular exercise in order to reduce the risk of other medical problems. Exam unremarkable. Labs pending. Follow up in 1 year for annual exam.

## 2017-04-24 NOTE — Assessment & Plan Note (Signed)
Not taking statin. Previous ASCVD risk score of 6%, new labs pending. If risk score above 10% then will have her resume statin.

## 2017-04-25 MED ORDER — INSULIN GLARGINE 100 UNIT/ML SOLOSTAR PEN: 10.0000 [IU] | PEN_INJECTOR | Freq: Every day | SUBCUTANEOUS | 11 refills | Status: DC

## 2017-05-07 ENCOUNTER — Encounter: Payer: Self-pay | Admitting: Primary Care

## 2017-05-07 DIAGNOSIS — Z1212 Encounter for screening for malignant neoplasm of rectum: Secondary | ICD-10-CM | POA: Diagnosis not present

## 2017-05-07 DIAGNOSIS — Z1211 Encounter for screening for malignant neoplasm of colon: Secondary | ICD-10-CM | POA: Diagnosis not present

## 2017-05-07 LAB — COLOGUARD: COLOGUARD: NEGATIVE

## 2017-05-15 ENCOUNTER — Telehealth: Payer: Self-pay | Admitting: Primary Care

## 2017-05-15 NOTE — Telephone Encounter (Signed)
Please notify patient that her Cologuard result was negative. We will repeat this in 3 years.

## 2017-05-16 NOTE — Telephone Encounter (Signed)
Sending letter with results and Kate's comments for patient. 

## 2017-05-19 ENCOUNTER — Other Ambulatory Visit: Payer: Self-pay | Admitting: Primary Care

## 2017-05-19 DIAGNOSIS — E119 Type 2 diabetes mellitus without complications: Secondary | ICD-10-CM

## 2017-10-06 ENCOUNTER — Other Ambulatory Visit: Payer: Self-pay | Admitting: Primary Care

## 2017-10-06 DIAGNOSIS — E119 Type 2 diabetes mellitus without complications: Secondary | ICD-10-CM

## 2017-10-23 ENCOUNTER — Encounter: Payer: Self-pay | Admitting: Primary Care

## 2017-10-23 ENCOUNTER — Ambulatory Visit: Payer: BLUE CROSS/BLUE SHIELD | Admitting: Primary Care

## 2017-10-23 ENCOUNTER — Other Ambulatory Visit: Payer: Self-pay | Admitting: Primary Care

## 2017-10-23 VITALS — BP 124/78 | HR 80 | Temp 97.8°F | Wt 137.0 lb

## 2017-10-23 DIAGNOSIS — E785 Hyperlipidemia, unspecified: Secondary | ICD-10-CM | POA: Diagnosis not present

## 2017-10-23 DIAGNOSIS — E119 Type 2 diabetes mellitus without complications: Secondary | ICD-10-CM

## 2017-10-23 LAB — LIPID PANEL
Cholesterol: 177 mg/dL (ref 0–200)
HDL: 36.6 mg/dL — AB (ref >39.00)
LDL CALC: 117 mg/dL — AB (ref 0–99)
NonHDL: 140.51
Total CHOL/HDL Ratio: 5
Triglycerides: 118 mg/dL (ref 0.0–149.0)
VLDL: 23.6 mg/dL (ref 0.0–40.0)

## 2017-10-23 LAB — HEMOGLOBIN A1C: Hgb A1c MFr Bld: 7.6 % — ABNORMAL HIGH (ref 4.6–6.5)

## 2017-10-23 MED ORDER — ROSUVASTATIN CALCIUM 5 MG PO TABS: 5.0000 mg | ORAL_TABLET | Freq: Every evening | ORAL | 0 refills | Status: DC

## 2017-10-23 NOTE — Assessment & Plan Note (Signed)
Compliant to meds, checking glucose once daily. Urine micro UTD. Foot exam today unremarkable. Will check lipids, will likely re-start statin, consider Crestor as Simvastatin caused myalgias. Pneumonia vaccination UTD. Follow up in 6 months.

## 2017-10-23 NOTE — Patient Instructions (Addendum)
Stop by the lab prior to leaving today. I will notify you of your results once received.   Continue to work on a health diet.  Ensure you are consuming 64 ounces of water daily.  Please schedule a physical with me in 6 months. You may also schedule a lab only appointment 3-4 days prior. We will discuss your lab results in detail during your physical.  It was a pleasure to see you today!

## 2017-10-23 NOTE — Progress Notes (Signed)
Subjective:    Patient ID: Miranda Greene, female    DOB: 1956-05-30, 62 y.o.   MRN: [id number]  HPI  Miranda Greene is a 62 year old female who presents today for follow up.  1) Type 2 Diabetes: She denies numbness/tingling, chest pain, dizziness.   Current medications include: Kombiglyze XR 01-999 mg, Invokana 300 mg, Lantus 10 units HS. Glipizide was stopped given periods of hypoglycemia.  She is checking her blood glucose once daily and is getting readings of: Before lunch: 117's-120's Highest number 217 after a meal Lowest number 77  Last A1C: 8.4 in August 2018 Last Eye Exam: Scheduled for in May 2019 Last Foot Exam:  Pneumonia Vaccination: Completed in 2017 ACE/ARB: Urine micro negative in August 2018 Statin: Simvastatin   Diet currently consists of:  Breakfast: No appetite, toast, french toast sticks Lunch: Vegetables, chicken livers, salad Dinner: Chicken, steak, pork, vegetables Snacks: Crackers, cheese, fruit Desserts: None Beverages: Flavored water, coffee, un-sweet tea  Exercise: She does not exercise.    2) Hyperlipidemia: Currently managed on simvastatin 20 mg, has not taken in months due to myalgias. Lipid panel in August 2018 with TC of 185, LDL of 118, Trigs of 153. She does not regularly exercise, tries to eat a healthy diet.    Review of Systems  Constitutional: Negative for fatigue.  Respiratory: Negative for shortness of breath.   Cardiovascular: Negative for chest pain.  Neurological: Negative for dizziness, numbness and headaches.       Past Medical History:  Diagnosis Date  . Arthritis    Hands, feet  . Diabetes mellitus   . GERD (gastroesophageal reflux disease)   . Hyperlipidemia   . Lupus      Social History   Socioeconomic History  . Marital status: Married    Spouse name: Not on file  . Number of children: Not on file  . Years of education: Not on file  . Highest education level: Not on file  Social Needs  . Financial  resource strain: Not on file  . Food insecurity - worry: Not on file  . Food insecurity - inability: Not on file  . Transportation needs - medical: Not on file  . Transportation needs - non-medical: Not on file  Occupational History  . Not on file  Tobacco Use  . Smoking status: Never Smoker  . Smokeless tobacco: Never Used  Substance and Sexual Activity  . Alcohol use: No  . Drug use: No  . Sexual activity: Not on file  Other Topics Concern  . Not on file  Social History Narrative   Married.   4 children.    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    Past Surgical History:  Procedure Laterality Date  . ABDOMINAL HYSTERECTOMY    . BLADDER SURGERY    . BREAST LUMPECTOMY     Benign    Family History  Problem Relation Age of Onset  . Alcohol abuse Father   . Lung cancer Father   . Heart disease Father   . Stroke Maternal Grandmother   . Arthritis Paternal Grandmother   . Heart disease Paternal Grandmother   . Diabetes Paternal Grandmother   . Arthritis Mother   . Stroke Mother   . Kidney disease Mother   . Diabetes Mother   . Dementia Mother     Allergies  Allergen Reactions  . Meloxicam Hives  Current Outpatient Medications on File Prior to Visit  Medication Sig Dispense Refill  . ibuprofen (ADVIL,MOTRIN) 200 MG tablet Take 200 mg by mouth every 6 (six) hours as needed.    . Insulin Glargine (LANTUS) 100 UNIT/ML Solostar Pen Inject 10 Units into the skin daily at 10 pm. 15 mL 11  . INVOKANA 300 MG TABS tablet Take 1 tablet (300 mg total) by mouth daily before breakfast. 30 tablet 5  . KOMBIGLYZE XR 01-999 MG TB24 TAKE 1 TABLET BY MOUTH EVERY DAY 30 tablet 5  . omeprazole (PRILOSEC) 20 MG capsule Take 20 mg by mouth daily.    . simvastatin (ZOCOR) 20 MG tablet Take 1 tablet (20 mg total) by mouth at bedtime. (Patient not taking: Reported on 02/05/2017) 90 tablet 3   No current  facility-administered medications on file prior to visit.     BP 124/78 (BP Location: Left Arm, Patient Position: Sitting, Cuff Size: Normal)   Pulse 80   Temp 97.8 F (36.6 C) (Oral)   Wt 137 lb (62.1 kg)   SpO2 98%   BMI 22.80 kg/m    Objective:   Physical Exam  Constitutional: She appears well-nourished.  Neck: Neck supple.  Cardiovascular: Normal rate and regular rhythm.  Pulmonary/Chest: Effort normal and breath sounds normal.  Skin: Skin is warm and dry.          Assessment & Plan:

## 2017-10-23 NOTE — Assessment & Plan Note (Signed)
Check lipids today. Continue to work on diet. May need to restart statin. Simvastatin caused myalgias, consider Crestor.

## 2018-04-22 ENCOUNTER — Other Ambulatory Visit: Payer: Self-pay | Admitting: Primary Care

## 2018-04-22 DIAGNOSIS — E785 Hyperlipidemia, unspecified: Secondary | ICD-10-CM

## 2018-04-22 DIAGNOSIS — E119 Type 2 diabetes mellitus without complications: Secondary | ICD-10-CM

## 2018-04-22 DIAGNOSIS — Z1159 Encounter for screening for other viral diseases: Secondary | ICD-10-CM

## 2018-04-28 ENCOUNTER — Other Ambulatory Visit: Payer: BLUE CROSS/BLUE SHIELD

## 2018-05-01 ENCOUNTER — Encounter: Payer: BLUE CROSS/BLUE SHIELD | Admitting: Primary Care

## 2018-05-07 ENCOUNTER — Other Ambulatory Visit: Payer: BLUE CROSS/BLUE SHIELD

## 2018-05-08 ENCOUNTER — Other Ambulatory Visit: Payer: Self-pay | Admitting: Primary Care

## 2018-05-08 DIAGNOSIS — E119 Type 2 diabetes mellitus without complications: Secondary | ICD-10-CM

## 2018-05-14 ENCOUNTER — Encounter: Payer: BLUE CROSS/BLUE SHIELD | Admitting: Primary Care

## 2018-06-22 ENCOUNTER — Encounter: Payer: Self-pay | Admitting: Primary Care

## 2018-06-22 ENCOUNTER — Ambulatory Visit (INDEPENDENT_AMBULATORY_CARE_PROVIDER_SITE_OTHER): Payer: PRIVATE HEALTH INSURANCE | Admitting: Primary Care

## 2018-06-22 VITALS — BP 120/84 | HR 80 | Temp 98.2°F | Ht 65.0 in | Wt 140.2 lb

## 2018-06-22 DIAGNOSIS — E119 Type 2 diabetes mellitus without complications: Secondary | ICD-10-CM | POA: Diagnosis not present

## 2018-06-22 DIAGNOSIS — E785 Hyperlipidemia, unspecified: Secondary | ICD-10-CM

## 2018-06-22 LAB — MICROALBUMIN / CREATININE URINE RATIO
CREATININE, U: 226.1 mg/dL
Microalb Creat Ratio: 1.2 mg/g (ref 0.0–30.0)
Microalb, Ur: 2.7 mg/dL — ABNORMAL HIGH (ref 0.0–1.9)

## 2018-06-22 LAB — LIPID PANEL
CHOL/HDL RATIO: 5
CHOLESTEROL: 173 mg/dL (ref 0–200)
HDL: 34.1 mg/dL — AB (ref >39.00)
LDL Cholesterol: 117 mg/dL — ABNORMAL HIGH (ref 0–99)
NonHDL: 139.04
TRIGLYCERIDES: 109 mg/dL (ref 0.0–149.0)
VLDL: 21.8 mg/dL (ref 0.0–40.0)

## 2018-06-22 LAB — COMPREHENSIVE METABOLIC PANEL
ALT: 15 U/L (ref 0–35)
AST: 13 U/L (ref 0–37)
Albumin: 4.3 g/dL (ref 3.5–5.2)
Alkaline Phosphatase: 82 U/L (ref 39–117)
BILIRUBIN TOTAL: 0.7 mg/dL (ref 0.2–1.2)
BUN: 15 mg/dL (ref 6–23)
CHLORIDE: 101 meq/L (ref 96–112)
CO2: 31 meq/L (ref 19–32)
Calcium: 9.5 mg/dL (ref 8.4–10.5)
Creatinine, Ser: 0.83 mg/dL (ref 0.40–1.20)
GFR: 74.4 mL/min (ref >60.00)
GLUCOSE: 229 mg/dL — AB (ref 70–99)
Potassium: 4.1 mEq/L (ref 3.5–5.1)
SODIUM: 137 meq/L (ref 135–145)
Total Protein: 7.3 g/dL (ref 6.0–8.3)

## 2018-06-22 LAB — HEMOGLOBIN A1C: Hgb A1c MFr Bld: 9.7 % — ABNORMAL HIGH (ref 4.6–6.5)

## 2018-06-22 NOTE — Assessment & Plan Note (Signed)
Repeat lipid panel pending.  Discussed to work on diet. Cannot tolerate Crestor or Simvastatin, will consider pravastatin.

## 2018-06-22 NOTE — Patient Instructions (Signed)
Stop by the lab prior to leaving today. I will notify you of your results once received.   Continue exercising. You should be getting 150 minutes of moderate intensity exercise weekly.  Continue to work on [employer].  Start checking your blood sugar at least once daily. Rotate times with glucose checks: Before breakfast, 2 hours after lunch or dinner, bedtime.   Please schedule a follow up appointment in 6 months for diabetes check.  It was a pleasure to see you today!   Diabetes Mellitus and Nutrition When you have diabetes (diabetes mellitus), it is very important to have healthy eating habits because your blood sugar (glucose) levels are greatly affected by what you eat and drink. Eating healthy foods in the appropriate amounts, at about the same times every day, can help you:  Control your blood glucose.  Lower your risk of heart disease.  Improve your blood pressure.  Reach or maintain a healthy weight.  Every person with diabetes is different, and each person has different needs for a meal plan. Your health care provider may recommend that you work with a diet and nutrition specialist (dietitian) to make a meal plan that is best for you. Your meal plan may vary depending on factors such as:  The calories you need.  The medicines you take.  Your weight.  Your blood glucose, blood pressure, and cholesterol levels.  Your activity level.  Other health conditions you have, such as heart or kidney disease.  How do carbohydrates affect me? Carbohydrates affect your blood glucose level more than any other type of food. Eating carbohydrates naturally increases the amount of glucose in your blood. Carbohydrate counting is a method for keeping track of how many carbohydrates you eat. Counting carbohydrates is important to keep your blood glucose at a healthy level, especially if you use insulin or take certain oral diabetes medicines. It is important to know how many carbohydrates  you can safely have in each meal. This is different for every person. Your dietitian can help you calculate how many carbohydrates you should have at each meal and for snack. Foods that contain carbohydrates include:  Bread, cereal, rice, pasta, and crackers.  Potatoes and corn.  Peas, beans, and lentils.  Milk and yogurt.  Fruit and juice.  Desserts, such as cakes, cookies, ice cream, and candy.  How does alcohol affect me? Alcohol can cause a sudden decrease in blood glucose (hypoglycemia), especially if you use insulin or take certain oral diabetes medicines. Hypoglycemia can be a life-threatening condition. Symptoms of hypoglycemia (sleepiness, dizziness, and confusion) are similar to symptoms of having too much alcohol. If your health care provider says that alcohol is safe for you, follow these guidelines:  Limit alcohol intake to no more than 1 drink per day for nonpregnant women and 2 drinks per day for men. One drink equals 12 oz of beer, 5 oz of wine, or 1 oz of hard liquor.  Do not drink on an empty stomach.  Keep yourself hydrated with water, diet soda, or unsweetened iced tea.  Keep in mind that regular soda, juice, and other mixers may contain a lot of sugar and must be counted as carbohydrates.  What are tips for following this plan? Reading food labels  Start by checking the serving size on the label. The amount of calories, carbohydrates, fats, and other nutrients listed on the label are based on one serving of the food. Many foods contain more than one serving per package.  Check  the total grams (g) of carbohydrates in one serving. You can calculate the number of servings of carbohydrates in one serving by dividing the total carbohydrates by 15. For example, if a food has 30 g of total carbohydrates, it would be equal to 2 servings of carbohydrates.  Check the number of grams (g) of saturated and trans fats in one serving. Choose foods that have low or no amount  of these fats.  Check the number of milligrams (mg) of sodium in one serving. Most people should limit total sodium intake to less than 2,300 mg per day.  Always check the nutrition information of foods labeled as "low-fat" or "nonfat". These foods may be higher in added sugar or refined carbohydrates and should be avoided.  Talk to your dietitian to identify your daily goals for nutrients listed on the label. Shopping  Avoid buying canned, premade, or processed foods. These foods tend to be high in fat, sodium, and added sugar.  Shop around the outside edge of the grocery store. This includes fresh fruits and vegetables, bulk grains, fresh meats, and fresh dairy. Cooking  Use low-heat cooking methods, such as baking, instead of high-heat cooking methods like deep frying.  Cook using healthy oils, such as olive, canola, or sunflower oil.  Avoid cooking with butter, cream, or high-fat meats. Meal planning  Eat meals and snacks regularly, preferably at the same times every day. Avoid going long periods of time without eating.  Eat foods high in fiber, such as fresh fruits, vegetables, beans, and whole grains. Talk to your dietitian about how many servings of carbohydrates you can eat at each meal.  Eat 4-6 ounces of lean protein each day, such as lean meat, chicken, fish, eggs, or tofu. 1 ounce is equal to 1 ounce of meat, chicken, or fish, 1 egg, or 1/4 cup of tofu.  Eat some foods each day that contain healthy fats, such as avocado, nuts, seeds, and fish. Lifestyle   Check your blood glucose regularly.  Exercise at least 30 minutes 5 or more days each week, or as told by your health care provider.  Take medicines as told by your health care provider.  Do not use any products that contain nicotine or tobacco, such as cigarettes and e-cigarettes. If you need help quitting, ask your health care provider.  Work with a Social worker or diabetes educator to identify strategies to manage  stress and any emotional and social challenges. What are some questions to ask my health care provider?  Do I need to meet with a diabetes educator?  Do I need to meet with a dietitian?  What number can I call if I have questions?  When are the best times to check my blood glucose? Where to find more information:  American Diabetes Association: diabetes.org/food-and-fitness/food  Academy of Nutrition and Dietetics: PokerClues.dk  [employer] of Diabetes and Digestive and Kidney Diseases (NIH): ContactWire.be Summary  A healthy meal plan will help you control your blood glucose and maintain a healthy lifestyle.  Working with a diet and nutrition specialist (dietitian) can help you make a meal plan that is best for you.  Keep in mind that carbohydrates and alcohol have immediate effects on your blood glucose levels. It is important to count carbohydrates and to use alcohol carefully. This information is not intended to replace advice given to you by your health care provider. Make sure you discuss any questions you have with your health care provider. Document Released: 05/23/2005 Document Revised: 09/30/2016  Document Reviewed: 09/30/2016 Elsevier Interactive Patient Education  [employer].

## 2018-06-22 NOTE — Assessment & Plan Note (Addendum)
Repeat A1C and urine microalbumin pending. Discussed to start checking glucose once daily, rotating times. Discussed to work on diet. Continue Lantus and Kombiglyze for now.  She will schedule an eye exam when she has insurance, recently started a new job. Cannot tolerate Crestor or Simvastatin, consider Pravastatin. Repeat lipids pending.  Follow up in 6 months or sooner based off of A1C.

## 2018-06-22 NOTE — Progress Notes (Signed)
Subjective:    Patient ID: Miranda Greene, female    DOB: [birth year], 62 y.o.   MRN: [id number]  HPI  Miranda Greene is a 62 year old female who presents today for follow up.  1) Type 2 Diabetes:   Current medications include: Invokana 300 mg, Kombiglyze XR 01-999 mg BID, Lantus 10 units HS. She is no longer taking Invokana as it caused a rash  She is checking her blood glucose 2-3 times weekly and is getting readings of: Before Dinner: 130's  Last A1C: 7.6 in February 2019 Last Eye Exam: Plans on scheduling  Last Foot Exam: Due in February 2020 Pneumonia Vaccination: Completed in 2017 ACE/ARB: Urine Microalbumin pending Statin: None. Crestor made her feel sick, Simvastatin caused myalgias  Diet currently consists of:  Breakfast: Cereal, Eggs and tomato Lunch: Salad, burger and french fries Dinner: Vegetables, steak, potato, pork chops, chicken  Snacks: Fruit, crackers Desserts: Cookies. Once weekly. Beverages: Coffee, water, unsweet tea  Exercise: She is not exercising.    The 10-year ASCVD risk score Miranda Greene DC Miranda Greene., et al., 2013) is: 6.6%   Values used to calculate the score:     Age: 3 years     Sex: Female     Is Non-Hispanic African American: No     Diabetic: Yes     Tobacco smoker: No     Systolic Blood Pressure: 259 mmHg     Is BP treated: No     HDL Cholesterol: 36.6 mg/dL     Total Cholesterol: 177 mg/dL     Review of Systems  Respiratory: Negative for shortness of breath.   Cardiovascular: Negative for chest pain.  Neurological: Negative for dizziness and headaches.       Past Medical History:  Diagnosis Date  . Arthritis    Hands, feet  . Diabetes mellitus   . GERD (gastroesophageal reflux disease)   . Hyperlipidemia   . Lupus ([place])      Social History   Socioeconomic History  . Marital status: Married    Spouse name: Not on file  . Number of children: Not on file  . Years of education: Not on file  . Highest education level: Not on file   Occupational History  . Not on file  Social Needs  . Financial resource strain: Not on file  . Food insecurity:    Worry: Not on file    Inability: Not on file  . Transportation needs:    Medical: Not on file    Non-medical: Not on file  Tobacco Use  . Smoking status: Never Smoker  . Smokeless tobacco: Never Used  Substance and Sexual Activity  . Alcohol use: No  . Drug use: No  . Sexual activity: Not on file  Lifestyle  . Physical activity:    Days per week: Not on file    Minutes per session: Not on file  . Stress: Not on file  Relationships  . Social connections:    Talks on phone: Not on file    Gets together: Not on file    Attends religious service: Not on file    Active member of club or organization: Not on file    Attends meetings of clubs or organizations: Not on file    Relationship status: Not on file  . Intimate partner violence:    Fear of current or ex partner: Not on file    Emotionally abused: Not on file    Physically abused: Not on  file    Forced sexual activity: Not on file  Other Topics Concern  . Not on file  Social History Narrative   Married.   4 children.    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    Past Surgical History:  Procedure Laterality Date  . ABDOMINAL HYSTERECTOMY    . BLADDER SURGERY    . BREAST LUMPECTOMY     Benign    Family History  Problem Relation Age of Onset  . Alcohol abuse Father   . Lung cancer Father   . Heart disease Father   . Stroke Maternal Grandmother   . Arthritis Paternal Grandmother   . Heart disease Paternal Grandmother   . Diabetes Paternal Grandmother   . Arthritis Mother   . Stroke Mother   . Kidney disease Mother   . Diabetes Mother   . Dementia Mother     Allergies  Allergen Reactions  . Invokana [Canagliflozin] Rash  . Meloxicam Hives    Current Outpatient Medications on File Prior to Visit    Medication Sig Dispense Refill  . ibuprofen (ADVIL,MOTRIN) 200 MG tablet Take 200 mg by mouth every 6 (six) hours as needed.    . Insulin Glargine (LANTUS) 100 UNIT/ML Solostar Pen Inject 10 Units into the skin daily at 10 pm. 15 mL 11  . KOMBIGLYZE XR 01-999 MG TB24 TAKE 1 TABLET BY MOUTH EVERY DAY 30 tablet 0  . omeprazole (PRILOSEC) 20 MG capsule Take 20 mg by mouth daily.     No current facility-administered medications on file prior to visit.     BP 120/84   Pulse 80   Temp 98.2 F (36.8 C) (Oral)   Ht 5\' 5"  (1.651 m)   Wt 140 lb 4 oz (63.6 kg)   SpO2 98%   BMI 23.34 kg/m    Objective:   Physical Exam  Constitutional: She appears well-nourished.  Neck: Neck supple.  Cardiovascular: Normal rate and regular rhythm.  Respiratory: Effort normal and breath sounds normal.  Skin: Skin is warm and dry.           Assessment & Plan:

## 2018-06-24 DIAGNOSIS — E119 Type 2 diabetes mellitus without complications: Secondary | ICD-10-CM

## 2018-06-26 MED ORDER — INSULIN GLARGINE 100 UNIT/ML SOLOSTAR PEN: 15.0000 [IU] | PEN_INJECTOR | Freq: Every day | SUBCUTANEOUS | 5 refills | Status: DC

## 2018-06-26 MED ORDER — SAXAGLIPTIN-METFORMIN ER 5-1000 MG PO TB24: 1.0000 | ORAL_TABLET | Freq: Every day | ORAL | 5 refills | Status: DC

## 2018-07-02 MED ORDER — METFORMIN HCL 1000 MG PO TABS: 1000.0000 mg | ORAL_TABLET | Freq: Two times a day (BID) | ORAL | 3 refills | Status: DC

## 2018-07-02 MED ORDER — BASAGLAR KWIKPEN 100 UNIT/ML ~~LOC~~ SOPN: 15.0000 [IU] | PEN_INJECTOR | Freq: Every day | SUBCUTANEOUS | 3 refills | Status: DC

## 2018-07-03 MED ORDER — SAXAGLIPTIN HCL 5 MG PO TABS: 5.0000 mg | ORAL_TABLET | Freq: Every day | ORAL | 1 refills | Status: DC

## 2018-08-05 ENCOUNTER — Ambulatory Visit: Payer: PRIVATE HEALTH INSURANCE | Admitting: Primary Care

## 2018-08-13 ENCOUNTER — Encounter: Payer: Self-pay | Admitting: Primary Care

## 2018-08-13 ENCOUNTER — Ambulatory Visit (INDEPENDENT_AMBULATORY_CARE_PROVIDER_SITE_OTHER): Payer: BLUE CROSS/BLUE SHIELD | Admitting: Primary Care

## 2018-08-13 VITALS — BP 120/78 | HR 74 | Temp 98.2°F | Ht 65.0 in | Wt 140.5 lb

## 2018-08-13 DIAGNOSIS — E119 Type 2 diabetes mellitus without complications: Secondary | ICD-10-CM | POA: Diagnosis not present

## 2018-08-13 DIAGNOSIS — E785 Hyperlipidemia, unspecified: Secondary | ICD-10-CM | POA: Diagnosis not present

## 2018-08-13 MED ORDER — ROSUVASTATIN CALCIUM 5 MG PO TABS: 5.0000 mg | ORAL_TABLET | Freq: Every day | ORAL | 3 refills | Status: DC

## 2018-08-13 NOTE — Assessment & Plan Note (Signed)
Taking Crestor every other day when she can remember. Discussed the importance of daily compliance. Continue Crestor 5 mg. Repeat lipids pending.

## 2018-08-13 NOTE — Progress Notes (Signed)
Subjective:    Patient ID: Miranda Greene, female    DOB: Jul 28, 1956, 62 y.o.   MRN: [id number]  HPI  Miranda Greene is a 62 year old female who presents today for follow up of diabetes.  Current medications include: Basaglar 15 units nightly, Metformin 1000 mg BID, saxaglipitin 5 mg.   She is checking her blood glucose one time daily and is getting readings of: Fasting AM: 110's Before Lunch: 110's  Highest reading: 160 Lowest reading: 94  Last A1C: 9.7 Last Eye Exam: No recent exam Last Foot Exam: Due in February 2020 Pneumonia Vaccination: Completed in 2017 ACE/ARB: Urine microalbumin negative in October 2019 Statin: Crestor. Has been taking every other day since her last visit.   Diet currently consists of:  Breakfast: Oatmeal, grits, banana sandwich Lunch: Salad, hamburger and fries, sandwich Dinner: Steak, chicken, vegetables, starch Snacks: None Desserts: Rarely  Beverages: Water, coffee, flavored water mostly  Exercise: she is not exercising    Review of Systems  Eyes: Negative for visual disturbance.  Respiratory: Negative for shortness of breath.   Cardiovascular: Negative for chest pain.  Neurological: Negative for dizziness and headaches.       Past Medical History:  Diagnosis Date  . Arthritis    Hands, feet  . Diabetes mellitus   . GERD (gastroesophageal reflux disease)   . Hyperlipidemia   . Lupus ([place])      Social History   Socioeconomic History  . Marital status: Married    Spouse name: Not on file  . Number of children: Not on file  . Years of education: Not on file  . Highest education level: Not on file  Occupational History  . Not on file  Social Needs  . Financial resource strain: Not on file  . Food insecurity:    Worry: Not on file    Inability: Not on file  . Transportation needs:    Medical: Not on file    Non-medical: Not on file  Tobacco Use  . Smoking status: Never Smoker  . Smokeless tobacco: Never Used  Substance  and Sexual Activity  . Alcohol use: No  . Drug use: No  . Sexual activity: Not on file  Lifestyle  . Physical activity:    Days per week: Not on file    Minutes per session: Not on file  . Stress: Not on file  Relationships  . Social connections:    Talks on phone: Not on file    Gets together: Not on file    Attends religious service: Not on file    Active member of club or organization: Not on file    Attends meetings of clubs or organizations: Not on file    Relationship status: Not on file  . Intimate partner violence:    Fear of current or ex partner: Not on file    Emotionally abused: Not on file    Physically abused: Not on file    Forced sexual activity: Not on file  Other Topics Concern  . Not on file  Social History Narrative   Married.   4 children.    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    Past Surgical History:  Procedure Laterality Date  . ABDOMINAL HYSTERECTOMY    . BLADDER SURGERY    . BREAST LUMPECTOMY     Benign    Family History  Problem Relation Age  of Onset  . Alcohol abuse Father   . Lung cancer Father   . Heart disease Father   . Stroke Maternal Grandmother   . Arthritis Paternal Grandmother   . Heart disease Paternal Grandmother   . Diabetes Paternal Grandmother   . Arthritis Mother   . Stroke Mother   . Kidney disease Mother   . Diabetes Mother   . Dementia Mother     Allergies  Allergen Reactions  . Invokana [Canagliflozin] Rash  . Meloxicam Hives    Current Outpatient Medications on File Prior to Visit  Medication Sig Dispense Refill  . ibuprofen (ADVIL,MOTRIN) 200 MG tablet Take 200 mg by mouth every 6 (six) hours as needed.    . Insulin Glargine (BASAGLAR KWIKPEN) 100 UNIT/ML SOPN Inject 0.15 mLs (15 Units total) into the skin at bedtime. 15 mL 3  . metFORMIN (GLUCOPHAGE) 1000 MG tablet Take 1 tablet (1,000 mg total) by mouth 2 (two) times  daily with a meal. For diabetes. 180 tablet 3  . omeprazole (PRILOSEC) 20 MG capsule Take 20 mg by mouth daily.    . saxagliptin HCl (ONGLYZA) 5 MG TABS tablet Take 1 tablet (5 mg total) by mouth daily. For diabetes. 90 tablet 1   No current facility-administered medications on file prior to visit.     BP 120/78   Pulse 74   Temp 98.2 F (36.8 C) (Oral)   Ht 5\' 5"  (1.651 m)   Wt 140 lb 8 oz (63.7 kg)   SpO2 97%   BMI 23.38 kg/m    Objective:   Physical Exam  Constitutional: She appears well-nourished.  Neck: Neck supple.  Cardiovascular: Normal rate and regular rhythm.  Respiratory: Effort normal and breath sounds normal.  Skin: Skin is warm and dry.           Assessment & Plan:

## 2018-08-13 NOTE — Assessment & Plan Note (Signed)
Glucose readings improved. Continue basaglar 15 units, metformin, and saxagliptin.   She is taking Crestor every other day when she can remember, discussed to start daily. She will schedule an eye exam now that she has insurance.  Foot exam UTD. Pneumonia vaccination UTD.  Repeat A1C in 2 weeks, follow up in 6 months.

## 2018-08-13 NOTE — Patient Instructions (Addendum)
Make sure to take the rosuvastatin medication everyday for cholesterol and prevention of heart disease/stroke.  Continue basaglar insulin at 15 units nightly. Continue Metformin and Saxagliptin.  Schedule a lab only appointment for 2 weeks, make sure you come fasting. No food 4 hours prior. You may have plain water or black coffee.  Please schedule a physical with me in 6 months. You may also schedule a lab only appointment 3-4 days prior. We will discuss your lab results in detail during your physical.  It was a pleasure to see you today!

## 2018-08-17 ENCOUNTER — Encounter: Payer: Self-pay | Admitting: *Deleted

## 2018-08-27 ENCOUNTER — Other Ambulatory Visit (INDEPENDENT_AMBULATORY_CARE_PROVIDER_SITE_OTHER): Payer: BLUE CROSS/BLUE SHIELD

## 2018-08-27 ENCOUNTER — Other Ambulatory Visit: Payer: Self-pay | Admitting: Primary Care

## 2018-08-27 DIAGNOSIS — E119 Type 2 diabetes mellitus without complications: Secondary | ICD-10-CM

## 2018-08-27 DIAGNOSIS — Z1159 Encounter for screening for other viral diseases: Secondary | ICD-10-CM

## 2018-08-27 DIAGNOSIS — E785 Hyperlipidemia, unspecified: Secondary | ICD-10-CM

## 2018-08-27 LAB — HEPATIC FUNCTION PANEL
ALK PHOS: 69 U/L (ref 39–117)
ALT: 12 U/L (ref 0–35)
AST: 15 U/L (ref 0–37)
Albumin: 4.4 g/dL (ref 3.5–5.2)
BILIRUBIN DIRECT: 0.1 mg/dL (ref 0.0–0.3)
BILIRUBIN TOTAL: 0.6 mg/dL (ref 0.2–1.2)
TOTAL PROTEIN: 7.5 g/dL (ref 6.0–8.3)

## 2018-08-27 LAB — LIPID PANEL
CHOL/HDL RATIO: 4
Cholesterol: 178 mg/dL (ref 0–200)
HDL: 40 mg/dL (ref >39.00)
LDL CALC: 114 mg/dL — AB (ref 0–99)
NonHDL: 137.87
TRIGLYCERIDES: 120 mg/dL (ref 0.0–149.0)
VLDL: 24 mg/dL (ref 0.0–40.0)

## 2018-08-27 LAB — HEMOGLOBIN A1C: HEMOGLOBIN A1C: 8.6 % — AB (ref 4.6–6.5)

## 2018-08-27 MED ORDER — BASAGLAR KWIKPEN 100 UNIT/ML ~~LOC~~ SOPN: 20.0000 [IU] | PEN_INJECTOR | Freq: Every day | SUBCUTANEOUS | 3 refills | Status: DC

## 2018-08-28 DIAGNOSIS — E119 Type 2 diabetes mellitus without complications: Secondary | ICD-10-CM

## 2018-08-28 LAB — HEPATITIS C ANTIBODY
Hepatitis C Ab: NONREACTIVE
SIGNAL TO CUT-OFF: 0.14 (ref <1.00)

## 2018-08-28 NOTE — Telephone Encounter (Signed)
Miranda Greene, can you schedule her for a lab only appointment?

## 2018-09-09 DIAGNOSIS — C50919 Malignant neoplasm of unspecified site of unspecified female breast: Secondary | ICD-10-CM

## 2018-09-09 HISTORY — DX: Malignant neoplasm of unspecified site of unspecified female breast: C50.919

## 2018-11-25 ENCOUNTER — Other Ambulatory Visit: Payer: Self-pay

## 2018-11-25 ENCOUNTER — Other Ambulatory Visit (INDEPENDENT_AMBULATORY_CARE_PROVIDER_SITE_OTHER): Payer: BLUE CROSS/BLUE SHIELD

## 2018-11-25 DIAGNOSIS — E119 Type 2 diabetes mellitus without complications: Secondary | ICD-10-CM | POA: Diagnosis not present

## 2018-11-25 LAB — POCT GLYCOSYLATED HEMOGLOBIN (HGB A1C): Hemoglobin A1C: 7.9 % — AB (ref 4.0–5.6)

## 2019-02-10 ENCOUNTER — Other Ambulatory Visit: Payer: Self-pay | Admitting: Primary Care

## 2019-02-10 DIAGNOSIS — E119 Type 2 diabetes mellitus without complications: Secondary | ICD-10-CM

## 2019-02-10 DIAGNOSIS — E785 Hyperlipidemia, unspecified: Secondary | ICD-10-CM

## 2019-02-15 ENCOUNTER — Other Ambulatory Visit: Payer: Self-pay

## 2019-02-15 ENCOUNTER — Other Ambulatory Visit (INDEPENDENT_AMBULATORY_CARE_PROVIDER_SITE_OTHER): Payer: BC Managed Care – PPO

## 2019-02-15 DIAGNOSIS — E785 Hyperlipidemia, unspecified: Secondary | ICD-10-CM | POA: Diagnosis not present

## 2019-02-15 DIAGNOSIS — E119 Type 2 diabetes mellitus without complications: Secondary | ICD-10-CM

## 2019-02-15 LAB — COMPREHENSIVE METABOLIC PANEL
ALT: 13 U/L (ref 0–35)
AST: 16 U/L (ref 0–37)
Albumin: 4.3 g/dL (ref 3.5–5.2)
Alkaline Phosphatase: 70 U/L (ref 39–117)
BUN: 16 mg/dL (ref 6–23)
CO2: 30 mEq/L (ref 19–32)
Calcium: 9.5 mg/dL (ref 8.4–10.5)
Chloride: 102 mEq/L (ref 96–112)
Creatinine, Ser: 0.79 mg/dL (ref 0.40–1.20)
GFR: 73.94 mL/min (ref >60.00)
Glucose, Bld: 172 mg/dL — ABNORMAL HIGH (ref 70–99)
Potassium: 4.4 mEq/L (ref 3.5–5.1)
Sodium: 137 mEq/L (ref 135–145)
Total Bilirubin: 0.6 mg/dL (ref 0.2–1.2)
Total Protein: 7.2 g/dL (ref 6.0–8.3)

## 2019-02-15 LAB — MICROALBUMIN / CREATININE URINE RATIO
Creatinine,U: 184.3 mg/dL
Microalb Creat Ratio: 1.1 mg/g (ref 0.0–30.0)
Microalb, Ur: 2.1 mg/dL — ABNORMAL HIGH (ref 0.0–1.9)

## 2019-02-15 LAB — LIPID PANEL
Cholesterol: 173 mg/dL (ref 0–200)
HDL: 37.2 mg/dL — ABNORMAL LOW (ref >39.00)
LDL Cholesterol: 113 mg/dL — ABNORMAL HIGH (ref 0–99)
NonHDL: 136.27
Total CHOL/HDL Ratio: 5
Triglycerides: 118 mg/dL (ref 0.0–149.0)
VLDL: 23.6 mg/dL (ref 0.0–40.0)

## 2019-02-15 LAB — HEMOGLOBIN A1C: Hgb A1c MFr Bld: 8.4 % — ABNORMAL HIGH (ref 4.6–6.5)

## 2019-02-18 ENCOUNTER — Encounter: Payer: Self-pay | Admitting: Primary Care

## 2019-02-18 ENCOUNTER — Ambulatory Visit (INDEPENDENT_AMBULATORY_CARE_PROVIDER_SITE_OTHER): Payer: BC Managed Care – PPO | Admitting: Primary Care

## 2019-02-18 ENCOUNTER — Other Ambulatory Visit: Payer: Self-pay

## 2019-02-18 VITALS — BP 126/84 | HR 74 | Temp 97.8°F | Ht 65.0 in | Wt 143.2 lb

## 2019-02-18 DIAGNOSIS — Z1239 Encounter for other screening for malignant neoplasm of breast: Secondary | ICD-10-CM

## 2019-02-18 DIAGNOSIS — E785 Hyperlipidemia, unspecified: Secondary | ICD-10-CM

## 2019-02-18 DIAGNOSIS — K130 Diseases of lips: Secondary | ICD-10-CM

## 2019-02-18 DIAGNOSIS — R229 Localized swelling, mass and lump, unspecified: Secondary | ICD-10-CM | POA: Insufficient documentation

## 2019-02-18 DIAGNOSIS — E119 Type 2 diabetes mellitus without complications: Secondary | ICD-10-CM

## 2019-02-18 DIAGNOSIS — K219 Gastro-esophageal reflux disease without esophagitis: Secondary | ICD-10-CM | POA: Diagnosis not present

## 2019-02-18 DIAGNOSIS — Z0001 Encounter for general adult medical examination with abnormal findings: Secondary | ICD-10-CM

## 2019-02-18 MED ORDER — PRAVASTATIN SODIUM 20 MG PO TABS: 20.0000 mg | ORAL_TABLET | Freq: Every day | ORAL | 3 refills | Status: DC

## 2019-02-18 MED ORDER — MUPIROCIN 2 % EX OINT: 1.0000 "application " | TOPICAL_OINTMENT | Freq: Two times a day (BID) | CUTANEOUS | 0 refills | Status: DC

## 2019-02-18 NOTE — Patient Instructions (Signed)
Apply the mupirocin ointment twice daily for one week, then as needed. Call me if this doesn't help with the soreness to your mouth.  Start pravastatin 20 mg tablets for cholesterol. Take 1 tablet by mouth once nightly, or every other night.  Start exercising. You should be getting 150 minutes of moderate intensity exercise weekly.  It is important that you improve your diet. Please limit carbohydrates in the form of white bread, rice, pasta, sweets, fast food, fried food, sugary drinks, etc. Increase your consumption of fresh fruits and vegetables, whole grains, lean protein.  Ensure you are consuming 64 ounces of water daily.  Continue your current dose of Basaglar insulin of 20 units and Metformin.  Schedule a lab only appointment for 3 months.  Please schedule a follow up appointment in 6 months.   It was a pleasure to see you today!   Preventive Care 40-64 Years, Female Preventive care refers to lifestyle choices and visits with your health care provider that can promote health and wellness. What does preventive care include?   A yearly physical exam. This is also called an annual well check.  Dental exams once or twice a year.  Routine eye exams. Ask your health care provider how often you should have your eyes checked.  Personal lifestyle choices, including: ? Daily care of your teeth and gums. ? Regular physical activity. ? Eating a healthy diet. ? Avoiding tobacco and drug use. ? Limiting alcohol use. ? Practicing safe sex. ? Taking low-dose aspirin daily starting at age 6. ? Taking vitamin and mineral supplements as recommended by your health care provider. What happens during an annual well check? The services and screenings done by your health care provider during your annual well check will depend on your age, overall health, lifestyle risk factors, and family history of disease. Counseling Your health care provider may ask you questions about your:  Alcohol  use.  Tobacco use.  Drug use.  Emotional well-being.  Home and relationship well-being.  Sexual activity.  Eating habits.  Work and work Statistician.  Method of birth control.  Menstrual cycle.  Pregnancy history. Screening You may have the following tests or measurements:  Height, weight, and BMI.  Blood pressure.  Lipid and cholesterol levels. These may be checked every 5 years, or more frequently if you are over 79 years old.  Skin check.  Lung cancer screening. You may have this screening every year starting at age 14 if you have a 30-pack-year history of smoking and currently smoke or have quit within the past 15 years.  Colorectal cancer screening. All adults should have this screening starting at age 63 and continuing until age 71. Your health care provider may recommend screening at age 22. You will have tests every 1-10 years, depending on your results and the type of screening test. People at increased risk should start screening at an earlier age. Screening tests may include: ? Guaiac-based fecal occult blood testing. ? Fecal immunochemical test (FIT). ? Stool DNA test. ? Virtual colonoscopy. ? Sigmoidoscopy. During this test, a flexible tube with a tiny camera (sigmoidoscope) is used to examine your rectum and lower colon. The sigmoidoscope is inserted through your anus into your rectum and lower colon. ? Colonoscopy. During this test, a long, thin, flexible tube with a tiny camera (colonoscope) is used to examine your entire colon and rectum.  Hepatitis C blood test.  Hepatitis B blood test.  Sexually transmitted disease (STD) testing.  Diabetes screening. This is  done by checking your blood sugar (glucose) after you have not eaten for a while (fasting). You may have this done every 1-3 years.  Mammogram. This may be done every 1-2 years. Talk to your health care provider about when you should start having regular mammograms. This may depend on whether  you have a family history of breast cancer.  BRCA-related cancer screening. This may be done if you have a family history of breast, ovarian, tubal, or peritoneal cancers.  Pelvic exam and Pap test. This may be done every 3 years starting at age 40. Starting at age 14, this may be done every 5 years if you have a Pap test in combination with an HPV test.  Bone density scan. This is done to screen for osteoporosis. You may have this scan if you are at high risk for osteoporosis. Discuss your test results, treatment options, and if necessary, the need for more tests with your health care provider. Vaccines Your health care provider may recommend certain vaccines, such as:  Influenza vaccine. This is recommended every year.  Tetanus, diphtheria, and acellular pertussis (Tdap, Td) vaccine. You may need a Td booster every 10 years.  Varicella vaccine. You may need this if you have not been vaccinated.  Zoster vaccine. You may need this after age 74.  Measles, mumps, and rubella (MMR) vaccine. You may need at least one dose of MMR if you were born in 1957 or later. You may also need a second dose.  Pneumococcal 13-valent conjugate (PCV13) vaccine. You may need this if you have certain conditions and were not previously vaccinated.  Pneumococcal polysaccharide (PPSV23) vaccine. You may need one or two doses if you smoke cigarettes or if you have certain conditions.  Meningococcal vaccine. You may need this if you have certain conditions.  Hepatitis A vaccine. You may need this if you have certain conditions or if you travel or work in places where you may be exposed to hepatitis A.  Hepatitis B vaccine. You may need this if you have certain conditions or if you travel or work in places where you may be exposed to hepatitis B.  Haemophilus influenzae type b (Hib) vaccine. You may need this if you have certain conditions. Talk to your health care provider about which screenings and vaccines  you need and how often you need them. This information is not intended to replace advice given to you by your health care provider. Make sure you discuss any questions you have with your health care provider. Document Released: 09/22/2015 Document Revised: 10/16/2017 Document Reviewed: 06/27/2015 Elsevier Interactive Patient Education  2019 [employer].

## 2019-02-18 NOTE — Assessment & Plan Note (Signed)
Recent A1C of 8.4. likely due to dietary choices and stress.   Given current glucose readings of 90-120 would rather not adjust her insulin, rather she work on dietary changes and stress reduction. She agrees.  Continue Basaglar 20 units and Metformin. Will add in statin. Urine microalbumin negative. Foot exam today. Pneumonia vaccination UTD.  Follow up in 6 months.

## 2019-02-18 NOTE — Progress Notes (Signed)
Subjective:    Patient ID: Miranda Greene, female    DOB: Mar 11, 1956, 63 y.o.   MRN: [id number]  HPI  Miranda Greene is a 63 year old female who presents today for complete physical.  She is checking blood sugars twice daily:  AM fasting: 90's 2 hours after lunch: 120's  Immunizations: -Tetanus: Completed in 2017 -Influenza: Due this season  -Pneumonia:  Completed in 2017 -Shingles: Declines   Diet: She endorses a poor diet. She is not eating much. Eating toast, banana sandwich, biscuits for breakfast. Lunch is usually fried food or left overs. Dinner is chicken, steak, starch. She does not eat desserts often. Exercise: She is active at work, not exercising.   Eye exam: No recent exam. She will reschedule.  Dental exam: No recent exam. Colonoscopy: Completed Cologuard in 2018, due in 2021 Pap Smear: Hysterectomy  Mammogram: Due Hep C Screen: Negative   Review of Systems  Constitutional: Negative for unexpected weight change.  HENT: Negative for rhinorrhea.        Cracking to corners of mouth, also with sore to right corner of mouth  Respiratory: Negative for cough and shortness of breath.   Cardiovascular: Negative for chest pain.  Gastrointestinal: Negative for constipation and diarrhea.  Genitourinary: Negative for difficulty urinating.  Musculoskeletal: Negative for arthralgias and myalgias.  Skin: Negative for rash.  Allergic/Immunologic: Negative for environmental allergies.  Neurological: Negative for dizziness, numbness and headaches.  Psychiatric/Behavioral:       Under a lot of stress from father in [place] recent passing.       Past Medical History:  Diagnosis Date  . Arthritis    Hands, feet  . Diabetes mellitus   . GERD (gastroesophageal reflux disease)   . Hyperlipidemia   . Lupus ([place])      Social History   Socioeconomic History  . Marital status: Married    Spouse name: Not on file  . Number of children: Not on file  . Years of education: Not  on file  . Highest education level: Not on file  Occupational History  . Not on file  Social Needs  . Financial resource strain: Not on file  . Food insecurity    Worry: Not on file    Inability: Not on file  . Transportation needs    Medical: Not on file    Non-medical: Not on file  Tobacco Use  . Smoking status: Never Smoker  . Smokeless tobacco: Never Used  Substance and Sexual Activity  . Alcohol use: No  . Drug use: No  . Sexual activity: Not on file  Lifestyle  . Physical activity    Days per week: Not on file    Minutes per session: Not on file  . Stress: Not on file  Relationships  . Social Herbalist on phone: Not on file    Gets together: Not on file    Attends religious service: Not on file    Active member of club or organization: Not on file    Attends meetings of clubs or organizations: Not on file    Relationship status: Not on file  . Intimate partner violence    Fear of current or ex partner: Not on file    Emotionally abused: Not on file    Physically abused: Not on file    Forced sexual activity: Not on file  Other Topics Concern  . Not on file  Social History Narrative   Married.  4 children.    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    Past Surgical History:  Procedure Laterality Date  . ABDOMINAL HYSTERECTOMY    . BLADDER SURGERY    . BREAST LUMPECTOMY     Benign    Family History  Problem Relation Age of Onset  . Alcohol abuse Father   . Lung cancer Father   . Heart disease Father   . Stroke Maternal Grandmother   . Arthritis Paternal Grandmother   . Heart disease Paternal Grandmother   . Diabetes Paternal Grandmother   . Arthritis Mother   . Stroke Mother   . Kidney disease Mother   . Diabetes Mother   . Dementia Mother     Allergies  Allergen Reactions  . Invokana [Canagliflozin] Rash  . Meloxicam Hives    Current Outpatient  Medications on File Prior to Visit  Medication Sig Dispense Refill  . ibuprofen (ADVIL,MOTRIN) 200 MG tablet Take 200 mg by mouth every 6 (six) hours as needed.    . Insulin Glargine (BASAGLAR KWIKPEN) 100 UNIT/ML SOPN Inject 0.2 mLs (20 Units total) into the skin at bedtime. 15 mL 3  . metFORMIN (GLUCOPHAGE) 1000 MG tablet Take 1 tablet (1,000 mg total) by mouth 2 (two) times daily with a meal. For diabetes. 180 tablet 3  . omeprazole (PRILOSEC) 20 MG capsule Take 20 mg by mouth daily.     No current facility-administered medications on file prior to visit.     BP 126/84   Pulse 74   Temp 97.8 F (36.6 C) (Tympanic)   Ht 5\' 5"  (1.651 m)   Wt 143 lb 4 oz (65 kg)   SpO2 98%   BMI 23.84 kg/m    Objective:   Physical Exam  Constitutional: She is oriented to person, place, and time. She appears well-nourished.  HENT:  Mouth/Throat: No oropharyngeal exudate.  Small pink raised sore to right corner of mouth. Dryness with cracking to left corner of mouth.   Eyes: Pupils are equal, round, and reactive to light. EOM are normal.  Neck: Neck supple. No thyromegaly present.  Cardiovascular: Normal rate and regular rhythm.  Respiratory: Effort normal and breath sounds normal.  GI: Soft. Bowel sounds are normal. There is no abdominal tenderness.  Musculoskeletal: Normal range of motion.  Neurological: She is alert and oriented to person, place, and time.  Skin: Skin is warm and dry.  Psychiatric: She has a normal mood and affect.           Assessment & Plan:

## 2019-02-18 NOTE — Assessment & Plan Note (Signed)
Immunizations UTD. Declines Shingrix. Mammogram over due, ordered and pending. Colon cancer screening UTD, due in 2021. Discussed the importance of a healthy diet and regular exercise in order for weight loss, and to reduce the risk of any potential medical problems. Exam stable.  Labs reviewed. Follow up in 1 year for CPE.

## 2019-02-18 NOTE — Assessment & Plan Note (Signed)
Above goal. Failed numerous statins due to myalgias.  Will trial pravastatin 20 mg.  She will update.

## 2019-02-18 NOTE — Assessment & Plan Note (Signed)
Likely to bilateral corners of mouth, doesn't appear to be herpes or suspicious lesion. Will trial mupirocin ointment BID x 1 week then as needed. She will update.

## 2019-02-18 NOTE — Assessment & Plan Note (Signed)
Doing well on omeprazole 20 mg, continue same. 

## 2019-04-19 ENCOUNTER — Other Ambulatory Visit: Payer: Self-pay | Admitting: Primary Care

## 2019-04-19 ENCOUNTER — Other Ambulatory Visit: Payer: Self-pay

## 2019-04-19 ENCOUNTER — Ambulatory Visit
Admission: RE | Admit: 2019-04-19 | Discharge: 2019-04-19 | Disposition: A | Discharge status: Home / Self Care | Payer: BC Managed Care – PPO | Source: Ambulatory Visit | Attending: Primary Care | Admitting: Primary Care

## 2019-04-19 DIAGNOSIS — N63 Unspecified lump in unspecified breast: Secondary | ICD-10-CM

## 2019-04-19 DIAGNOSIS — N644 Mastodynia: Secondary | ICD-10-CM

## 2019-04-19 DIAGNOSIS — N6453 Retraction of nipple: Secondary | ICD-10-CM

## 2019-04-19 DIAGNOSIS — Z1239 Encounter for other screening for malignant neoplasm of breast: Secondary | ICD-10-CM

## 2019-04-25 ENCOUNTER — Other Ambulatory Visit: Payer: Self-pay | Admitting: Primary Care

## 2019-04-25 DIAGNOSIS — E119 Type 2 diabetes mellitus without complications: Secondary | ICD-10-CM

## 2019-04-25 DIAGNOSIS — E785 Hyperlipidemia, unspecified: Secondary | ICD-10-CM

## 2019-04-28 ENCOUNTER — Ambulatory Visit
Admission: RE | Admit: 2019-04-28 | Discharge: 2019-04-28 | Disposition: A | Discharge status: Home / Self Care | Payer: BC Managed Care – PPO | Source: Ambulatory Visit | Attending: Primary Care | Admitting: Primary Care

## 2019-04-28 ENCOUNTER — Other Ambulatory Visit: Payer: Self-pay | Admitting: Primary Care

## 2019-04-28 ENCOUNTER — Other Ambulatory Visit: Payer: Self-pay

## 2019-04-28 DIAGNOSIS — N6453 Retraction of nipple: Secondary | ICD-10-CM

## 2019-04-28 DIAGNOSIS — N644 Mastodynia: Secondary | ICD-10-CM

## 2019-04-28 DIAGNOSIS — N6320 Unspecified lump in the left breast, unspecified quadrant: Secondary | ICD-10-CM

## 2019-04-28 DIAGNOSIS — R920 Mammographic microcalcification found on diagnostic imaging of breast: Secondary | ICD-10-CM | POA: Diagnosis not present

## 2019-04-28 DIAGNOSIS — N63 Unspecified lump in unspecified breast: Secondary | ICD-10-CM

## 2019-04-28 DIAGNOSIS — N6324 Unspecified lump in the left breast, lower inner quadrant: Secondary | ICD-10-CM | POA: Diagnosis not present

## 2019-04-28 DIAGNOSIS — R921 Mammographic calcification found on diagnostic imaging of breast: Secondary | ICD-10-CM

## 2019-04-28 DIAGNOSIS — N6331 Unspecified lump in axillary tail of the right breast: Secondary | ICD-10-CM | POA: Diagnosis not present

## 2019-04-28 DIAGNOSIS — N6489 Other specified disorders of breast: Secondary | ICD-10-CM

## 2019-04-30 ENCOUNTER — Other Ambulatory Visit: Payer: Self-pay

## 2019-04-30 ENCOUNTER — Ambulatory Visit
Admission: RE | Admit: 2019-04-30 | Discharge: 2019-04-30 | Disposition: A | Discharge status: Home / Self Care | Payer: BC Managed Care – PPO | Source: Ambulatory Visit | Attending: Primary Care | Admitting: Primary Care

## 2019-04-30 DIAGNOSIS — N6011 Diffuse cystic mastopathy of right breast: Secondary | ICD-10-CM | POA: Diagnosis not present

## 2019-04-30 DIAGNOSIS — N6320 Unspecified lump in the left breast, unspecified quadrant: Secondary | ICD-10-CM

## 2019-04-30 DIAGNOSIS — N6311 Unspecified lump in the right breast, upper outer quadrant: Secondary | ICD-10-CM | POA: Diagnosis not present

## 2019-04-30 DIAGNOSIS — N6489 Other specified disorders of breast: Secondary | ICD-10-CM

## 2019-04-30 DIAGNOSIS — R921 Mammographic calcification found on diagnostic imaging of breast: Secondary | ICD-10-CM

## 2019-04-30 DIAGNOSIS — D0512 Intraductal carcinoma in situ of left breast: Secondary | ICD-10-CM | POA: Diagnosis not present

## 2019-04-30 DIAGNOSIS — N6321 Unspecified lump in the left breast, upper outer quadrant: Secondary | ICD-10-CM | POA: Diagnosis not present

## 2019-05-05 ENCOUNTER — Telehealth: Payer: Self-pay | Admitting: Primary Care

## 2019-05-05 ENCOUNTER — Other Ambulatory Visit: Payer: Self-pay | Admitting: Primary Care

## 2019-05-05 DIAGNOSIS — D0512 Intraductal carcinoma in situ of left breast: Secondary | ICD-10-CM

## 2019-05-05 NOTE — Telephone Encounter (Signed)
Patient returned call

## 2019-05-05 NOTE — Telephone Encounter (Addendum)
Per DPR, left detail message of Tawni Millers comments in result note for patient to call back.

## 2019-05-05 NOTE — Telephone Encounter (Signed)
Best number [phone] Pt returned call

## 2019-05-05 NOTE — Telephone Encounter (Signed)
Addressed in the result note.

## 2019-05-05 NOTE — Telephone Encounter (Signed)
Patient called and said [facility] sent an order yesterday requesting a Breast MRI.  Patient wanted to know if the order had been sent back to [facility]. They want to get the MRI done before patient's seen by the surgeon on 05/11/19.

## 2019-05-06 ENCOUNTER — Ambulatory Visit
Admission: RE | Admit: 2019-05-06 | Discharge: 2019-05-06 | Disposition: A | Discharge status: Home / Self Care | Payer: BC Managed Care – PPO | Source: Ambulatory Visit | Attending: Primary Care | Admitting: Primary Care

## 2019-05-06 ENCOUNTER — Other Ambulatory Visit: Payer: Self-pay

## 2019-05-06 DIAGNOSIS — D0512 Intraductal carcinoma in situ of left breast: Secondary | ICD-10-CM | POA: Diagnosis not present

## 2019-05-06 MED ORDER — GADOBUTROL 1 MMOL/ML IV SOLN
7.0000 mL | Freq: Once | INTRAVENOUS | Status: AC | PRN
  Administered 2019-05-06: 16:00:00 7 mL via INTRAVENOUS

## 2019-05-11 DIAGNOSIS — N6310 Unspecified lump in the right breast, unspecified quadrant: Secondary | ICD-10-CM | POA: Diagnosis not present

## 2019-05-11 DIAGNOSIS — D0512 Intraductal carcinoma in situ of left breast: Secondary | ICD-10-CM | POA: Diagnosis not present

## 2019-05-12 ENCOUNTER — Encounter: Payer: Self-pay | Admitting: Adult Health

## 2019-05-12 DIAGNOSIS — Z853 Personal history of malignant neoplasm of breast: Secondary | ICD-10-CM | POA: Insufficient documentation

## 2019-05-12 DIAGNOSIS — D0512 Intraductal carcinoma in situ of left breast: Secondary | ICD-10-CM | POA: Insufficient documentation

## 2019-05-13 ENCOUNTER — Telehealth: Payer: Self-pay

## 2019-05-13 NOTE — Telephone Encounter (Signed)

## 2019-05-14 ENCOUNTER — Ambulatory Visit (INDEPENDENT_AMBULATORY_CARE_PROVIDER_SITE_OTHER): Payer: BC Managed Care – PPO | Admitting: Plastic Surgery

## 2019-05-14 ENCOUNTER — Encounter: Payer: Self-pay | Admitting: Plastic Surgery

## 2019-05-14 ENCOUNTER — Other Ambulatory Visit: Payer: Self-pay

## 2019-05-14 ENCOUNTER — Other Ambulatory Visit: Payer: Self-pay | Admitting: Surgery

## 2019-05-14 VITALS — BP 145/84 | HR 85 | Temp 97.9°F | Resp 17 | Ht 65.0 in | Wt 140.0 lb

## 2019-05-14 DIAGNOSIS — Z171 Estrogen receptor negative status [ER-]: Secondary | ICD-10-CM

## 2019-05-14 DIAGNOSIS — E119 Type 2 diabetes mellitus without complications: Secondary | ICD-10-CM | POA: Diagnosis not present

## 2019-05-14 DIAGNOSIS — D0512 Intraductal carcinoma in situ of left breast: Secondary | ICD-10-CM

## 2019-05-14 DIAGNOSIS — C50412 Malignant neoplasm of upper-outer quadrant of left female breast: Secondary | ICD-10-CM

## 2019-05-14 NOTE — Progress Notes (Signed)
Patient ID: Miranda Greene, female    DOB: [birth year], 63 y.o.   MRN: [id number]   Chief Complaint  Patient presents with  . Breast Cancer    The patient is a 63 year old white female here for consultation for breast reconstruction.  She went for a routine mammogram.  She was subsequently diagnosed with bilateral breast cancer.  The left lesion is right under her nipple.  She has 4 Sons.  She is 5 feet 3 inches tall and weighs 140 pounds.  Her preoperative bra is a 36 B.  She would like to be around the same size.  Her past medical history is positive for diabetes, lupus, gastroesophageal reflux and hyperlipidemia.   Review of Systems  Constitutional: Negative for activity change and appetite change.  HENT: Negative.   Eyes: Negative.   Respiratory: Negative.   Cardiovascular: Negative.  Negative for leg swelling.  Gastrointestinal: Positive for abdominal pain.  Endocrine: Negative.   Genitourinary: Negative.   Musculoskeletal: Negative.  Negative for back pain.  Skin: Negative.  Negative for color change and wound.  Neurological: Negative.   Hematological: Negative.   Psychiatric/Behavioral: Negative.     Past Medical History:  Diagnosis Date  . Arthritis    Hands, feet  . Diabetes mellitus   . GERD (gastroesophageal reflux disease)   . Hyperlipidemia   . Lupus [facility])     Past Surgical History:  Procedure Laterality Date  . ABDOMINAL HYSTERECTOMY    . BLADDER SURGERY    . BREAST EXCISIONAL BIOPSY Right    benign 2009      Current Outpatient Medications:  .  ibuprofen (ADVIL,MOTRIN) 200 MG tablet, Take 200 mg by mouth every 6 (six) hours as needed., Disp: , Rfl:  .  Insulin Glargine (BASAGLAR KWIKPEN) 100 UNIT/ML SOPN, Inject 0.2 mLs (20 Units total) into the skin at bedtime., Disp: 15 mL, Rfl: 3 .  metFORMIN (GLUCOPHAGE) 1000 MG tablet, Take 1 tablet (1,000 mg total) by mouth 2 (two) times daily with a meal. For diabetes., Disp: 180 tablet, Rfl: 3 .  mupirocin  ointment (BACTROBAN) 2 %, Apply 1 application topically 2 (two) times daily., Disp: 30 g, Rfl: 0 .  omeprazole (PRILOSEC) 20 MG capsule, Take 20 mg by mouth daily., Disp: , Rfl:  .  pravastatin (PRAVACHOL) 20 MG tablet, Take 1 tablet (20 mg total) by mouth daily. For cholesterol., Disp: 90 tablet, Rfl: 3   Objective:   Vitals:   05/14/19 0830  BP: (!) 145/84  Pulse: 85  Resp: 17  Temp: 97.9 F (36.6 C)  SpO2: 97%    Physical Exam Vitals signs and nursing note reviewed.  Constitutional:      Appearance: Normal appearance.  HENT:     Head: Normocephalic and atraumatic.     Nose: Nose normal.  Cardiovascular:     Rate and Rhythm: Normal rate.     Pulses: Normal pulses.  Pulmonary:     Effort: Pulmonary effort is normal. No respiratory distress.     Breath sounds: No wheezing.  Abdominal:     General: Abdomen is flat. There is no distension.     Tenderness: There is no abdominal tenderness.  Skin:    General: Skin is warm.     Capillary Refill: Capillary refill takes less than 2 seconds.  Neurological:     General: No focal deficit present.     Mental Status: She is alert and oriented to person, place, and time.  Psychiatric:  Mood and Affect: Mood normal.        Behavior: Behavior normal.        Thought Content: Thought content normal.        Judgment: Judgment normal.     Assessment & Plan:  Type 2 diabetes mellitus without complication, without long-term current use of insulin (HCC)  Malignant neoplasm of upper-outer quadrant of left breast in female, estrogen receptor negative (HCC)  Assessment and Plan:  A long, detailed conversation was had regarding the patient's options for breast reconstruction. Five main points, which are explained to all breast reconstruction patients, were discussed.  1. Breast reconstruction is an optional process.  2. Breast reconstruction is a multi-stage process which involves multiple surgeries spaced several months apart. The  entire process can take over one year.  3. The major goal of breast reconstruction is to have the patient look normal in clothing. When naked, there will always be scars.  4. Asymmetries are often present during the reconstruction process. Several operations may be needed, including surgery to the non-cancerous breast, to achieve satisfactory results.  5. No matter the reconstructive method, there are ways that the reconstruction can fail and a secondary reconstructive plan would need to be created.   A general discussion regarding all available methods of breast reconstruction were discussed. The types of reconstructions described included.  1. Tissue expander and implant based reconstruction, both single and multi-stage approaches.  2. Autologous only reconstructions, including free abdominal-tissue based reconstructions.  3. Combination procedures, particularly latissismus dorsi flaps combined with either expanders or implants.  For each of the reconstruction methods mentioned above, the risks, benefits, alternatives, scarring, and recovery time were discussed in great detail. Specific risks detailed included bleeding, infection, hematoma, seroma, scarring, pain, wound healing complications, flap loss, fat necrosis, capsular contracture, need for implant removal, donor site complications, bulge, hernia, umbilical necrosis, need for urgent reoperation, and need for dressing changes were discussed.   Assessment  Once all reconstruction options were presented, a focused discussion was had regarding the patient's suitability for each of these procedures.  A total of 50 minutes of face-to-face time was spent in this encounter, of which >50% was spent in counseling. After discussing all the options the patient has decided on immediate breast reconstruction with expander and Flex HD placement.  She understands it will be a 61-month process.  She will come to the office for fills and then approximately 3  months later she will be ready for her exchange to implant.  She will then be eligible for nipple areolar reconstruction.  We will not try to save her nipples due to the location and severity of her disease.  I have discussed all the above with Dr. Ninfa Linden.  Pictures were obtained of the patient and placed in the chart with the patient's or guardian's permission.   [place], DO

## 2019-05-19 ENCOUNTER — Telehealth: Payer: Self-pay | Admitting: Hematology and Oncology

## 2019-05-19 NOTE — Telephone Encounter (Signed)
Received a new patient referral from Dr. Ninfa Linden for DCIS. Ms. Miranda Greene has been cld and scheduled to see Dr. Lindi Adie on 9/21 at 10:15am. She's agreed to the appt date and time.

## 2019-05-20 ENCOUNTER — Other Ambulatory Visit: Payer: Self-pay

## 2019-05-20 ENCOUNTER — Other Ambulatory Visit (INDEPENDENT_AMBULATORY_CARE_PROVIDER_SITE_OTHER): Payer: BC Managed Care – PPO

## 2019-05-20 DIAGNOSIS — E119 Type 2 diabetes mellitus without complications: Secondary | ICD-10-CM

## 2019-05-20 DIAGNOSIS — E785 Hyperlipidemia, unspecified: Secondary | ICD-10-CM | POA: Diagnosis not present

## 2019-05-20 LAB — HEPATIC FUNCTION PANEL
ALT: 10 U/L (ref 0–35)
AST: 14 U/L (ref 0–37)
Albumin: 4.1 g/dL (ref 3.5–5.2)
Alkaline Phosphatase: 64 U/L (ref 39–117)
Bilirubin, Direct: 0 mg/dL (ref 0.0–0.3)
Total Bilirubin: 0.5 mg/dL (ref 0.2–1.2)
Total Protein: 7.1 g/dL (ref 6.0–8.3)

## 2019-05-20 LAB — LIPID PANEL
Cholesterol: 164 mg/dL (ref 0–200)
HDL: 34.2 mg/dL — ABNORMAL LOW (ref >39.00)
LDL Cholesterol: 109 mg/dL — ABNORMAL HIGH (ref 0–99)
NonHDL: 129.94
Total CHOL/HDL Ratio: 5
Triglycerides: 107 mg/dL (ref 0.0–149.0)
VLDL: 21.4 mg/dL (ref 0.0–40.0)

## 2019-05-20 LAB — HEMOGLOBIN A1C: Hgb A1c MFr Bld: 7.8 % — ABNORMAL HIGH (ref 4.6–6.5)

## 2019-05-21 ENCOUNTER — Other Ambulatory Visit: Payer: Self-pay | Admitting: Family Medicine

## 2019-05-21 DIAGNOSIS — K130 Diseases of lips: Secondary | ICD-10-CM

## 2019-05-21 MED ORDER — NYSTATIN 100000 UNIT/GM EX CREA: 1.0000 "application " | TOPICAL_CREAM | Freq: Two times a day (BID) | CUTANEOUS | 0 refills | Status: DC

## 2019-05-24 ENCOUNTER — Encounter: Payer: Self-pay | Admitting: *Deleted

## 2019-05-28 NOTE — Progress Notes (Signed)
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 Breast, right, needle core biopsy, central - USUAL DUCTAL HYPERPLASIA, FIBROCYSTIC CHANGES AND ADENOSIS - CALCIFICATIONS - NO MALIGNANCY IDENTIFIED 2. Breast, left, needle core biopsy, upper outer quadrant - DUCTAL CARCINOMA IN SITU - CALCIFICATIONS 3. Breast, left, needle core biopsy, upper periareolar, 12 o'clock - DUCTAL CARCINOMA IN SITU - SEE COMMENT  Receptor Status: ER(negative), PR (negative)   Did patient present with symptoms (if so, please note symptoms) or was this found on screening mammography?: Over the last several months, she has been having increasing firmness develop in the left breast and retraction of the left nipple. She underwent screening mammography which showed multiple calcifications and abnormalities in the left breast as well as the right breast. She had 2 areas biopsied in the left breast showing both to be consistent with DCIS.  The right breast showed fibrocystic changes but this was felt to be discordant. She has since had an MRI of breast. The total area of concern in the breast measures greater than 7 cm. And involved both the upper outer and lower outer quadrant of the left breast. There is a separate area in the left breast at the 8:30 position which may need a biopsy as well. The right breast showed a hematoma at the biopsy site and changes measuring 3 cm. Surgical excision of this area is recommended.   She had a previous breast [facility] at [facility] around 2008.  This was benign. There is not family history of breast cancer. She denies nipple discharge. She does have a small sebaceous cyst in her right axilla for ultrasound.  Past/Anticipated interventions by surgeon, if any: 06/21/19:   Coralie Keens, MD BILATERAL MASTECTOMIES WITH LEFT SENTINEL LYMPH NODE BIOPSY  [place], Loel Lofty, DO BREAST RECONSTRUCTION WITH PLACEMENT OF TISSUE EXPANDER AND  FLEX HD (ACELLULAR HYDRATED DERMIS)  Past/Anticipated interventions by medical oncology, if any: Chemotherapy Initial consult 05/31/19 with Dr. Lindi Adie.  Lymphedema issues, if any:  Pt has not had surgery at this time.  Pain issues, if any:  Pt reports rare shooting pains in bilateral breasts and bilateral axillas.   SAFETY ISSUES:  Prior radiation? No  Pacemaker/ICD? No  Possible current pregnancy? No  Is the patient on methotrexate? No  Current Complaints / other details:  Pt presents today for initial consult with Dr. Sondra Come for Radiation Oncology.     Loma Sousa, RN 05/31/2019,7:47 AM

## 2019-05-30 NOTE — Progress Notes (Signed)
[facility] CONSULT NOTE  Patient Care Team: Pleas Koch, NP as PCP - General (Nurse Practitioner)  CHIEF COMPLAINTS/PURPOSE OF CONSULTATION:  Newly diagnosed breast cancer  HISTORY OF PRESENTING ILLNESS:  Miranda Greene 63 y.o. female is here because of recent diagnosis of left breast DCIS. The patient palpated a lump in her left breast and right axilla, and noted nipple retraction in her left breast. Diagnostic mammogram and [place] on 04/28/19 showed two areas of distortion in the right breast and a cyst in the right axilla. In the left breast, a group of calcifications spanning 3.8cm in the UOQ and additionally in the medial left breast, a mass at the 12:00 location measuring 1.6cm, a mass at the 8:30 location measuring 0.9cm, and no left axillary adenopathy. 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 Breast MRI on 05/06/19 showed 7.0cm biopsy proven DCIS extending from the left nipple to the UOQ and LOQ and a 1.0cm hematoma within a 3.9cm area of non mass enhancement. She presents to the clinic today for initial evaluation and discussion of treatment options.   I reviewed her records extensively and collaborated the history with the patient.  SUMMARY OF ONCOLOGIC HISTORY: Oncology History  Malignant neoplasm of upper-outer quadrant of left breast in female, estrogen receptor negative ([place])  04/28/2019 Initial Diagnosis   Patient palpated lumps in her left breast and right axilla, noted nipple retraction in her left breast. Mammogram and US showed two areas of distortion in the right breast and a cyst in the right axilla. In the left breast, a group of calcifications spanning 3.8cm in the UOQ and additionally in the medial left breast, a 1.6cm mass at the 12:00 location, a 0.9cm mass at the 8:30 location, no left axillary adenopathy. 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    04/30/2019 Cancer Staging   Staging form: Breast, AJCC 8th Edition - Clinical stage from 04/30/2019: Stage 0 (cTis (DCIS), cN0, cM0, ER-, PR-) - Signed by Gardenia Phlegm, NP on 05/12/2019   05/06/2019 Breast MRI   7.0cm biopsy proven DCIS extending from the left nipple to the UOQ and LOQ and a 1.0cm hematoma within a 3.9cm area of non mass enhancement.       MEDICAL HISTORY:  Past Medical History:  Diagnosis Date  . Arthritis    Hands, feet  . Diabetes mellitus   . GERD (gastroesophageal reflux disease)   . Hyperlipidemia   . Lupus ([place])     SURGICAL HISTORY: Past Surgical History:  Procedure Laterality Date  . ABDOMINAL HYSTERECTOMY    . BLADDER SURGERY    . BREAST EXCISIONAL BIOPSY Right    benign 2009    SOCIAL HISTORY: Social History   Socioeconomic History  . Marital status: Married    Spouse name: Not on file  . Number of children: Not on file  . Years of education: Not on file  . Highest education level: Not on file  Occupational History  . Not on file  Social Needs  . Financial resource strain: Not on file  . Food insecurity    Worry: Not on file    Inability: Not on file  . Transportation needs    Medical: Not on file    Non-medical: Not on file  Tobacco Use  . Smoking status: Never Smoker  . Smokeless  tobacco: Never Used  Substance and Sexual Activity  . Alcohol use: No  . Drug use: No  . Sexual activity: Not on file  Lifestyle  . Physical activity    Days per week: Not on file    Minutes per session: Not on file  . Stress: Not on file  Relationships  . Social Herbalist on phone: Not on file    Gets together: Not on file    Attends religious service: Not on file    Active member of club or organization: Not on file    Attends meetings of clubs or organizations: Not on file    Relationship status: Not on file  . Intimate partner violence    Fear of current or ex partner:  Not on file    Emotionally abused: Not on file    Physically abused: Not on file    Forced sexual activity: Not on file  Other Topics Concern  . Not on file  Social History Narrative   Married.   4 children.    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    FAMILY HISTORY: Family History  Problem Relation Age of Onset  . Alcohol abuse Father   . Lung cancer Father   . Heart disease Father   . Stroke Maternal Grandmother   . Arthritis Paternal Grandmother   . Heart disease Paternal Grandmother   . Diabetes Paternal Grandmother   . Arthritis Mother   . Stroke Mother   . Kidney disease Mother   . Diabetes Mother   . Dementia Mother     ALLERGIES:  is allergic to invokana [canagliflozin] and meloxicam.  MEDICATIONS:  Current Outpatient Medications  Medication Sig Dispense Refill  . ibuprofen (ADVIL,MOTRIN) 200 MG tablet Take 200 mg by mouth every 6 (six) hours as needed.    . Insulin Glargine (BASAGLAR KWIKPEN) 100 UNIT/ML SOPN Inject 0.2 mLs (20 Units total) into the skin at bedtime. (Patient not taking: Reported on 05/31/2019) 15 mL 3  . insulin glargine (LANTUS) 100 UNIT/ML injection Inject 20 Units into the skin daily.    . metFORMIN (GLUCOPHAGE) 1000 MG tablet Take 1 tablet (1,000 mg total) by mouth 2 (two) times daily with a meal. For diabetes. 180 tablet 3  . nystatin cream (MYCOSTATIN) Apply 1 application topically 2 (two) times daily. 30 g 0  . omeprazole (PRILOSEC) 20 MG capsule Take 20 mg by mouth daily.    . pravastatin (PRAVACHOL) 20 MG tablet Take 1 tablet (20 mg total) by mouth daily. For cholesterol. 90 tablet 3   No current facility-administered medications for this visit.     REVIEW OF SYSTEMS:   Constitutional: Denies fevers, chills or abnormal night sweats Eyes: Denies blurriness of vision, double vision or watery eyes Ears, nose, mouth, throat, and face: Denies mucositis or  sore throat Respiratory: Denies cough, dyspnea or wheezes Cardiovascular: Denies palpitation, chest discomfort or lower extremity swelling Gastrointestinal:  Denies nausea, heartburn or change in bowel habits Skin: Denies abnormal skin rashes Lymphatics: Denies new lymphadenopathy or easy bruising Neurological:Denies numbness, tingling or new weaknesses Behavioral/Psych: Mood is stable, no new changes  Breast: Palpable left breast mass and nipple retraction, right axilla mass All other systems were reviewed with the patient and are negative.  PHYSICAL EXAMINATION: ECOG PERFORMANCE STATUS: 0 - Asymptomatic  Vitals:   05/31/19 0935  BP: (!) 143/79  Pulse: 84  Resp: 18  Temp: 98.5 F (36.9 C)  SpO2: 98%   Filed Weights   05/31/19 0935  Weight: 141 lb 14.4 oz (64.4 kg)    GENERAL:alert, no distress and comfortable SKIN: skin color, texture, turgor are normal, no rashes or significant lesions EYES: normal, conjunctiva are pink and non-injected, sclera clear OROPHARYNX:no exudate, no erythema and lips, buccal mucosa, and tongue normal  NECK: supple, thyroid normal size, non-tender, without nodularity LYMPH:  no palpable lymphadenopathy in the cervical, axillary or inguinal LUNGS: clear to auscultation and percussion with normal breathing effort HEART: regular rate & rhythm and no murmurs and no lower extremity edema ABDOMEN:abdomen soft, non-tender and normal bowel sounds Musculoskeletal:no cyanosis of digits and no clubbing  PSYCH: alert & oriented x 3 with fluent speech NEURO: no focal motor/sensory deficits BREAST: No palpable nodules in breast. No palpable axillary or supraclavicular lymphadenopathy (exam performed in the presence of a chaperone)   LABORATORY DATA:  I have reviewed the data as listed Lab Results  Component Value Date   HGB 14.6 11/09/2011   HCT 43.0 11/09/2011   Lab Results  Component Value Date   NA 137 02/15/2019   K 4.4 02/15/2019   CL 102  02/15/2019   CO2 30 02/15/2019    RADIOGRAPHIC STUDIES: I have personally reviewed the radiological reports and agreed with the findings in the report.  ASSESSMENT AND PLAN:  Malignant neoplasm of upper-outer quadrant of left breast in female, estrogen receptor negative ([place]) 8/19/2020Patient palpated lumps in her left breast and right axilla, nipple retraction in her left breast. Mammogram and US showed two areas of distortion in the right breast and a cyst in the right axilla. In the left breast, a group of calcifications spanning 3.8cm in the UOQ and in the medial left breast, a 1.6cm mass at the 12:00 location, a 0.9cm mass at the 8:30 location, no left axillary adenopathy. 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   Breast MRI 05/07/2019: 7 cm biopsy-proven DCIS from the nipple to posterior aspect.  Right breast postbiopsy hematoma 1 cm, within a 3.9 cm patchy non-mass enhancement  Pathology review: I discussed with the patient the difference between DCIS and invasive breast cancer. It is considered a precancerous lesion. DCIS is classified as a 0. It is generally detected through mammograms as calcifications. We discussed the significance of grades and its impact on prognosis. We also discussed the importance of ER and PR receptors and their implications to adjuvant treatment options. Prognosis of DCIS dependence on grade, comedo necrosis. It is anticipated that if not treated, 20-30% of DCIS can develop into invasive breast cancer.  Recommendation: Left mastectomy and possibly right mastectomy as well There is no role of radiation or antiestrogen therapy. No role of chemotherapy unless the final pathology shows invasive breast cancer. Her son is getting married on 06/18/2019 but it is a small event in [place] and she may not be going there. Return to clinic after surgery to discuss final pathology report.    All  questions were answered. The patient knows to call the clinic with any problems, questions or concerns.   Rulon Eisenmenger, MD 05/31/2019    I, Molly Dorshimer, am acting as scribe for Nicholas Lose, MD.  I have reviewed the above documentation for accuracy and completeness, and I agree with the above.

## 2019-05-31 ENCOUNTER — Other Ambulatory Visit: Payer: Self-pay

## 2019-05-31 ENCOUNTER — Inpatient Hospital Stay: Payer: BC Managed Care – PPO | Attending: Hematology and Oncology | Admitting: Hematology and Oncology

## 2019-05-31 ENCOUNTER — Ambulatory Visit
Admission: RE | Admit: 2019-05-31 | Discharge: 2019-05-31 | Disposition: A | Discharge status: Home / Self Care | Payer: BC Managed Care – PPO | Source: Ambulatory Visit | Attending: Radiation Oncology | Admitting: Radiation Oncology

## 2019-05-31 ENCOUNTER — Encounter: Payer: Self-pay | Admitting: *Deleted

## 2019-05-31 VITALS — BP 134/85 | HR 87 | Temp 98.5°F | Resp 16 | Ht 63.0 in | Wt 142.4 lb

## 2019-05-31 DIAGNOSIS — Z794 Long term (current) use of insulin: Secondary | ICD-10-CM | POA: Diagnosis not present

## 2019-05-31 DIAGNOSIS — M329 Systemic lupus erythematosus, unspecified: Secondary | ICD-10-CM

## 2019-05-31 DIAGNOSIS — Z791 Long term (current) use of non-steroidal anti-inflammatories (NSAID): Secondary | ICD-10-CM

## 2019-05-31 DIAGNOSIS — Z86018 Personal history of other benign neoplasm: Secondary | ICD-10-CM | POA: Diagnosis not present

## 2019-05-31 DIAGNOSIS — Z801 Family history of malignant neoplasm of trachea, bronchus and lung: Secondary | ICD-10-CM | POA: Diagnosis not present

## 2019-05-31 DIAGNOSIS — C50412 Malignant neoplasm of upper-outer quadrant of left female breast: Secondary | ICD-10-CM | POA: Insufficient documentation

## 2019-05-31 DIAGNOSIS — Z171 Estrogen receptor negative status [ER-]: Secondary | ICD-10-CM | POA: Insufficient documentation

## 2019-05-31 DIAGNOSIS — M129 Arthropathy, unspecified: Secondary | ICD-10-CM | POA: Insufficient documentation

## 2019-05-31 DIAGNOSIS — Z79899 Other long term (current) drug therapy: Secondary | ICD-10-CM | POA: Diagnosis not present

## 2019-05-31 DIAGNOSIS — K219 Gastro-esophageal reflux disease without esophagitis: Secondary | ICD-10-CM | POA: Diagnosis not present

## 2019-05-31 DIAGNOSIS — Z7984 Long term (current) use of oral hypoglycemic drugs: Secondary | ICD-10-CM | POA: Insufficient documentation

## 2019-05-31 DIAGNOSIS — E785 Hyperlipidemia, unspecified: Secondary | ICD-10-CM | POA: Insufficient documentation

## 2019-05-31 DIAGNOSIS — E119 Type 2 diabetes mellitus without complications: Secondary | ICD-10-CM

## 2019-05-31 DIAGNOSIS — Z90710 Acquired absence of both cervix and uterus: Secondary | ICD-10-CM | POA: Insufficient documentation

## 2019-05-31 NOTE — Patient Instructions (Signed)
Coronavirus (COVID-19) Are you at risk?  Are you at risk for the Coronavirus (COVID-19)?  To be considered HIGH RISK for Coronavirus (COVID-19), you have to meet the following criteria:  . Traveled to [place], [place], [place], [place] or [place]; or in the [place] to [place], [place], [place], or [place]; and have fever, cough, and shortness of breath within the last 2 weeks of travel OR . Been in close contact with a person diagnosed with COVID-19 within the last 2 weeks and have fever, cough, and shortness of breath . IF YOU DO NOT MEET THESE CRITERIA, YOU ARE CONSIDERED LOW RISK FOR COVID-19.  What to do if you are HIGH RISK for COVID-19?  . If you are having a medical emergency, call 911. . Seek medical care right away. Before you go to a doctor's office, urgent care or emergency department, call ahead and tell them about your recent travel, contact with someone diagnosed with COVID-19, and your symptoms. You should receive instructions from your physician's office regarding next steps of care.  . When you arrive at healthcare provider, tell the healthcare staff immediately you have returned from visiting [place], [place], [place], [place] or [place]; or traveled in the [place] to [place], [place], [place], or [place]; in the last two weeks or you have been in close contact with a person diagnosed with COVID-19 in the last 2 weeks.   . Tell the health care staff about your symptoms: fever, cough and shortness of breath. . After you have been seen by a medical provider, you will be either: o Tested for (COVID-19) and discharged home on quarantine except to seek medical care if symptoms worsen, and asked to  - Stay home and avoid contact with others until you get your results (4-5 days)  - Avoid travel on public transportation if possible (such as bus, train, or airplane) or o Sent to the Emergency Department by EMS for evaluation, COVID-19 testing, and possible  admission depending on your condition and test results.  What to do if you are LOW RISK for COVID-19?  Reduce your risk of any infection by using the same precautions used for avoiding the common cold or flu:  . Wash your hands often with soap and warm water for at least 20 seconds.  If soap and water are not readily available, use an alcohol-based hand sanitizer with at least 60% alcohol.  . If coughing or sneezing, cover your mouth and nose by coughing or sneezing into the elbow areas of your shirt or coat, into a tissue or into your sleeve (not your hands). . Avoid shaking hands with others and consider head nods or verbal greetings only. . Avoid touching your eyes, nose, or mouth with unwashed hands.  . Avoid close contact with people who are sick. . Avoid places or events with large numbers of people in one location, like concerts or sporting events. . Carefully consider travel plans you have or are making. . If you are planning any travel outside or inside the [place], visit the CDC's Travelers' Health webpage for the latest health notices. . If you have some symptoms but not all symptoms, continue to monitor at home and seek medical attention if your symptoms worsen. . If you are having a medical emergency, call 911.   ADDITIONAL HEALTHCARE OPTIONS FOR PATIENTS  [facility] / e-Visit: [email or web address]         [facility]: [phone]  [facility]: [phone]                   [facility]: [phone]   

## 2019-05-31 NOTE — Progress Notes (Signed)
Radiation Oncology         [phone] ________________________________  Initial Outpatient Consultation  Name: Miranda Greene MRN: [id number]  Date: 05/31/2019  DOB: 04/23/1956  MA:UQJFH, Leticia Penna, NP  Coralie Keens, MD   REFERRING PHYSICIAN: Coralie Keens, MD  DIAGNOSIS: The encounter diagnosis was Malignant neoplasm of upper-outer quadrant of left breast in female, estrogen receptor negative ([place]).   Stage 0 (Tis, Nx) Left Breast UOQ, Ductal Carcinoma in Situ, ER- / PR-, Grade 3  HISTORY OF PRESENT ILLNESS::Miranda Greene is a 63 y.o. female who is accompanied by no one Due to COVID-19 restrictions. She has a history of benign breast lumpectomy, performed at [facility] in 2008. She presented with palpable abnormalities in the left breast and right axilla, as well as left nipple retraction.  She underwent bilateral diagnostic mammography with tomography and ultrasonography on 04/28/2019 showing: two areas of distortion in the right breast; suspicious mass in the left breast at 12 o'clock; suspicious microcalcifications extending into the lateral left breast, spanning 3.8 cm; suspicious distortion and mass in medial left breast; no adenopathy in either axilla, palpable right axilla abnormality corresponds to benign cyst.  On 04/30/2019, she proceeded to right and left biopsies with pathology showing: no malignancy in the right; DCIS in both left cores, comedo pattern, high nuclear grade. Prognostic indicators significant for: ER, 0% and PR, 0%.  She underwent bilateral breast MRI on 05/06/2019, which showed: 7 cm area of biopsy-proven DCIS involving left nipple and upper-outer and lower-outer quadranst of the left breast; 1 cm post biopsy hematoma; no additional areas of concern; no adenopathy.  She met with Dr. Ninfa Linden on 05/11/2019 and with Dr. Marla Roe on 05/14/2019. She is currently scheduled for bilateral mastectomies with left senitnel lymph node biopsy and reconstruction on  06/21/2019.   She is scheduled to see Dr. Lindi Adie later this morning.   PREVIOUS RADIATION THERAPY: No  PAST MEDICAL HISTORY:  Past Medical History:  Diagnosis Date   Arthritis    Hands, feet   Diabetes mellitus    GERD (gastroesophageal reflux disease)    Hyperlipidemia    Lupus ([place])     PAST SURGICAL HISTORY: Past Surgical History:  Procedure Laterality Date   ABDOMINAL HYSTERECTOMY     BLADDER SURGERY     BREAST EXCISIONAL BIOPSY Right    benign 2009    FAMILY HISTORY:  Family History  Problem Relation Age of Onset   Alcohol abuse Father    Lung cancer Father    Heart disease Father    Stroke Maternal Grandmother    Arthritis Paternal Grandmother    Heart disease Paternal Grandmother    Diabetes Paternal Grandmother    Arthritis Mother    Stroke Mother    Kidney disease Mother    Diabetes Mother    Dementia Mother     SOCIAL HISTORY:  Social History   Tobacco Use   Smoking status: Never Smoker   Smokeless tobacco: Never Used  Substance Use Topics   Alcohol use: No   Drug use: No    ALLERGIES:  Allergies  Allergen Reactions   Invokana [Canagliflozin] Rash   Meloxicam Hives    MEDICATIONS:  Current Outpatient Medications  Medication Sig Dispense Refill   ibuprofen (ADVIL,MOTRIN) 200 MG tablet Take 200 mg by mouth every 6 (six) hours as needed.     insulin glargine (LANTUS) 100 UNIT/ML injection Inject 20 Units into the skin daily.     metFORMIN (GLUCOPHAGE) 1000 MG tablet Take 1  tablet (1,000 mg total) by mouth 2 (two) times daily with a meal. For diabetes. 180 tablet 3   nystatin cream (MYCOSTATIN) Apply 1 application topically 2 (two) times daily. 30 g 0   omeprazole (PRILOSEC) 20 MG capsule Take 20 mg by mouth daily.     pravastatin (PRAVACHOL) 20 MG tablet Take 1 tablet (20 mg total) by mouth daily. For cholesterol. 90 tablet 3   Insulin Glargine (BASAGLAR KWIKPEN) 100 UNIT/ML SOPN Inject 0.2 mLs (20 Units  total) into the skin at bedtime. (Patient not taking: Reported on 05/31/2019) 15 mL 3   No current facility-administered medications for this encounter.     REVIEW OF SYSTEMS:  A 10+ POINT REVIEW OF SYSTEMS WAS OBTAINED including neurology, dermatology, psychiatry, cardiac, respiratory, lymph, extremities, GI, GU, musculoskeletal, constitutional, reproductive, HEENT. She reports rare shooting pains in bilateral breasts and axillae. She denies any other issues at this time.   PHYSICAL EXAM:  height is 5' 3"  (1.6 m) and weight is 142 lb 6.4 oz (64.6 kg). Her temporal temperature is 98.5 F (36.9 C). Her blood pressure is 134/85 and her pulse is 87. Her respiration is 16 and oxygen saturation is 100%.   General: Alert and oriented, in no acute distress HEENT: Head is normocephalic. Extraocular movements are intact. Oropharynx is clear. Neck: Neck is supple, no palpable cervical or supraclavicular lymphadenopathy. Heart: Regular in rate and rhythm with no murmurs, rubs, or gallops. Chest: Clear to auscultation bilaterally, with no rhonchi, wheezes, or rales. Abdomen: Soft, nontender, nondistended, with no rigidity or guarding. Extremities: No cyanosis or edema. Lymphatics: see Neck Exam Skin: No concerning lesions. Musculoskeletal: symmetric strength and muscle tone throughout. Neurologic: Cranial nerves II through XII are grossly intact. No obvious focalities. Speech is fluent. Coordination is intact. Psychiatric: Judgment and insight are intact. Affect is appropriate. Right Breast: No palpable mass nipple discharge or bleeding Left Breast: Some nipple retraction noted.  The upper aspect of the breast has appears to be a large palpable mass in the mid to the upper inner and upper outer quadrants  ECOG = 1  0 - Asymptomatic (Fully active, able to carry on all predisease activities without restriction)  1 - Symptomatic but completely ambulatory (Restricted in physically strenuous activity but  ambulatory and able to carry out work of a light or sedentary nature. For example, light housework, office work)  2 - Symptomatic, <50% in bed during the day (Ambulatory and capable of all self care but unable to carry out any work activities. Up and about more than 50% of waking hours)  3 - Symptomatic, >50% in bed, but not bedbound (Capable of only limited self-care, confined to bed or chair 50% or more of waking hours)  4 - Bedbound (Completely disabled. Cannot carry on any self-care. Totally confined to bed or chair)  5 - Death   Eustace Pen MM, Creech RH, Tormey DC, et al. [phone]). "Toxicity and response criteria of the [facility]". [place] Oncol. 5 (6): 649-55  LABORATORY DATA:  Lab Results  Component Value Date   HGB 14.6 11/09/2011   HCT 43.0 11/09/2011   Lab Results  Component Value Date   NA 137 02/15/2019   K 4.4 02/15/2019   CL 102 02/15/2019   CO2 30 02/15/2019   GLUCOSE 172 (H) 02/15/2019   CREATININE 0.79 02/15/2019   CALCIUM 9.5 02/15/2019      RADIOGRAPHY: Mr Breast Bilateral W Wo Contrast Inc Cad  Result Date: 05/07/2019 CLINICAL DATA:  Recently diagnosed ductal carcinoma in situ in the upper-outer quadrant of the left breast and high-grade ductal carcinoma in situ in the 12 o'clock periareolar left breast. The patient also had architectural distortion in the right breast with stereotactic guided core needle biopsy of distortion in the central right breast demonstrating usual ductal hyperplasia, fibrocystic changes and adenosis with calcifications, felt to be discordant. There was at least 1 additional area of distortion in the right breast not biopsied and an additional suspicious mass in the 8:30 o'clock position of the left breast not biopsied. Further evaluation with MRI prior to additional biopsies was recommended. LABS:  Creatinine was obtained on site at [facility] at [address] Results: Creatinine 0.9 mg/dL. EXAM:  BILATERAL BREAST MRI WITH AND WITHOUT CONTRAST TECHNIQUE: Multiplanar, multisequence MR images of both breasts were obtained prior to and following the intravenous administration of 7 ml of Gadavist Three-dimensional MR images were rendered by post-processing of the original MR data on an independent workstation. The three-dimensional MR images were interpreted, and findings are reported in the following complete MRI report for this study. Three dimensional images were evaluated at the independent DynaCad workstation COMPARISON:  Previous mammogram, ultrasound and biopsy examinations. FINDINGS: Breast composition: c. Heterogeneous fibroglandular tissue. Background parenchymal enhancement: Mild to moderate. Right breast: 1.0 cm post biopsy hematoma in the central right breast slightly superiorly and slightly laterally at the location of recently biopsied distortion with benign results felt to be discordant. This is within an area of patchy non mass enhancement extending from the anterior to the posterior aspect of the central right breast slightly superiorly and slightly laterally. This area measures 3.9 x 3.5 x 2.1 cm and has a mixture of enhancement kinetics, including rapid wash-in/washout. This includes a superiorly located biopsy tract. On a Cough the superiorly migrated post biopsy marker clip artifact is demonstrated at the superior, anterior aspect of this area of non mass enhancement. No mass or additional enhancement suspicious for malignancy in the right breast. There is normal appearing and normally enhancing fibroglandular tissue in the posterior aspect of the outer right breast at the location of a 2nd area of previously suspected distortion. No distortion is seen on the MR images and this appears subtle and questionable on the previous mammogram images. Left breast: Inverted left nipple with diffuse heterogeneous enhancement of the nipple with linear and patchy non mass enhancement extending from the  nipple posteriorly into the posterior aspect of the lateral portion of the left breast. This is involving the upper outer and lower outer quadrants of the breast and measures 7.0 x 6.3 x 2.5 cm. This demonstrates a mixture of enhancement kinetics, including rapid wash-in/washout. There is a small amount of normal appearing parenchymal enhancement in the lower inner quadrant of the left breast in the region of the recently demonstrated suspicious mass in the 8:30 o'clock position. No discrete mass, distortion or enhancement suspicious for malignancy is seen at that location on the MR images. The suspected distortion in this area of the breast on the previous mammogram images appears subtle and questionable on those images. Lymph nodes: No abnormal appearing lymph nodes. Ancillary findings:  None. IMPRESSION: 1. 7.0 x 6.3 x 2.5 cm area of biopsy-proven ductal carcinoma in situ involving the left nipple and extending into the posterior aspect of the breast, involving the upper outer and lower outer quadrants of the breast. 2. 1.0 cm post biopsy hematoma within the central portion of a 3.9 x 3.5 x 2.1 cm area  of patchy non mass enhancement with a superiorly migrated biopsy marker clip within a superior biopsy tract. Some or all of this enhancement could be related to the recent biopsy. 3. No additional areas of concern on the MR images in either breast. 4. No adenopathy. RECOMMENDATION: 1. Treatment plan for biopsy proven DCIS involving the left nipple and extending into the posterior aspect of the breast in the upper outer and lower outer quadrants. 2. Mammographic or sonographic localization and surgical excision of the post biopsy hematoma and surrounding tissues in the central right breast slightly superiorly and slightly laterally. 3. If any additional biopsies of the left breast are clinically indicated, then ultrasound-guided core needle biopsy of the mass seen at recent ultrasound in the 8:30 o'clock position  would be recommended. 4. No additional right breast biopsies are indicated. BI-RADS CATEGORY  4: Suspicious. Electronically Signed   By: Claudie Revering M.D.   On: 05/07/2019 08:59      IMPRESSION: Stage 0 (Tis, Nx) Left Breast UOQ, Ductal Carcinoma in Situ, ER- / PR-, Grade 3  Patient has a large mass within the left breast would not be a good candidate for lumpectomy and adjuvant radiation therapy.  Surgical approach would need to be mastectomy in this situation.  Patient is very comfortable with this surgical approach and is already decided to proceed with bilateral mastectomies and immediate reconstruction.  Today I discussed the potential indications for postmastectomy radiation therapy in the event that she is found to have a large invasive tumor or positive lymph nodes.    PLAN: PRN follow-up in radiation oncology.  She will need to be seen after surgery if she is been found to have a large invasive tumor or positive lymph nodes.    ------------------------------------------------  Blair Promise, PhD, MD  This document serves as a record of services personally performed by Gery Pray, MD. It was created on his behalf by Wilburn Mylar, a trained medical scribe. The creation of this record is based on the scribe's personal observations and the provider's statements to them. This document has been checked and approved by the attending provider.

## 2019-05-31 NOTE — Assessment & Plan Note (Addendum)
8/19/2020Patient palpated lumps in her left breast and right axilla, nipple retraction in her left breast. Mammogram and US showed two areas of distortion in the right breast and a cyst in the right axilla. In the left breast, a group of calcifications spanning 3.8cm in the UOQ and in the medial left breast, a 1.6cm mass at the 12:00 location, a 0.9cm mass at the 8:30 location, no left axillary adenopathy. 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   Breast MRI 05/07/2019: 7 cm biopsy-proven DCIS from the nipple to posterior aspect.  Right breast postbiopsy hematoma 1 cm, within a 3.9 cm patchy non-mass enhancement  Pathology review: I discussed with the patient the difference between DCIS and invasive breast cancer. It is considered a precancerous lesion. DCIS is classified as a 0. It is generally detected through mammograms as calcifications. We discussed the significance of grades and its impact on prognosis. We also discussed the importance of ER and PR receptors and their implications to adjuvant treatment options. Prognosis of DCIS dependence on grade, comedo necrosis. It is anticipated that if not treated, 20-30% of DCIS can develop into invasive breast cancer.  Recommendation: 1.  Left mastectomy and possibly right mastectomy as well 2. Followed by antiestrogen therapy with tamoxifen 5 years if she does not undergo bilateral mastectomies  Tamoxifen counseling: We discussed the risks and benefits of tamoxifen. These include but not limited to insomnia, hot flashes, mood changes, vaginal dryness, and weight gain. Although rare, serious side effects including endometrial cancer, risk of blood clots were also discussed. We strongly believe that the benefits far outweigh the risks. Patient understands these risks and consented to starting treatment. Planned treatment duration is 5 years.  Return to clinic after surgery to discuss the  final pathology report

## 2019-06-01 ENCOUNTER — Telehealth: Payer: Self-pay | Admitting: Hematology and Oncology

## 2019-06-01 NOTE — Telephone Encounter (Signed)
I talk with patient regarding schedule  

## 2019-06-03 ENCOUNTER — Telehealth: Payer: Self-pay

## 2019-06-03 ENCOUNTER — Other Ambulatory Visit (HOSPITAL_COMMUNITY): Payer: BC Managed Care – PPO

## 2019-06-03 NOTE — Telephone Encounter (Signed)

## 2019-06-04 ENCOUNTER — Encounter: Payer: Self-pay | Admitting: Plastic Surgery

## 2019-06-04 ENCOUNTER — Other Ambulatory Visit: Payer: Self-pay

## 2019-06-04 ENCOUNTER — Ambulatory Visit (INDEPENDENT_AMBULATORY_CARE_PROVIDER_SITE_OTHER): Payer: BC Managed Care – PPO | Admitting: Plastic Surgery

## 2019-06-04 VITALS — BP 126/78 | HR 83 | Temp 97.7°F | Ht 63.0 in | Wt 142.0 lb

## 2019-06-04 DIAGNOSIS — E119 Type 2 diabetes mellitus without complications: Secondary | ICD-10-CM

## 2019-06-04 DIAGNOSIS — C50412 Malignant neoplasm of upper-outer quadrant of left female breast: Secondary | ICD-10-CM

## 2019-06-04 DIAGNOSIS — Z171 Estrogen receptor negative status [ER-]: Secondary | ICD-10-CM

## 2019-06-04 MED ORDER — DIAZEPAM 2 MG PO TABS: 2.0000 mg | ORAL_TABLET | Freq: Two times a day (BID) | ORAL | 0 refills | Status: AC | PRN

## 2019-06-04 MED ORDER — CEPHALEXIN 500 MG PO CAPS: 500.0000 mg | ORAL_CAPSULE | Freq: Four times a day (QID) | ORAL | 0 refills | Status: AC

## 2019-06-04 MED ORDER — ONDANSETRON HCL 4 MG PO TABS: 4.0000 mg | ORAL_TABLET | Freq: Three times a day (TID) | ORAL | 0 refills | Status: AC | PRN

## 2019-06-04 MED ORDER — HYDROCODONE-ACETAMINOPHEN 5-325 MG PO TABS: 1.0000 | ORAL_TABLET | Freq: Three times a day (TID) | ORAL | 0 refills | Status: AC | PRN

## 2019-06-04 NOTE — H&P (View-Only) (Signed)
Patient ID: Miranda Greene, female    DOB: Feb 14, 1956, 62 y.o.   MRN: [id number]   Chief Complaint  Patient presents with  . Pre-op Exam    (B) breast reconstruction w/expanders and Flex HD  . Breast Problem  . Breast Cancer    The patient is a 63 year old white female here for her preoperative H&P for breast reconstruction.  She has bilateral breast cancer with a plan for bilateral mastectomies.  She is 5 feet 3 inches tall and weighs 140 pounds.  Her preoperative bra size is a 36 B.  She is hoping to be around the same size.  She has several medical conditions that include diabetes lupus gastric reflux and hyperlipidemia.   Review of Systems  Constitutional: Negative.  Negative for activity change and appetite change.  Eyes: Negative.   Respiratory: Negative.   Cardiovascular: Negative.   Gastrointestinal: Negative.   Endocrine: Negative.   Genitourinary: Negative.   Musculoskeletal: Negative.   Skin: Negative.   Neurological: Negative.   Psychiatric/Behavioral: Negative.     Past Medical History:  Diagnosis Date  . Arthritis    Hands, feet  . Diabetes mellitus   . GERD (gastroesophageal reflux disease)   . Hyperlipidemia   . Lupus [facility])     Past Surgical History:  Procedure Laterality Date  . ABDOMINAL HYSTERECTOMY    . BLADDER SURGERY    . BREAST EXCISIONAL BIOPSY Right    benign 2009      Current Outpatient Medications:  .  ibuprofen (ADVIL,MOTRIN) 200 MG tablet, Take 200 mg by mouth every 6 (six) hours as needed., Disp: , Rfl:  .  insulin glargine (LANTUS) 100 UNIT/ML injection, Inject 20 Units into the skin daily., Disp: , Rfl:  .  metFORMIN (GLUCOPHAGE) 1000 MG tablet, Take 1 tablet (1,000 mg total) by mouth 2 (two) times daily with a meal. For diabetes., Disp: 180 tablet, Rfl: 3 .  nystatin cream (MYCOSTATIN), Apply 1 application topically 2 (two) times daily., Disp: 30 g, Rfl: 0 .  omeprazole (PRILOSEC) 20 MG capsule, Take 20 mg by mouth daily., Disp:  , Rfl:  .  pravastatin (PRAVACHOL) 20 MG tablet, Take 1 tablet (20 mg total) by mouth daily. For cholesterol., Disp: 90 tablet, Rfl: 3 .  cephALEXin (KEFLEX) 500 MG capsule, Take 1 capsule (500 mg total) by mouth 4 (four) times daily for 5 days., Disp: 20 capsule, Rfl: 0 .  diazepam (VALIUM) 2 MG tablet, Take 1 tablet (2 mg total) by mouth every 12 (twelve) hours as needed for up to 10 days for muscle spasms., Disp: 20 tablet, Rfl: 0 .  HYDROcodone-acetaminophen (NORCO) 5-325 MG tablet, Take 1 tablet by mouth every 8 (eight) hours as needed for up to 7 days., Disp: 20 tablet, Rfl: 0 .  Insulin Glargine (BASAGLAR KWIKPEN) 100 UNIT/ML SOPN, Inject 0.2 mLs (20 Units total) into the skin at bedtime. (Patient not taking: Reported on 05/31/2019), Disp: 15 mL, Rfl: 3 .  ondansetron (ZOFRAN) 4 MG tablet, Take 1 tablet (4 mg total) by mouth every 8 (eight) hours as needed for up to 5 days., Disp: 15 tablet, Rfl: 0   Objective:   Vitals:   06/04/19 0942  BP: 126/78  Pulse: 83  Temp: 97.7 F (36.5 C)  SpO2: 97%    Physical Exam Vitals signs and nursing note reviewed.  Constitutional:      Appearance: Normal appearance.  Eyes:     Extraocular Movements: Extraocular movements intact.  Cardiovascular:  Rate and Rhythm: Normal rate.     Pulses: Normal pulses.  Pulmonary:     Effort: Pulmonary effort is normal.  Abdominal:     General: Abdomen is flat. There is no distension.     Tenderness: There is no abdominal tenderness.  Skin:    General: Skin is warm.  Neurological:     General: No focal deficit present.     Mental Status: She is alert and oriented to person, place, and time.  Psychiatric:        Mood and Affect: Mood normal.        Behavior: Behavior normal.        Thought Content: Thought content normal.     Assessment & Plan:  Malignant neoplasm of upper-outer quadrant of left breast in female, estrogen receptor negative ([place])  Type 2 diabetes mellitus without complication,  without long-term current use of insulin ([place])  The risks that can be encountered with and after breast surgery and placement of flex HD and expanders were discussed and include the following but not limited to these: bleeding, infection, delayed healing, anesthesia risks, skin sensation changes, injury to structures including nerves, blood vessels, and muscles which may be temporary or permanent, allergies to tape, suture materials and glues, blood products, topical preparations or injected agents, skin contour irregularities, skin discoloration and swelling, deep vein thrombosis, cardiac and pulmonary complications, pain, which may persist, fluid accumulation, wrinkling of the skin over the expander, changes in nipple or breast sensation, expander leakage or rupture, faulty position of the expander, persistent pain, formation of tight scar tissue around the expander (capsular contracture), possible need for revisional surgery or staged procedures.  Prescriptions sent to pharmacy. Plan for bilateral breast reconstruction with expanders and flex HD placement.  Due to the location of the tumor to the nipple areola we are doing a skin sparing but not nipple sparing procedure.  [place], DO

## 2019-06-04 NOTE — Progress Notes (Signed)
Patient ID: Miranda Greene, female    DOB: 09-Oct-1955, 63 y.o.   MRN: [id number]   Chief Complaint  Patient presents with  . Pre-op Exam    (B) breast reconstruction w/expanders and Flex HD  . Breast Problem  . Breast Cancer    The patient is a 63 year old white female here for her preoperative H&P for breast reconstruction.  She has bilateral breast cancer with a plan for bilateral mastectomies.  She is 5 feet 3 inches tall and weighs 140 pounds.  Her preoperative bra size is a 36 B.  She is hoping to be around the same size.  She has several medical conditions that include diabetes lupus gastric reflux and hyperlipidemia.   Review of Systems  Constitutional: Negative.  Negative for activity change and appetite change.  Eyes: Negative.   Respiratory: Negative.   Cardiovascular: Negative.   Gastrointestinal: Negative.   Endocrine: Negative.   Genitourinary: Negative.   Musculoskeletal: Negative.   Skin: Negative.   Neurological: Negative.   Psychiatric/Behavioral: Negative.     Past Medical History:  Diagnosis Date  . Arthritis    Hands, feet  . Diabetes mellitus   . GERD (gastroesophageal reflux disease)   . Hyperlipidemia   . Lupus [facility])     Past Surgical History:  Procedure Laterality Date  . ABDOMINAL HYSTERECTOMY    . BLADDER SURGERY    . BREAST EXCISIONAL BIOPSY Right    benign 2009      Current Outpatient Medications:  .  ibuprofen (ADVIL,MOTRIN) 200 MG tablet, Take 200 mg by mouth every 6 (six) hours as needed., Disp: , Rfl:  .  insulin glargine (LANTUS) 100 UNIT/ML injection, Inject 20 Units into the skin daily., Disp: , Rfl:  .  metFORMIN (GLUCOPHAGE) 1000 MG tablet, Take 1 tablet (1,000 mg total) by mouth 2 (two) times daily with a meal. For diabetes., Disp: 180 tablet, Rfl: 3 .  nystatin cream (MYCOSTATIN), Apply 1 application topically 2 (two) times daily., Disp: 30 g, Rfl: 0 .  omeprazole (PRILOSEC) 20 MG capsule, Take 20 mg by mouth daily., Disp:  , Rfl:  .  pravastatin (PRAVACHOL) 20 MG tablet, Take 1 tablet (20 mg total) by mouth daily. For cholesterol., Disp: 90 tablet, Rfl: 3 .  cephALEXin (KEFLEX) 500 MG capsule, Take 1 capsule (500 mg total) by mouth 4 (four) times daily for 5 days., Disp: 20 capsule, Rfl: 0 .  diazepam (VALIUM) 2 MG tablet, Take 1 tablet (2 mg total) by mouth every 12 (twelve) hours as needed for up to 10 days for muscle spasms., Disp: 20 tablet, Rfl: 0 .  HYDROcodone-acetaminophen (NORCO) 5-325 MG tablet, Take 1 tablet by mouth every 8 (eight) hours as needed for up to 7 days., Disp: 20 tablet, Rfl: 0 .  Insulin Glargine (BASAGLAR KWIKPEN) 100 UNIT/ML SOPN, Inject 0.2 mLs (20 Units total) into the skin at bedtime. (Patient not taking: Reported on 05/31/2019), Disp: 15 mL, Rfl: 3 .  ondansetron (ZOFRAN) 4 MG tablet, Take 1 tablet (4 mg total) by mouth every 8 (eight) hours as needed for up to 5 days., Disp: 15 tablet, Rfl: 0   Objective:   Vitals:   06/04/19 0942  BP: 126/78  Pulse: 83  Temp: 97.7 F (36.5 C)  SpO2: 97%    Physical Exam Vitals signs and nursing note reviewed.  Constitutional:      Appearance: Normal appearance.  Eyes:     Extraocular Movements: Extraocular movements intact.  Cardiovascular:  Rate and Rhythm: Normal rate.     Pulses: Normal pulses.  Pulmonary:     Effort: Pulmonary effort is normal.  Abdominal:     General: Abdomen is flat. There is no distension.     Tenderness: There is no abdominal tenderness.  Skin:    General: Skin is warm.  Neurological:     General: No focal deficit present.     Mental Status: She is alert and oriented to person, place, and time.  Psychiatric:        Mood and Affect: Mood normal.        Behavior: Behavior normal.        Thought Content: Thought content normal.     Assessment & Plan:  Malignant neoplasm of upper-outer quadrant of left breast in female, estrogen receptor negative ([place])  Type 2 diabetes mellitus without complication,  without long-term current use of insulin ([place])  The risks that can be encountered with and after breast surgery and placement of flex HD and expanders were discussed and include the following but not limited to these: bleeding, infection, delayed healing, anesthesia risks, skin sensation changes, injury to structures including nerves, blood vessels, and muscles which may be temporary or permanent, allergies to tape, suture materials and glues, blood products, topical preparations or injected agents, skin contour irregularities, skin discoloration and swelling, deep vein thrombosis, cardiac and pulmonary complications, pain, which may persist, fluid accumulation, wrinkling of the skin over the expander, changes in nipple or breast sensation, expander leakage or rupture, faulty position of the expander, persistent pain, formation of tight scar tissue around the expander (capsular contracture), possible need for revisional surgery or staged procedures.  Prescriptions sent to pharmacy. Plan for bilateral breast reconstruction with expanders and flex HD placement.  Due to the location of the tumor to the nipple areola we are doing a skin sparing but not nipple sparing procedure.  [place], DO

## 2019-06-07 ENCOUNTER — Ambulatory Visit (HOSPITAL_COMMUNITY): Payer: BC Managed Care – PPO

## 2019-06-15 NOTE — Pre-Procedure Instructions (Signed)
Miranda Greene  06/15/2019      [facility] [address], [place] [address] Phone: [phone] Fax: [phone]    Your procedure is scheduled on Oct. 12  Report to [facility] Entrance A at 9:15A.M.  Call this number if you have problems the morning of surgery:  [phone]   Remember:  Do not eat after midnight.     DRINK 2 GATORADE TONIGHT.   You may drink clear liquids until 8:15.  Clear liquids allowed are:                    Water, Juice (non-citric and without pulp), Carbonated beverages, Clear Tea, Black Coffee only, Plain Jell-O only, Gatorade and Plain Popsicles only               Please complete your PRE-GATORADE  DRINK that was provided to you by 8:15 the morning of surgery.  Please, if able, drink it in one setting. DO NOT SIP.    Take these medicines the morning of surgery with A SIP OF WATER :  Omeprazole (prilosec) Pravastatin (pravachol)    7 days prior to surgery STOP taking any Aspirin (unless otherwise instructed by your surgeon), Aleve, Naproxen, Ibuprofen, Motrin, Advil, Goody's, BC's, all herbal medications, fish oil, and all vitamins.                   How to Manage Your Diabetes Before and After Surgery  Why is it important to control my blood sugar before and after surgery? . Improving blood sugar levels before and after surgery helps healing and can limit problems. . A way of improving blood sugar control is eating a healthy diet by: o  Eating less sugar and carbohydrates o  Increasing activity/exercise o  Talking with your doctor about reaching your blood sugar goals . High blood sugars (greater than 180 mg/dL) can raise your risk of infections and slow your recovery, so you will need to focus on controlling your diabetes during the weeks before surgery. . Make sure that the doctor who takes care of your diabetes knows about your planned surgery including  the date and location.  How do I manage my blood sugar before surgery? . Check your blood sugar at least 4 times a day, starting 2 days before surgery, to make sure that the level is not too high or low. o Check your blood sugar the morning of your surgery when you wake up and every 2 hours until you get to the Short Stay unit. . If your blood sugar is less than 70 mg/dL, you will need to treat for low blood sugar: o Do not take insulin. o Treat a low blood sugar (less than 70 mg/dL) with  cup of clear juice (cranberry or apple), 4 glucose tablets, OR glucose gel. Recheck blood sugar in 15 minutes after treatment (to make sure it is greater than 70 mg/dL). If your blood sugar is not greater than 70 mg/dL on recheck, call [phone] o  for further instructions. . Report your blood sugar to the short stay nurse when you get to Short Stay.  . If you are admitted to the hospital after surgery: o Your blood sugar will be checked by the staff and you will probably be given insulin after surgery (instead of oral diabetes medicines) to make sure you have good blood sugar levels. o The goal for blood  sugar control after surgery is 80-180 mg/dL.       WHAT DO I DO ABOUT MY DIABETES MEDICATION?   Marland Kitchen Do not take oral diabetes medicines (pills) the morning of surgery.  . THE NIGHT BEFORE SURGERY, take _______10____ units of ____LANTUS_______insulin.         Do not wear jewelry, make-up or nail polish.  Do not wear lotions, powders, or perfumes, or deodorant.  Do not shave 48 hours prior to surgery.  Men may shave face and neck.  Do not bring valuables to the hospital.  [facility] is not responsible for any belongings or valuables.  Contacts, dentures or bridgework may not be worn into surgery.  Leave your suitcase in the car.  After surgery it may be brought to your room.  For patients admitted to the hospital, discharge time will be determined by your treatment team.  Patients discharged  the day of surgery will not be allowed to drive home.    Special instructions:   [facility]- Preparing For Surgery  Before surgery, you can play an important role. Because skin is not sterile, your skin needs to be as free of germs as possible. You can reduce the number of germs on your skin by washing with CHG (chlorahexidine gluconate) Soap before surgery.  CHG is an antiseptic cleaner which kills germs and bonds with the skin to continue killing germs even after washing.    Oral Hygiene is also important to reduce your risk of infection.  Remember - BRUSH YOUR TEETH THE MORNING OF SURGERY WITH YOUR REGULAR TOOTHPASTE  Please do not use if you have an allergy to CHG or antibacterial soaps. If your skin becomes reddened/irritated stop using the CHG.  Do not shave (including legs and underarms) for at least 48 hours prior to first CHG shower. It is OK to shave your face.  Please follow these instructions carefully.   1. Shower the NIGHT BEFORE SURGERY and the MORNING OF SURGERY with CHG.   2. If you chose to wash your hair, wash your hair first as usual with your normal shampoo.  3. After you shampoo, rinse your hair and body thoroughly to remove the shampoo.  4. Use CHG as you would any other liquid soap. You can apply CHG directly to the skin and wash gently with a scrungie or a clean washcloth.   5. Apply the CHG Soap to your body ONLY FROM THE NECK DOWN.  Do not use on open wounds or open sores. Avoid contact with your eyes, ears, mouth and genitals (private parts). Wash Face and genitals (private parts)  with your normal soap.  6. Wash thoroughly, paying special attention to the area where your surgery will be performed.  7. Thoroughly rinse your body with warm water from the neck down.  8. DO NOT shower/wash with your normal soap after using and rinsing off the CHG Soap.  9. Pat yourself dry with a CLEAN TOWEL.  10. Wear CLEAN PAJAMAS to bed the night before surgery, wear  comfortable clothes the morning of surgery  11. Place CLEAN SHEETS on your bed the night of your first shower and DO NOT SLEEP WITH PETS.    Day of Surgery:  Do not apply any deodorants/lotions.  Please wear clean clothes to the hospital/surgery center.   Remember to brush your teeth WITH YOUR REGULAR TOOTHPASTE.    Please read over the following fact sheets that you were given. Coughing and Deep Breathing and Surgical Site Infection  Prevention

## 2019-06-16 ENCOUNTER — Other Ambulatory Visit: Payer: Self-pay

## 2019-06-16 ENCOUNTER — Encounter (HOSPITAL_COMMUNITY): Payer: Self-pay

## 2019-06-16 ENCOUNTER — Encounter (HOSPITAL_COMMUNITY)
Admission: RE | Admit: 2019-06-16 | Discharge: 2019-06-16 | Disposition: A | Discharge status: Home / Self Care | Payer: BC Managed Care – PPO | Source: Ambulatory Visit | Attending: Surgery | Admitting: Surgery

## 2019-06-16 DIAGNOSIS — R928 Other abnormal and inconclusive findings on diagnostic imaging of breast: Secondary | ICD-10-CM | POA: Diagnosis not present

## 2019-06-16 DIAGNOSIS — Z01818 Encounter for other preprocedural examination: Secondary | ICD-10-CM | POA: Diagnosis not present

## 2019-06-16 DIAGNOSIS — D0512 Intraductal carcinoma in situ of left breast: Secondary | ICD-10-CM | POA: Diagnosis not present

## 2019-06-16 LAB — CBC
HCT: 40.6 % (ref 36.0–46.0)
Hemoglobin: 13.7 g/dL (ref 12.0–15.0)
MCH: 31.1 pg (ref 26.0–34.0)
MCHC: 33.7 g/dL (ref 30.0–36.0)
MCV: 92.1 fL (ref 80.0–100.0)
Platelets: 288 10*3/uL (ref 150–400)
RBC: 4.41 MIL/uL (ref 3.87–5.11)
RDW: 12.5 % (ref 11.5–15.5)
WBC: 6.2 10*3/uL (ref 4.0–10.5)
nRBC: 0 % (ref 0.0–0.2)

## 2019-06-16 LAB — BASIC METABOLIC PANEL
Anion gap: 5 (ref 5–15)
BUN: 18 mg/dL (ref 8–23)
CO2: 26 mmol/L (ref 22–32)
Calcium: 8.9 mg/dL (ref 8.9–10.3)
Chloride: 104 mmol/L (ref 98–111)
Creatinine, Ser: 0.85 mg/dL (ref 0.44–1.00)
GFR calc Af Amer: >60 mL/min (ref >60)
GFR calc non Af Amer: >60 mL/min (ref >60)
Glucose, Bld: 177 mg/dL — ABNORMAL HIGH (ref 70–99)
Potassium: 4.3 mmol/L (ref 3.5–5.1)
Sodium: 135 mmol/L (ref 135–145)

## 2019-06-16 LAB — GLUCOSE, CAPILLARY: Glucose-Capillary: 200 mg/dL — ABNORMAL HIGH (ref 70–99)

## 2019-06-16 NOTE — Progress Notes (Signed)
PCP - Alma Friendly, NP @ [facility] in [place] Cardiologist - na  PPM/ICD - na Device Orders -  Rep Notified  Chest x-ray - na EKG - today Stress Test - >10 yrs ECHO - na Cardiac Cath - > 10 yrs.  Sleep Study - na CPAP -   Fasting Blood Sugar - 127 Checks Blood Sugar ___1__ times a day  Blood Thinner Instructions:na Aspirin Instructions:  ERAS Protcol -yes PRE-SURGERY Gatorade - 2 drinks night before and 1 day of surgery  COVID TEST- 06-17-19   Anesthesia review:   Patient denies shortness of breath, fever, cough and chest pain at PAT appointment   Patient verbalized understanding of instructions that were given to them at the PAT appointment. Patient was also instructed that they will need to review over the PAT instructions again at home before surgery.

## 2019-06-17 ENCOUNTER — Other Ambulatory Visit (HOSPITAL_COMMUNITY)
Admission: RE | Admit: 2019-06-17 | Discharge: 2019-06-17 | Disposition: A | Discharge status: Home / Self Care | Payer: BC Managed Care – PPO | Source: Ambulatory Visit | Attending: Surgery | Admitting: Surgery

## 2019-06-17 DIAGNOSIS — Z01812 Encounter for preprocedural laboratory examination: Secondary | ICD-10-CM | POA: Diagnosis not present

## 2019-06-17 DIAGNOSIS — Z20828 Contact with and (suspected) exposure to other viral communicable diseases: Secondary | ICD-10-CM | POA: Diagnosis not present

## 2019-06-18 LAB — NOVEL CORONAVIRUS, NAA (HOSP ORDER, SEND-OUT TO REF LAB; TAT 18-24 HRS): SARS-CoV-2, NAA: NOT DETECTED

## 2019-06-20 NOTE — H&P (Signed)
Miranda Greene  Location: [facility] Patient #: [id number] DOB: 05-Jun-1956 Undefined / Language: Cleophus Molt / Race: White Female   History of Present Illness   The patient is a 63 year old female who presents with breast cancer. This is a pleasant 63 year old female referred by Alma Friendly NP after the recent diagnosis of DCIS of the left breast. Over the last several months, she has been having increasing firmness develop in the left breast and retraction of the left nipple. She underwent screening mammography which showed multiple calcifications and abnormalities in the left breast as well as the right breast. She had 2 areas biopsied in the left breast showing both to be consistent with DCIS. The right breast showed fibrocystic changes but this was felt to be discordant. She has since had an MRI of her breast. The total area of concern in the left breast measures greater than 7 cm and involved both the upper outer and lower outer quadrant of the left breast. There is a separate area in the left breast at the 8:30 position which may need a biopsy as well. The right breast showed a hematoma at the biopsy site and changes measuring 3 cm. Surgical excision of this area is recommended. I had a long discussion with the patient. She had a previous breast lumpectomy at [facility] around 2008. This was benign. There is no family history of breast cancer. She denies nipple discharge. She is otherwise without complaints. She does have a small sebaceous cyst in her right axilla for ultrasound.   Past Surgical History ) Breast Biopsy  Bilateral. multiple Hysterectomy (not due to cancer) - Partial  Oral Surgery   Diagnostic Studies History Colonoscopy  1-5 years ago Mammogram  within last year Pap Smear  >5 years ago  Allergies  Meloxicam *ANALGESICS - ANTI-INFLAMMATORY*  Allergies Reconciled   Medication History  metFORMIN HCl (1000MG  Tablet, Oral) Active. Basaglar  KwikPen (100UNIT/ML Soln Pen-inj, Subcutaneous) Active. Medications Reconciled  Social History  Caffeine use  Coffee. No alcohol use  No drug use  Tobacco use  Never smoker.  Family History  Arthritis  Mother. Cerebrovascular Accident  Mother. Diabetes Mellitus  Mother. Respiratory Condition  Father.  Pregnancy / Birth History Age at menarche  88 years. Age of menopause  30-50 Contraceptive History  Oral contraceptives. Gravida  4 Irregular periods  Length (months) of breastfeeding  3-6 Maternal age  49-20 Para  4  Other Problems  Breast Cancer  Diabetes Mellitus  Lump In Breast  Other disease, cancer, significant illness     Review of Systems  General Not Present- Appetite Loss, Chills, Fatigue, Fever, Night Sweats, Weight Gain and Weight Loss. Skin Not Present- Change in Wart/Mole, Dryness, Hives, Jaundice, New Lesions, Non-Healing Wounds, Rash and Ulcer. HEENT Present- Wears glasses/contact lenses. Not Present- Earache, Hearing Loss, Hoarseness, Nose Bleed, Oral Ulcers, Ringing in the Ears, Seasonal Allergies, Sinus Pain, Sore Throat, Visual Disturbances and Yellow Eyes. Respiratory Not Present- Bloody sputum, Chronic Cough, Difficulty Breathing, Snoring and Wheezing. Breast Present- Breast Mass and Breast Pain. Not Present- Nipple Discharge and Skin Changes. Cardiovascular Not Present- Chest Pain, Difficulty Breathing Lying Down, Leg Cramps, Palpitations, Rapid Heart Rate, Shortness of Breath and Swelling of Extremities. Gastrointestinal Not Present- Abdominal Pain, Bloating, Bloody Stool, Change in Bowel Habits, Chronic diarrhea, Constipation, Difficulty Swallowing, Excessive gas, Gets full quickly at meals, Hemorrhoids, Indigestion, Nausea, Rectal Pain and Vomiting. Female Genitourinary Not Present- Frequency, Nocturia, Painful Urination, Pelvic Pain and Urgency. Musculoskeletal Not Present-  Back Pain, Joint Pain, Joint Stiffness, Muscle Pain,  Muscle Weakness and Swelling of Extremities. Neurological Not Present- Decreased Memory, Fainting, Headaches, Numbness, Seizures, Tingling, Tremor, Trouble walking and Weakness. Psychiatric Not Present- Anxiety, Bipolar, Change in Sleep Pattern, Depression, Fearful and Frequent crying. Endocrine Not Present- Cold Intolerance, Excessive Hunger, Hair Changes, Heat Intolerance, Hot flashes and New Diabetes. Hematology Not Present- Blood Thinners, Easy Bruising, Excessive bleeding, Gland problems, HIV and Persistent Infections.  Vitals  Weight: 141.6 lb Height: 63in Body Surface Area: 1.67 m Body Mass Index: 25.08 kg/m  Temp.: 97.82F  Pulse: 92 (Regular)  BP: 128/88(Sitting, Left Arm, Standard)   Physical Exam  General Mental Status-Alert. General Appearance-Consistent with stated age. Hydration-Well hydrated. Voice-Normal.  Head and Neck Head-normocephalic, atraumatic with no lesions or palpable masses. Trachea-midline. Thyroid Gland Characteristics - normal size and consistency.  Eye Eyeball - Bilateral-Extraocular movements intact. Sclera/Conjunctiva - Bilateral-No scleral icterus.  Chest and Lung Exam Chest and lung exam reveals -quiet, even and easy respiratory effort with no use of accessory muscles and on auscultation, normal breath sounds, no adventitious sounds and normal vocal resonance. Inspection Chest Wall - Normal. Back - normal.  Breast Breast - Left-Asymmetric and Biopsy scar - Left, Non Tender, No Dimpling - Left, No Inflammation, No Lumpectomy scars, No Mastectomy scars, No Peau d' Orange. Note: There is retraction of the left nipple. There is firmness from there to the upper outer quadrant of the breast. Breast - Right-Symmetric and Biopsy scar - Right, Non Tender, No Dimpling - Right, No Inflammation, No Lumpectomy scars, No Mastectomy scars, No Peau d' Orange. Note: I cannot feel a specific mass in the right breast. There is  a superficial sebaceous cyst in the right axilla less than a centimeter in size.  Cardiovascular Cardiovascular examination reveals -normal heart sounds, regular rate and rhythm with no murmurs and normal pedal pulses bilaterally.  Abdomen - Did not examine.  Neurologic Neurologic evaluation reveals -alert and oriented x 3 with no impairment of recent or remote memory. Mental Status-Normal.  Musculoskeletal Normal Exam - Left-Upper Extremity Strength Normal and Lower Extremity Strength Normal. Normal Exam - Right-Upper Extremity Strength Normal and Lower Extremity Strength Normal.  Lymphatic Head & Neck  General Head & Neck Lymphatics: Bilateral - Description - Normal. Axillary  General Axillary Region: Bilateral - Description - Normal. Tenderness - Non Tender. Femoral & Inguinal - Did not examine.    Assessment & Plan  BREAST NEOPLASM, TIS (DCIS), LEFT (D05.12)  Impression: I had a long discussion with the patient. We've reviewed her biopsy results. I gave her a copy of the biopsy results showing the 2 areas of ductal carcinoma in situ which are ER and PR negative. We reviewed her mammograms and MRI. She has been researching this herself as well. We had a long discussion regarding breast cancer. Again, she currently does not have invasive cancer. Her issue is the size of the area of DCIS of left breast. She would not be a candidate for a lumpectomy given the size and the fact this is involving multiple quadrants. She would require a left breast mastectomy. I would also recommend a left axillary sentinel lymph node biopsy as the lymphatic pattern would be disrupted in case invasive cancer is found on the final pathology results. We discussed a right breast radioactive seed guided lumpectomy to excise this area to look for malignancy as well as it is discordant. She has already decided on mastectomy on the left side and we'll be considering this on  the right side as well. We  will go ahead and refer her to both medical and radiation oncology for further discussion as well as plastic surgery to consider immediate reconstruction. Once she has seen a specialist, we will discuss plans further for her eventual surgery. She understands and agrees with our plan.  Addendum:  She now wishes to proceed with bilateral mastectomy and left sentinel node biopsy

## 2019-06-21 ENCOUNTER — Encounter (HOSPITAL_COMMUNITY): Payer: Self-pay | Admitting: *Deleted

## 2019-06-21 ENCOUNTER — Observation Stay (HOSPITAL_COMMUNITY)
Admission: RE | Admit: 2019-06-21 | Discharge: 2019-06-22 | Disposition: A | Discharge status: Home / Self Care | Payer: BC Managed Care – PPO | Attending: Plastic Surgery | Admitting: Plastic Surgery

## 2019-06-21 ENCOUNTER — Ambulatory Visit (HOSPITAL_COMMUNITY): Payer: BC Managed Care – PPO | Admitting: Certified Registered"

## 2019-06-21 ENCOUNTER — Ambulatory Visit (HOSPITAL_COMMUNITY)
Admission: RE | Admit: 2019-06-21 | Discharge: 2019-06-21 | Disposition: A | Discharge status: Home / Self Care | Payer: BC Managed Care – PPO | Source: Ambulatory Visit | Attending: Surgery | Admitting: Surgery

## 2019-06-21 ENCOUNTER — Encounter (HOSPITAL_COMMUNITY)
Admission: RE | Disposition: A | Discharge status: Home / Self Care | Payer: Self-pay | Source: Home / Self Care | Attending: Plastic Surgery

## 2019-06-21 DIAGNOSIS — M19072 Primary osteoarthritis, left ankle and foot: Secondary | ICD-10-CM | POA: Diagnosis not present

## 2019-06-21 DIAGNOSIS — K219 Gastro-esophageal reflux disease without esophagitis: Secondary | ICD-10-CM | POA: Insufficient documentation

## 2019-06-21 DIAGNOSIS — M19042 Primary osteoarthritis, left hand: Secondary | ICD-10-CM | POA: Insufficient documentation

## 2019-06-21 DIAGNOSIS — Z886 Allergy status to analgesic agent status: Secondary | ICD-10-CM | POA: Insufficient documentation

## 2019-06-21 DIAGNOSIS — D241 Benign neoplasm of right breast: Secondary | ICD-10-CM | POA: Diagnosis not present

## 2019-06-21 DIAGNOSIS — E119 Type 2 diabetes mellitus without complications: Secondary | ICD-10-CM | POA: Diagnosis not present

## 2019-06-21 DIAGNOSIS — M19071 Primary osteoarthritis, right ankle and foot: Secondary | ICD-10-CM | POA: Insufficient documentation

## 2019-06-21 DIAGNOSIS — E785 Hyperlipidemia, unspecified: Secondary | ICD-10-CM | POA: Insufficient documentation

## 2019-06-21 DIAGNOSIS — M329 Systemic lupus erythematosus, unspecified: Secondary | ICD-10-CM | POA: Diagnosis not present

## 2019-06-21 DIAGNOSIS — Z888 Allergy status to other drugs, medicaments and biological substances status: Secondary | ICD-10-CM | POA: Diagnosis not present

## 2019-06-21 DIAGNOSIS — Z79899 Other long term (current) drug therapy: Secondary | ICD-10-CM | POA: Diagnosis not present

## 2019-06-21 DIAGNOSIS — M19041 Primary osteoarthritis, right hand: Secondary | ICD-10-CM | POA: Insufficient documentation

## 2019-06-21 DIAGNOSIS — G8918 Other acute postprocedural pain: Secondary | ICD-10-CM | POA: Diagnosis not present

## 2019-06-21 DIAGNOSIS — R921 Mammographic calcification found on diagnostic imaging of breast: Secondary | ICD-10-CM | POA: Diagnosis not present

## 2019-06-21 DIAGNOSIS — Z171 Estrogen receptor negative status [ER-]: Secondary | ICD-10-CM | POA: Diagnosis not present

## 2019-06-21 DIAGNOSIS — C50412 Malignant neoplasm of upper-outer quadrant of left female breast: Secondary | ICD-10-CM | POA: Diagnosis not present

## 2019-06-21 DIAGNOSIS — C50912 Malignant neoplasm of unspecified site of left female breast: Secondary | ICD-10-CM | POA: Diagnosis not present

## 2019-06-21 DIAGNOSIS — C50919 Malignant neoplasm of unspecified site of unspecified female breast: Secondary | ICD-10-CM | POA: Diagnosis present

## 2019-06-21 DIAGNOSIS — D0512 Intraductal carcinoma in situ of left breast: Principal | ICD-10-CM | POA: Insufficient documentation

## 2019-06-21 DIAGNOSIS — R928 Other abnormal and inconclusive findings on diagnostic imaging of breast: Secondary | ICD-10-CM | POA: Diagnosis not present

## 2019-06-21 DIAGNOSIS — Z794 Long term (current) use of insulin: Secondary | ICD-10-CM | POA: Insufficient documentation

## 2019-06-21 HISTORY — PX: BREAST RECONSTRUCTION WITH PLACEMENT OF TISSUE EXPANDER AND FLEX HD (ACELLULAR HYDRATED DERMIS): SHX6295

## 2019-06-21 HISTORY — PX: SENTINEL NODE BIOPSY: SHX6608

## 2019-06-21 HISTORY — DX: Malignant (primary) neoplasm, unspecified: C80.1

## 2019-06-21 HISTORY — PX: MASTECTOMY: SHX3

## 2019-06-21 HISTORY — PX: MASTECTOMY W/ SENTINEL NODE BIOPSY: SHX2001

## 2019-06-21 LAB — GLUCOSE, CAPILLARY
Glucose-Capillary: 155 mg/dL — ABNORMAL HIGH (ref 70–99)
Glucose-Capillary: 84 mg/dL (ref 70–99)
Glucose-Capillary: 165 mg/dL — ABNORMAL HIGH (ref 70–99)
Glucose-Capillary: 150 mg/dL — ABNORMAL HIGH (ref 70–99)

## 2019-06-21 SURGERY — MASTECTOMY WITH SENTINEL LYMPH NODE BIOPSY
Anesthesia: General | Site: Breast | Laterality: Left

## 2019-06-21 MED ORDER — PROPOFOL 10 MG/ML IV BOLUS
INTRAVENOUS | Status: AC
  Filled 2019-06-21: qty 20

## 2019-06-21 MED ORDER — SODIUM CHLORIDE 0.9 % IV SOLN
INTRAVENOUS | Status: AC
  Filled 2019-06-21: qty 500000

## 2019-06-21 MED ORDER — HYDROMORPHONE HCL 1 MG/ML IJ SOLN: 1.0000 mg | INTRAMUSCULAR | Status: DC | PRN

## 2019-06-21 MED ORDER — HYDROCODONE-ACETAMINOPHEN 5-325 MG PO TABS
1.0000 | ORAL_TABLET | ORAL | Status: DC | PRN
  Administered 2019-06-21 – 2019-06-22 (×3): 2 via ORAL
  Filled 2019-06-21 (×3): qty 2

## 2019-06-21 MED ORDER — LIDOCAINE 2% (20 MG/ML) 5 ML SYRINGE
INTRAMUSCULAR | Status: AC
  Filled 2019-06-21: qty 5

## 2019-06-21 MED ORDER — PROPOFOL 500 MG/50ML IV EMUL
INTRAVENOUS | Status: DC | PRN
  Administered 2019-06-21: 50 ug/kg/min via INTRAVENOUS

## 2019-06-21 MED ORDER — METHYLENE BLUE 0.5 % INJ SOLN
INTRAVENOUS | Status: AC
  Filled 2019-06-21: qty 10

## 2019-06-21 MED ORDER — PROMETHAZINE HCL 25 MG/ML IJ SOLN: 6.2500 mg | INTRAMUSCULAR | Status: DC | PRN

## 2019-06-21 MED ORDER — MEPERIDINE HCL 25 MG/ML IJ SOLN: 6.2500 mg | INTRAMUSCULAR | Status: DC | PRN

## 2019-06-21 MED ORDER — CEFAZOLIN SODIUM-DEXTROSE 2-4 GM/100ML-% IV SOLN: 2.0000 g | INTRAVENOUS | Status: DC

## 2019-06-21 MED ORDER — SODIUM CHLORIDE 0.9 % IV SOLN
INTRAVENOUS | Status: DC | PRN
  Administered 2019-06-21: 20 ug/min via INTRAVENOUS

## 2019-06-21 MED ORDER — ROCURONIUM BROMIDE 10 MG/ML (PF) SYRINGE
PREFILLED_SYRINGE | INTRAVENOUS | Status: AC
  Filled 2019-06-21: qty 10

## 2019-06-21 MED ORDER — DIAZEPAM 2 MG PO TABS: 2.0000 mg | ORAL_TABLET | Freq: Two times a day (BID) | ORAL | Status: DC | PRN

## 2019-06-21 MED ORDER — MIDAZOLAM HCL 2 MG/2ML IJ SOLN
INTRAMUSCULAR | Status: AC
  Administered 2019-06-21: 13:00:00 2 mg via INTRAVENOUS
  Filled 2019-06-21: qty 2

## 2019-06-21 MED ORDER — SODIUM CHLORIDE (PF) 0.9 % IJ SOLN
INTRAMUSCULAR | Status: AC
  Filled 2019-06-21: qty 10

## 2019-06-21 MED ORDER — FENTANYL CITRATE (PF) 100 MCG/2ML IJ SOLN
100.0000 ug | Freq: Once | INTRAMUSCULAR | Status: AC
  Administered 2019-06-21: 13:00:00 100 ug via INTRAVENOUS

## 2019-06-21 MED ORDER — DEXAMETHASONE SODIUM PHOSPHATE 10 MG/ML IJ SOLN
INTRAMUSCULAR | Status: DC | PRN
  Administered 2019-06-21: 5 mg via INTRAVENOUS

## 2019-06-21 MED ORDER — ACETAMINOPHEN 500 MG PO TABS: 1000.0000 mg | ORAL_TABLET | ORAL | Status: DC

## 2019-06-21 MED ORDER — CHLORHEXIDINE GLUCONATE CLOTH 2 % EX PADS: 6.0000 | MEDICATED_PAD | Freq: Once | CUTANEOUS | Status: DC

## 2019-06-21 MED ORDER — PROPOFOL 10 MG/ML IV BOLUS
INTRAVENOUS | Status: DC | PRN
  Administered 2019-06-21: 120 mg via INTRAVENOUS

## 2019-06-21 MED ORDER — PROPOFOL 1000 MG/100ML IV EMUL
INTRAVENOUS | Status: AC
  Filled 2019-06-21: qty 100

## 2019-06-21 MED ORDER — POLYETHYLENE GLYCOL 3350 17 G PO PACK: 17.0000 g | PACK | Freq: Every day | ORAL | Status: DC | PRN

## 2019-06-21 MED ORDER — ACETAMINOPHEN 500 MG PO TABS
1000.0000 mg | ORAL_TABLET | ORAL | Status: AC
  Administered 2019-06-21: 1000 mg via ORAL
  Filled 2019-06-21: qty 2

## 2019-06-21 MED ORDER — FENTANYL CITRATE (PF) 250 MCG/5ML IJ SOLN
INTRAMUSCULAR | Status: AC
  Filled 2019-06-21: qty 5

## 2019-06-21 MED ORDER — DIPHENHYDRAMINE HCL 50 MG/ML IJ SOLN: 12.5000 mg | Freq: Four times a day (QID) | INTRAMUSCULAR | Status: DC | PRN

## 2019-06-21 MED ORDER — ONDANSETRON HCL 4 MG/2ML IJ SOLN
INTRAMUSCULAR | Status: DC | PRN
  Administered 2019-06-21: 4 mg via INTRAVENOUS

## 2019-06-21 MED ORDER — ONDANSETRON HCL 4 MG/2ML IJ SOLN: 4.0000 mg | Freq: Once | INTRAMUSCULAR | Status: DC | PRN

## 2019-06-21 MED ORDER — LACTATED RINGERS IV SOLN
INTRAVENOUS | Status: DC
  Administered 2019-06-21: 10:00:00 via INTRAVENOUS

## 2019-06-21 MED ORDER — MIDAZOLAM HCL 2 MG/2ML IJ SOLN
INTRAMUSCULAR | Status: AC
  Filled 2019-06-21: qty 2

## 2019-06-21 MED ORDER — ACETAMINOPHEN 650 MG RE SUPP: 650.0000 mg | Freq: Four times a day (QID) | RECTAL | Status: DC | PRN

## 2019-06-21 MED ORDER — SUGAMMADEX SODIUM 200 MG/2ML IV SOLN
INTRAVENOUS | Status: DC | PRN
  Administered 2019-06-21: 127 mg via INTRAVENOUS

## 2019-06-21 MED ORDER — MIDAZOLAM HCL 2 MG/2ML IJ SOLN
2.0000 mg | Freq: Once | INTRAMUSCULAR | Status: AC
  Administered 2019-06-21: 13:00:00 2 mg via INTRAVENOUS

## 2019-06-21 MED ORDER — ENSURE PRE-SURGERY PO LIQD: 592.0000 mL | Freq: Once | ORAL | Status: DC

## 2019-06-21 MED ORDER — SODIUM CHLORIDE 0.9 % IV SOLN
INTRAVENOUS | Status: DC | PRN
  Administered 2019-06-21: 500 mL

## 2019-06-21 MED ORDER — FENTANYL CITRATE (PF) 100 MCG/2ML IJ SOLN
INTRAMUSCULAR | Status: AC
  Administered 2019-06-21: 100 ug via INTRAVENOUS
  Filled 2019-06-21: qty 2

## 2019-06-21 MED ORDER — DIPHENHYDRAMINE HCL 12.5 MG/5ML PO ELIX: 12.5000 mg | ORAL_SOLUTION | Freq: Four times a day (QID) | ORAL | Status: DC | PRN

## 2019-06-21 MED ORDER — LIDOCAINE-EPINEPHRINE (PF) 1.5 %-1:200000 IJ SOLN
INTRAMUSCULAR | Status: DC | PRN
  Administered 2019-06-21: 30 mL via PERINEURAL

## 2019-06-21 MED ORDER — HYDROMORPHONE HCL 1 MG/ML IJ SOLN
INTRAMUSCULAR | Status: AC
  Filled 2019-06-21: qty 1

## 2019-06-21 MED ORDER — HYDROMORPHONE HCL 1 MG/ML IJ SOLN: 0.2500 mg | INTRAMUSCULAR | Status: DC | PRN

## 2019-06-21 MED ORDER — ONDANSETRON HCL 4 MG/2ML IJ SOLN
INTRAMUSCULAR | Status: AC
  Filled 2019-06-21: qty 2

## 2019-06-21 MED ORDER — ROCURONIUM BROMIDE 10 MG/ML (PF) SYRINGE
PREFILLED_SYRINGE | INTRAVENOUS | Status: DC | PRN
  Administered 2019-06-21: 70 mg via INTRAVENOUS

## 2019-06-21 MED ORDER — CEFAZOLIN SODIUM-DEXTROSE 2-4 GM/100ML-% IV SOLN
2.0000 g | INTRAVENOUS | Status: AC
  Administered 2019-06-21: 2 g via INTRAVENOUS
  Filled 2019-06-21: qty 100

## 2019-06-21 MED ORDER — ENSURE PRE-SURGERY PO LIQD: 296.0000 mL | Freq: Once | ORAL | Status: DC

## 2019-06-21 MED ORDER — SENNA 8.6 MG PO TABS
1.0000 | ORAL_TABLET | Freq: Two times a day (BID) | ORAL | Status: DC
  Administered 2019-06-22: 8.6 mg via ORAL
  Filled 2019-06-21: qty 1

## 2019-06-21 MED ORDER — BUPIVACAINE-EPINEPHRINE (PF) 0.5% -1:200000 IJ SOLN
INTRAMUSCULAR | Status: DC | PRN
  Administered 2019-06-21: 30 mL

## 2019-06-21 MED ORDER — LACTATED RINGERS IV SOLN
INTRAVENOUS | Status: DC | PRN
  Administered 2019-06-21 (×2): via INTRAVENOUS

## 2019-06-21 MED ORDER — ONDANSETRON 4 MG PO TBDP: 4.0000 mg | ORAL_TABLET | Freq: Four times a day (QID) | ORAL | Status: DC | PRN

## 2019-06-21 MED ORDER — LIDOCAINE 2% (20 MG/ML) 5 ML SYRINGE
INTRAMUSCULAR | Status: DC | PRN
  Administered 2019-06-21: 100 mg via INTRAVENOUS

## 2019-06-21 MED ORDER — HYDROMORPHONE HCL 1 MG/ML IJ SOLN
0.2500 mg | INTRAMUSCULAR | Status: DC | PRN
  Administered 2019-06-21: 0.5 mg via INTRAVENOUS

## 2019-06-21 MED ORDER — 0.9 % SODIUM CHLORIDE (POUR BTL) OPTIME
TOPICAL | Status: DC | PRN
  Administered 2019-06-21: 2000 mL

## 2019-06-21 MED ORDER — ACETAMINOPHEN 325 MG PO TABS: 650.0000 mg | ORAL_TABLET | Freq: Four times a day (QID) | ORAL | Status: DC | PRN

## 2019-06-21 MED ORDER — FENTANYL CITRATE (PF) 250 MCG/5ML IJ SOLN
INTRAMUSCULAR | Status: DC | PRN
  Administered 2019-06-21: 50 ug via INTRAVENOUS
  Administered 2019-06-21: 100 ug via INTRAVENOUS

## 2019-06-21 MED ORDER — TECHNETIUM TC 99M SULFUR COLLOID FILTERED
1.0000 | Freq: Once | INTRAVENOUS | Status: AC | PRN
  Administered 2019-06-21: 1 via INTRADERMAL

## 2019-06-21 MED ORDER — ONDANSETRON HCL 4 MG/2ML IJ SOLN: 4.0000 mg | Freq: Four times a day (QID) | INTRAMUSCULAR | Status: DC | PRN

## 2019-06-21 MED ORDER — GABAPENTIN 300 MG PO CAPS
300.0000 mg | ORAL_CAPSULE | ORAL | Status: AC
  Administered 2019-06-21: 300 mg via ORAL
  Filled 2019-06-21: qty 1

## 2019-06-21 SURGICAL SUPPLY — 69 items
APPLIER CLIP 9.375 MED OPEN (MISCELLANEOUS) ×3
BAG DECANTER FOR FLEXI CONT (MISCELLANEOUS) ×3 IMPLANT
BINDER BREAST LRG (GAUZE/BANDAGES/DRESSINGS) IMPLANT
BINDER BREAST XLRG (GAUZE/BANDAGES/DRESSINGS) ×1 IMPLANT
BIOPATCH RED 1 DISK 7.0 (GAUZE/BANDAGES/DRESSINGS) ×8 IMPLANT
CANISTER SUCT 3000ML PPV (MISCELLANEOUS) ×6 IMPLANT
CHLORAPREP W/TINT 26 (MISCELLANEOUS) ×6 IMPLANT
CLIP APPLIE 9.375 MED OPEN (MISCELLANEOUS) ×2 IMPLANT
CONT SPEC 4OZ CLIKSEAL STRL BL (MISCELLANEOUS) ×3 IMPLANT
COVER PROBE W GEL 5X96 (DRAPES) ×3 IMPLANT
COVER SURGICAL LIGHT HANDLE (MISCELLANEOUS) ×6 IMPLANT
COVER WAND RF STERILE (DRAPES) ×6 IMPLANT
DERMABOND ADVANCED (GAUZE/BANDAGES/DRESSINGS) ×2
DERMABOND ADVANCED .7 DNX12 (GAUZE/BANDAGES/DRESSINGS) ×4 IMPLANT
DRAIN CHANNEL 19F RND (DRAIN) ×6 IMPLANT
DRAPE HALF SHEET 40X57 (DRAPES) ×4 IMPLANT
DRAPE ORTHO SPLIT 77X108 STRL (DRAPES) ×2
DRAPE SURG 17X23 STRL (DRAPES) ×12 IMPLANT
DRAPE SURG ORHT 6 SPLT 77X108 (DRAPES) ×4 IMPLANT
DRAPE WARM FLUID 44X44 (DRAPES) ×3 IMPLANT
DRSG PAD ABDOMINAL 8X10 ST (GAUZE/BANDAGES/DRESSINGS) ×10 IMPLANT
DRSG TEGADERM 4X4.75 (GAUZE/BANDAGES/DRESSINGS) ×4 IMPLANT
ELECT BLADE 4.0 EZ CLEAN MEGAD (MISCELLANEOUS) ×3
ELECT REM PT RETURN 9FT ADLT (ELECTROSURGICAL) ×3
ELECTRODE BLDE 4.0 EZ CLN MEGD (MISCELLANEOUS) IMPLANT
ELECTRODE REM PT RTRN 9FT ADLT (ELECTROSURGICAL) ×4 IMPLANT
EVACUATOR SILICONE 100CC (DRAIN) ×6 IMPLANT
GAUZE SPONGE 4X4 12PLY STRL (GAUZE/BANDAGES/DRESSINGS) ×6 IMPLANT
GLOVE BIO SURGEON STRL SZ 6.5 (GLOVE) ×6 IMPLANT
GLOVE BIO SURGEON STRL SZ7 (GLOVE) ×1 IMPLANT
GLOVE SURG SIGNA 7.5 PF LTX (GLOVE) ×3 IMPLANT
GOWN STRL REUS W/ TWL LRG LVL3 (GOWN DISPOSABLE) ×6 IMPLANT
GOWN STRL REUS W/ TWL XL LVL3 (GOWN DISPOSABLE) ×2 IMPLANT
GOWN STRL REUS W/TWL LRG LVL3 (GOWN DISPOSABLE) ×3
GOWN STRL REUS W/TWL XL LVL3 (GOWN DISPOSABLE) ×1
GRAFT FLEX HD 6X16 PLIABLE (Tissue) ×2 IMPLANT
IMPL EXPANDER BREAST 455CC (Breast) IMPLANT
IMPLANT BREAST 455CC (Breast) ×2 IMPLANT
IMPLANT EXPANDER BREAST 455CC (Breast) ×4 IMPLANT
KIT BASIN OR (CUSTOM PROCEDURE TRAY) ×6 IMPLANT
KIT TURNOVER KIT B (KITS) ×6 IMPLANT
NDL 18GX1X1/2 (RX/OR ONLY) (NEEDLE) IMPLANT
NDL FILTER BLUNT 18X1 1/2 (NEEDLE) IMPLANT
NDL HYPO 25GX1X1/2 BEV (NEEDLE) IMPLANT
NEEDLE 18GX1X1/2 (RX/OR ONLY) (NEEDLE) IMPLANT
NEEDLE FILTER BLUNT 18X 1/2SAF (NEEDLE)
NEEDLE FILTER BLUNT 18X1 1/2 (NEEDLE) IMPLANT
NEEDLE HYPO 25GX1X1/2 BEV (NEEDLE) IMPLANT
NS IRRIG 1000ML POUR BTL (IV SOLUTION) ×9 IMPLANT
PACK GENERAL/GYN (CUSTOM PROCEDURE TRAY) ×6 IMPLANT
PAD ARMBOARD 7.5X6 YLW CONV (MISCELLANEOUS) ×6 IMPLANT
PENCIL SMOKE EVACUATOR (MISCELLANEOUS) ×3 IMPLANT
PIN SAFETY STERILE (MISCELLANEOUS) ×3 IMPLANT
SET ASEPTIC TRANSFER (MISCELLANEOUS) ×1 IMPLANT
SPECIMEN JAR X LARGE (MISCELLANEOUS) ×3 IMPLANT
SPONGE LAP 18X18 RF (DISPOSABLE) ×1 IMPLANT
SUT ETHILON 2 0 FS 18 (SUTURE) ×2 IMPLANT
SUT MNCRL AB 4-0 PS2 18 (SUTURE) ×6 IMPLANT
SUT MON AB 3-0 SH 27 (SUTURE) ×2
SUT MON AB 3-0 SH27 (SUTURE) ×4 IMPLANT
SUT MON AB 5-0 PS2 18 (SUTURE) ×6 IMPLANT
SUT PDS AB 2-0 CT1 27 (SUTURE) ×15 IMPLANT
SUT SILK 2 0 SH (SUTURE) ×3 IMPLANT
SUT SILK 4 0 PS 2 (SUTURE) ×4 IMPLANT
SUT VIC AB 3-0 SH 18 (SUTURE) ×3 IMPLANT
SYR CONTROL 10ML LL (SYRINGE) IMPLANT
TOWEL GREEN STERILE (TOWEL DISPOSABLE) ×6 IMPLANT
TOWEL GREEN STERILE FF (TOWEL DISPOSABLE) ×6 IMPLANT
TRAY FOLEY MTR SLVR 14FR STAT (SET/KITS/TRAYS/PACK) IMPLANT

## 2019-06-21 NOTE — Discharge Instructions (Signed)
INSTRUCTIONS FOR AFTER BREAST SURGERY ° ° °You are getting ready to undergo breast surgery.  You will likely have some questions about what to expect following your operation.  The following information will help you and your family understand what to expect when you are discharged from the hospital.  Following these guidelines will help ensure a smooth recovery and reduce risks of complications.   °Postoperative instructions include information on: diet, wound care, medications and physical activity. ° °AFTER SURGERY °Expect to go home after the procedure.  In some cases, you may need to spend one night in the hospital for observation. ° °DIET °Breast surgery does not require a specific diet.  However, I have to mention that the healthier you eat the better your body can start healing. It is important to increasing your protein intake.  This means limiting the foods with sugar and carbohydrates.  Focus on vegetables and some meat.  If you have any liposuction during your procedure be sure to drink water.  If your urine is bright yellow, then it is concentrated, and you need to drink more water.  As a general rule after surgery, you should have 8 ounces of water every hour while awake.  If you find you are persistently nauseated or unable to take in liquids let us know.  NO TOBACCO USE or EXPOSURE.  This will slow your healing process and increase the risk of a wound. ° °WOUND CARE °You can shower five days after surgery.  Clean with baby wipes until the drain is removed.   °If you have steri-strips / tape directly attached to your skin leave them in place. It is OK to get these wet.  No baths, pools or hot tubs for two weeks. °We close your incision to leave the smallest and best-looking scar. No ointment or creams on your incisions until given the go ahead.  Especially not Neosporin (Too many skin reactions with this one).  A few weeks after surgery you can use Mederma and start massaging the scar. °We ask you to  wear your binder or sports bra for the first 6 weeks around the clock, including while sleeping. This provides added comfort and helps reduce the fluid accumulation at the surgery site. ° °ACTIVITY °No heavy lifting until cleared by the doctor.  This usually means no more than a half-gallon of milk.  It is OK to walk and climb stairs. In fact, moving your legs is very important to decrease your risk of a blood clot.  It will also help keep you from getting deconditioned.  Every 1 to 2 hours get up and walk for 5 minutes. This will help with a quicker recovery back to normal.  Let pain be your guide so you don't do too much.  NO, you cannot do the spring cleaning and don't plan on taking care of anyone else.  This is your time for TLC.  °You will be more comfortable if you sleep and rest with your head elevated either with a few pillows under you or in a recliner.  No stomach sleeping for a few months. ° °WORK °Everyone returns to work at different times. As a rough guide, most people take at least 1 - 2 weeks off prior to returning to work. If you need documentation for your job, bring the forms to your postoperative follow up visit. ° °DRIVING °Arrange for someone to bring you home from the hospital.  You may be able to drive a few days after   after surgery but not while taking any narcotics or valium.  BOWEL MOVEMENTS Constipation can occur after anesthesia and while taking pain medication.  It is important to stay ahead for your comfort.  We recommend taking Milk of Magnesia (2 tablespoons; twice a day) while taking the pain pills.  SEROMA This is fluid your body tried to put in the surgical site.  This is normal but if it creates tight skinny skin let us know.  It usually decreases in a few weeks.  WHEN TO CALL Call your surgeon's office if any of the following occur:  Fever 101 degrees F or greater  Excessive bleeding or fluid from the incision site.  Pain that increases over time without aid from  the medications  Redness, warmth, or pus draining from incision sites  Persistent nausea or inability to take in liquids  Severe misshapen area that underwent the operation.  Here are some resources:  1. Plastic surgery website: https://www.plasticsurgery.org/for-medical-professionals/education-and-resources/publications/breast-reconstruction-magazine 2. Breast Reconstruction Awareness Campaign:  [employer]nz 3. Plastic surgery Implant information:  https://www.plasticsurgery.org/patient-safety/breast-implant-safety

## 2019-06-21 NOTE — Op Note (Signed)
Op report    DATE OF OPERATION:  06/21/2019  LOCATION: [facility]  SURGICAL DIVISION: Plastic Surgery  PREOPERATIVE DIAGNOSES:  1. Left Breast cancer.    POSTOPERATIVE DIAGNOSES:  1. Left Breast cancer.   PROCEDURE:  1. Bilateral immediate breast reconstruction with placement of Acellular Dermal Matrix and tissue expanders.  SURGEON:  Sanger , DO  ASSISTANT: Roetta Sessions, PA  ANESTHESIA:  General.   COMPLICATIONS: None.   IMPLANTS: Left - Mentor 455 cc. Ref [id number].  Serial Number [id number], 200 cc of injectable salineplaced in the expander. Right - Mentor 455 cc. Ref [id number].  Serial Number [id number], 200 cc of injectable saline placed in the expander. Acellular Dermal Matrix - Flex HD 6 x 16 cm two  INDICATIONS FOR PROCEDURE:  The patient, Miranda Greene, is a 63 y.o. female born on 04-12-1956, is here for  immediate first stage breast reconstruction with placement of bilateral tissue expander and Acellular dermal matrix. MRN: [id number]  CONSENT:  Informed consent was obtained directly from the patient. Risks, benefits and alternatives were fully discussed. Specific risks including but not limited to bleeding, infection, hematoma, seroma, scarring, pain, implant infection, implant extrusion, capsular contracture, asymmetry, wound healing problems, and need for further surgery were all discussed. The patient did have an ample opportunity to have her questions answered to her satisfaction.   DESCRIPTION OF PROCEDURE:  The patient was taken to the operating room by the general surgery team. SCDs were placed and IV antibiotics were given. The patient's chest was prepped and draped in a sterile fashion. A time out was performed and the implants to be used were identified.  Bilateral mastectomies were performed.  Once the general surgery team had completed their portion of the case the patient was rendered to the plastic and  reconstructive surgery team.  Left:  The pectoralis major muscle was lifted from the chest wall with release of the lateral edge and lateral inframammary fold.  The pocket was irrigated with antibiotic solution and hemostasis was achieved with electrocautery.  The ADM was then prepared according to the manufacture guidelines and slits placed to help with postoperative fluid management.  The ADM was then sutured to the inferior and lateral edge of the inframammary fold with 2-0 PDS starting with an interrupted stitch and then a running stitch.  The lateral portion was sutured to with interrupted sutures after the expander was placed.  The expander was prepared according to the manufacture guidelines, the air evacuated and then it was placed under the ADM and pectoralis major muscle.  The inferior and lateral tabs were used to secure the expander to the chest wall with 2-0 PDS.  The drain was placed at the inframammary fold over the ADM and secured to the skin with 3-0 Silk.    The deep layers were closed with 3-0 Monocryl followed by 4-0 Monocryl.  The skin was closed with 5-0 Monocryl and then dermabond was applied.    Right: The pectoralis major muscle was lifted from the chest wall with release of the lateral edge and lateral inframammary fold.  The pocket was irrigated with antibiotic solution and hemostasis was achieved with electrocautery.  The ADM was then prepared according to the manufacture guidelines and slits placed to help with postoperative fluid management.  The ADM was then sutured to the inferior and lateral edge of the inframammary fold with 2-0 PDS starting with an interrupted stitch and then a running stitch.  The lateral portion was sutured to  with interrupted sutures after the expander was placed.  The expander was prepared according to the manufacture guidelines, the air evacuated and then it was placed under the ADM and pectoralis major muscle.  The inferior and lateral tabs were used to  secure the expander to the chest wall with 2-0 PDS.  The drain was placed at the inframammary fold over the ADM and secured to the skin with 3-0 Silk.  The deep layers were closed with 3-0 Monocryl followed by 4-0 Monocryl.  The skin was closed with 5-0 Monocryl and then dermabond was applied.   The ABDs and breast binder were placed.  The patient tolerated the procedure well and there were no complications.  The patient was allowed to wake from anesthesia and taken to the recovery room in satisfactory condition.   The advanced practice practitioner (APP) assisted throughout the case.  The APP was essential in retraction and counter traction when needed to make the case progress smoothly.  This retraction and assistance made it possible to see the tissue plans for the procedure.  The assistance was needed for blood control, tissue re-approximation and assisted with closure of the incision site.

## 2019-06-21 NOTE — Progress Notes (Signed)
Spouse notified of delay.

## 2019-06-21 NOTE — Anesthesia Postprocedure Evaluation (Signed)
Anesthesia Post Note  Patient: Miranda Greene  Procedure(s) Performed: BILATERAL MASTECTOMIES (Bilateral Breast) Left Sentinel Lymph Node Biopsy (Left Breast) BREAST RECONSTRUCTION WITH PLACEMENT OF TISSUE EXPANDER AND FLEX HD (ACELLULAR HYDRATED DERMIS) (Bilateral Breast)     Patient location during evaluation: PACU Anesthesia Type: General Level of consciousness: sedated and patient cooperative Pain management: pain level controlled Vital Signs Assessment: post-procedure vital signs reviewed and stable Respiratory status: spontaneous breathing Cardiovascular status: stable Anesthetic complications: no    Last Vitals:  Vitals:   06/21/19 1745 06/21/19 1801  BP:  140/79  Pulse: 69 70  Resp: 11 16  Temp: (!) 36 C 36.5 C  SpO2: 92% 95%    Last Pain:  Vitals:   06/21/19 1801  TempSrc: Oral  PainSc:                  Nolon Nations

## 2019-06-21 NOTE — Op Note (Addendum)
   Miranda Greene 06/21/2019   Pre-op Diagnosis: LEFT BREAST DCIS, RIGHT BREAST ABNORMAL MAMMOGRAM     Post-op Diagnosis: same  Procedure(s): BILATERAL MASTECTOMIES Left deep axillary Sentinel Lymph Node Biopsy  Surgeon: Dr. 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  She also has an abnormal area on mammogram of the right breast.  Biopsy was discordant. She was not a candidate for breast conservation regarding the left breast.  She has decided to proceed with bilateral mastectomies with immediate reconstruction  Procedure: The patient was brought to the operating room identifies correct patient.  She is placed upon the operating room table and general anesthesia was induced.  Her bilateral breast and chest were prepped and draped in the usual sterile fashion.  I performed an elliptical incision around the areola of the right breast transversely.  I then dissected down to the breast tissue with the cautery.  I then created superior and inferior skin flaps with electrocautery staying just underneath the dermis.  I took this up to the level of the clavicle and down to the inframammary ridge.  I also dissected toward the axilla.  Once the breast was free from the surrounding skin I then dissected off the chest wall moving medial to lateral with electrocautery removing it from the pectoralis fashion.  When the entire breast specimen was removed, I marked it laterally with a silk suture, and it was then sent to pathology.  Hemostasis appeared to be achieved.  I then left a surgical lap pad in the wound.  I then turned my attention towards left  breast.  I again performed an elliptical incision measuring the right side transversely around the nipple areolar complex with a scalpel.  I then dissected down to the breast tissue with electrocautery.  I again created superior and inferior skin flaps going up to the clavicle and down to the inframammary ridge staying underneath the dermis.  I then dissected medial to lateral toward the axilla.  Once the skin was freed circumferentially and flaps were created I then excised the breast off of the pectoralis fascia with electrocautery dissecting medial to lateral.  Several bridging vessels were controlled with the cautery.  Once I reached the axilla, the neoprobe was brought to the field.  Identified 1 enlarged lymph node in the axilla with a recent with increased uptake of radioactive isotope and remove this is the single sentinel lymph node.  No other enlarged lymph nodes were palpated and there was no further uptake of the radioactive isotope.  I then completed the mastectomy removing the rest of the breast tissue.  Again, I marked the breast with a single silk suture laterally.  At this point Dr. Marla Roe presented to the room to begin her part of the procedure and the immediate reconstruction.          Coralie Keens   Date: 06/21/2019  Time: 2:33 PM

## 2019-06-21 NOTE — Anesthesia Preprocedure Evaluation (Signed)
Anesthesia Evaluation  Patient identified by MRN, date of birth, ID band Patient awake    Reviewed: Allergy & Precautions, NPO status   Airway Mallampati: I  TM Distance: >3 FB Neck ROM: Full    Dental   Pulmonary    Pulmonary exam normal        Cardiovascular Normal cardiovascular exam     Neuro/Psych    GI/Hepatic GERD  Medicated and Controlled,  Endo/Other  diabetes, Type 2, Insulin Dependent  Renal/GU      Musculoskeletal   Abdominal   Peds  Hematology   Anesthesia Other Findings   Reproductive/Obstetrics                             Anesthesia Physical Anesthesia Plan  ASA: II  Anesthesia Plan: General   Post-op Pain Management:    Induction: Intravenous  PONV Risk Score and Plan: 3 and Ondansetron, Midazolam and Dexamethasone  Airway Management Planned: Oral ETT  Additional Equipment:   Intra-op Plan:   Post-operative Plan: Extubation in OR  Informed Consent: I have reviewed the patients History and Physical, chart, labs and discussed the procedure including the risks, benefits and alternatives for the proposed anesthesia with the patient or authorized representative who has indicated his/her understanding and acceptance.       Plan Discussed with: CRNA and Surgeon  Anesthesia Plan Comments:         Anesthesia Quick Evaluation

## 2019-06-21 NOTE — Interval H&P Note (Signed)
History and Physical Interval Note: no change in H and P  06/21/2019 10:46 AM  Miranda Greene  has presented today for surgery, with the diagnosis of LEFT BREAST DCIS, RIGHT BREAST ABNORMAL MAMMOGRAM.  The various methods of treatment have been discussed with the patient and family. After consideration of risks, benefits and other options for treatment, the patient has consented to  Procedure(s): BILATERAL MASTECTOMIES  WITH  LEFT SENTINEL LYMPH NODE BIOPSY (Bilateral) BREAST RECONSTRUCTION WITH PLACEMENT OF TISSUE EXPANDER AND FLEX HD (ACELLULAR HYDRATED DERMIS) (Bilateral) as a surgical intervention.  The patient's history has been reviewed, patient examined, no change in status, stable for surgery.  I have reviewed the patient's chart and labs.  Questions were answered to the patient's satisfaction.     Coralie Keens

## 2019-06-21 NOTE — Anesthesia Procedure Notes (Signed)
Procedure Name: Intubation Date/Time: 06/21/2019 1:30 PM Performed by: Amadeo Garnet, CRNA Pre-anesthesia Checklist: Patient identified, Emergency Drugs available, Suction available, Patient being monitored and Timeout performed Patient Re-evaluated:Patient Re-evaluated prior to induction Oxygen Delivery Method: Circle system utilized Preoxygenation: Pre-oxygenation with 100% oxygen Induction Type: IV induction Ventilation: Mask ventilation without difficulty Laryngoscope Size: Mac and 3 Grade View: Grade I Tube type: Oral Tube size: 7.0 mm Number of attempts: 1 Airway Equipment and Method: Stylet Placement Confirmation: ETT inserted through vocal cords under direct vision,  positive ETCO2 and breath sounds checked- equal and bilateral Secured at: 21 cm Tube secured with: Tape Dental Injury: Teeth and Oropharynx as per pre-operative assessment

## 2019-06-21 NOTE — Transfer of Care (Signed)
Immediate Anesthesia Transfer of Care Note  Patient: Miranda Greene  Procedure(s) Performed: BILATERAL MASTECTOMIES (Bilateral Breast) Left Sentinel Lymph Node Biopsy (Left Breast) BREAST RECONSTRUCTION WITH PLACEMENT OF TISSUE EXPANDER AND FLEX HD (ACELLULAR HYDRATED DERMIS) (Bilateral Breast)  Patient Location: PACU  Anesthesia Type:General  Level of Consciousness: drowsy and patient cooperative  Airway & Oxygen Therapy: Patient Spontanous Breathing and Patient connected to face mask oxygen  Post-op Assessment: Report given to RN and Post -op Vital signs reviewed and stable  Post vital signs: Reviewed and stable  Last Vitals:  Vitals Value Taken Time  BP 156/87 06/21/19 1621  Temp    Pulse    Resp 16 06/21/19 1623  SpO2    Vitals shown include unvalidated device data.  Last Pain:  Vitals:   06/21/19 1310  TempSrc:   PainSc: 0-No pain         Complications: No apparent anesthesia complications

## 2019-06-21 NOTE — Interval H&P Note (Signed)
History and Physical Interval Note:  06/21/2019 1:42 PM  Miranda Greene  has presented today for surgery, with the diagnosis of LEFT BREAST DCIS, RIGHT BREAST ABNORMAL MAMMOGRAM.  The various methods of treatment have been discussed with the patient and family. After consideration of risks, benefits and other options for treatment, the patient has consented to  Procedure(s): BILATERAL MASTECTOMIES  WITH  LEFT SENTINEL LYMPH NODE BIOPSY (Bilateral) BREAST RECONSTRUCTION WITH PLACEMENT OF TISSUE EXPANDER AND FLEX HD (ACELLULAR HYDRATED DERMIS) (Bilateral) as a surgical intervention.  The patient's history has been reviewed, patient examined, no change in status, stable for surgery.  I have reviewed the patient's chart and labs.  Questions were answered to the patient's satisfaction.     Loel Lofty Taylia Berber

## 2019-06-21 NOTE — Anesthesia Procedure Notes (Signed)
Anesthesia Regional Block: Pectoralis block   Pre-Anesthetic Checklist: ,, timeout performed, Correct Patient, Correct Site, Correct Laterality, Correct Procedure, Correct Position, site marked, Risks and benefits discussed,  Surgical consent,  Pre-op evaluation,  At surgeon's request and post-op pain management  Laterality: Left and Right  Prep: chloraprep       Needles:  Injection technique: Single-shot     Needle Length: 9cm  Needle Gauge: 21     Additional Needles:   Narrative:  Start time: 06/21/2019 12:30 PM End time: 06/21/2019 12:45 PM Injection made incrementally with aspirations every 5 mL.  Performed by: Personally  Anesthesiologist: Lillia Abed, MD  Additional Notes: Monitors applied. Patient sedated. Sterile prep and drape,hand hygiene and sterile gloves were used. Bilateral block performed.Relevant anatomy identified each side.Needle position confirmed.Local anesthetic injected incrementally after negative aspiration. Local anesthetic spread visualized. Vascular puncture avoided. No complications. Image printed for medical record.The patient tolerated the procedure well.

## 2019-06-22 ENCOUNTER — Encounter (HOSPITAL_COMMUNITY): Payer: Self-pay | Admitting: General Practice

## 2019-06-22 DIAGNOSIS — Z79899 Other long term (current) drug therapy: Secondary | ICD-10-CM | POA: Diagnosis not present

## 2019-06-22 DIAGNOSIS — M19041 Primary osteoarthritis, right hand: Secondary | ICD-10-CM | POA: Diagnosis not present

## 2019-06-22 DIAGNOSIS — K219 Gastro-esophageal reflux disease without esophagitis: Secondary | ICD-10-CM | POA: Diagnosis not present

## 2019-06-22 DIAGNOSIS — E785 Hyperlipidemia, unspecified: Secondary | ICD-10-CM | POA: Diagnosis not present

## 2019-06-22 DIAGNOSIS — D0512 Intraductal carcinoma in situ of left breast: Secondary | ICD-10-CM | POA: Diagnosis not present

## 2019-06-22 DIAGNOSIS — Z888 Allergy status to other drugs, medicaments and biological substances status: Secondary | ICD-10-CM | POA: Diagnosis not present

## 2019-06-22 DIAGNOSIS — M329 Systemic lupus erythematosus, unspecified: Secondary | ICD-10-CM | POA: Diagnosis not present

## 2019-06-22 DIAGNOSIS — M19071 Primary osteoarthritis, right ankle and foot: Secondary | ICD-10-CM | POA: Diagnosis not present

## 2019-06-22 DIAGNOSIS — M19072 Primary osteoarthritis, left ankle and foot: Secondary | ICD-10-CM | POA: Diagnosis not present

## 2019-06-22 DIAGNOSIS — Z794 Long term (current) use of insulin: Secondary | ICD-10-CM | POA: Diagnosis not present

## 2019-06-22 DIAGNOSIS — M19042 Primary osteoarthritis, left hand: Secondary | ICD-10-CM | POA: Diagnosis not present

## 2019-06-22 DIAGNOSIS — D241 Benign neoplasm of right breast: Secondary | ICD-10-CM | POA: Diagnosis not present

## 2019-06-22 DIAGNOSIS — Z886 Allergy status to analgesic agent status: Secondary | ICD-10-CM | POA: Diagnosis not present

## 2019-06-22 DIAGNOSIS — E119 Type 2 diabetes mellitus without complications: Secondary | ICD-10-CM | POA: Diagnosis not present

## 2019-06-22 LAB — BASIC METABOLIC PANEL
Anion gap: 10 (ref 5–15)
BUN: 10 mg/dL (ref 8–23)
CO2: 26 mmol/L (ref 22–32)
Calcium: 9 mg/dL (ref 8.9–10.3)
Chloride: 100 mmol/L (ref 98–111)
Creatinine, Ser: 0.76 mg/dL (ref 0.44–1.00)
GFR calc Af Amer: >60 mL/min (ref >60)
GFR calc non Af Amer: >60 mL/min (ref >60)
Glucose, Bld: 172 mg/dL — ABNORMAL HIGH (ref 70–99)
Potassium: 4.6 mmol/L (ref 3.5–5.1)
Sodium: 136 mmol/L (ref 135–145)

## 2019-06-22 LAB — CBC
HCT: 36 % (ref 36.0–46.0)
Hemoglobin: 11.9 g/dL — ABNORMAL LOW (ref 12.0–15.0)
MCH: 30.8 pg (ref 26.0–34.0)
MCHC: 33.1 g/dL (ref 30.0–36.0)
MCV: 93.3 fL (ref 80.0–100.0)
Platelets: 255 10*3/uL (ref 150–400)
RBC: 3.86 MIL/uL — ABNORMAL LOW (ref 3.87–5.11)
RDW: 12.7 % (ref 11.5–15.5)
WBC: 8.8 10*3/uL (ref 4.0–10.5)
nRBC: 0 % (ref 0.0–0.2)

## 2019-06-22 LAB — HIV ANTIBODY (ROUTINE TESTING W REFLEX): HIV Screen 4th Generation wRfx: NONREACTIVE

## 2019-06-22 NOTE — Plan of Care (Signed)
  Problem: Education: Goal: Required Educational Video(s) Outcome: Completed/Met   Problem: Clinical Measurements: Goal: Ability to maintain clinical measurements within normal limits will improve Outcome: Completed/Met Goal: Postoperative complications will be avoided or minimized Outcome: Completed/Met   Problem: Skin Integrity: Goal: Demonstration of wound healing without infection will improve Outcome: Completed/Met   Problem: Education: Goal: Knowledge of disease or condition will improve Outcome: Completed/Met   Problem: Activity: Goal: Ability to maintain or regain function will improve Outcome: Completed/Met   Problem: Clinical Measurements: Goal: Postoperative complications will be avoided or minimized Outcome: Completed/Met   Problem: Self-Concept: Goal: Ability to verbalize positive feelings about self will improve Outcome: Completed/Met   Problem: Pain Management: Goal: Expressions of feelings of enhanced comfort will increase Outcome: Completed/Met   Problem: Skin Integrity: Goal: Demonstration of wound healing without infection will improve Outcome: Completed/Met

## 2019-06-22 NOTE — Plan of Care (Signed)
  Problem: Clinical Measurements: Goal: Ability to maintain clinical measurements within normal limits will improve Outcome: Progressing Goal: Postoperative complications will be avoided or minimized Outcome: Progressing   

## 2019-06-22 NOTE — Progress Notes (Signed)
Miranda Greene to be D/C'd  per MD order. Discussed with the patient and all questions fully answered.  VSS, Skin clean, dry and intact without evidence of skin break down, no evidence of skin tears noted.  IV catheter discontinued intact. Site without signs and symptoms of complications. Dressing and pressure applied.  An After Visit Summary was printed and given to the patient. Patient received prescription.  D/c education completed with patient/family including follow up instructions, medication list, d/c activities limitations if indicated, with other d/c instructions as indicated by MD - patient able to verbalize understanding, all questions fully answered.   Patient instructed to return to ED, call 911, or call MD for any changes in condition.   Patient to be escorted via [place], and D/C home via private auto.

## 2019-06-22 NOTE — Discharge Summary (Signed)
Physician Discharge Summary  Patient ID: Miranda Greene MRN: [id number] DOB/AGE: 09-Apr-1956 63 y.o.  Admit date: 06/21/2019 Discharge date: 06/22/2019  Admission Diagnoses:LEFT BREAST DCIS, RIGHT BREAST ABNORMAL MAMMOGRAM  Discharge Diagnoses:  Active Problems:   Breast cancer [facility])   Discharged Condition: good  Hospital Course: Mrs. Reach presented to the [place] for bilateral mastectomy by Dr. Nathaneil Canary blackman followed by immediate breast reconstruction with placement of expanders and flex HD by Dr. Audelia Hives on 06/21/19.   This AM on evaluation she is doing well. She reports her pain is easily managed by oral pain medications. She has no complaints other than post-operative pain. No BM, but she reports she has not had anything to eat because she arrived to the floor late. She has + flatus and is urinating without pain.   JP drains in place with serosanguinous output. 130 cc total since surgical intervention yesterday ~ 2:30pm. Left draining > right.  She is ambulating to the bathroom without issues. Denies dizziness, headache, weakness, fatigue.   She reports she is ready for d/c and feels comfortable going home. No fever, chills, n/v overnight.  Consults: None  Significant Diagnostic Studies: labs: cbc, bmp.  Treatments: IV hydration, antibiotics: Ancef, analgesia: Vicodin and surgery: bilateral mastectomy followed by immediate reconstruction.  Discharge Exam: Blood pressure 131/69, pulse 76, temperature 98.3 F (36.8 C), temperature source Oral, resp. rate 18, height 5\' 3"  (1.6 m), weight 63.5 kg, SpO2 99 %.  Chaperone present General appearance: alert, cooperative, no distress and resting in bed Head: Normocephalic, without obvious abnormality, atraumatic Neck: no adenopathy and supple, symmetrical, trachea midline Resp: clear to auscultation bilaterally and without wheezing, rhonchi, distress Chest wall: bilateral tenderness to palpation Breasts:  post-operative mastectomy incisions with dermabond in place. JP drains in place with serosanguinous output. Incisions c/d/i. Slight irritation of peri-incision bilaterally. No drainage noted.  Cardio: regular rate and rhythm, S1, S2 normal, no murmur, click, rub or gallop Extremities: extremities normal, atraumatic, no cyanosis or edema Pulses: 2+ and symmetric Skin: Skin color, texture, turgor normal. No rashes or lesions Neurologic: Grossly normal Incision/Wound: c/d/i, no drainage, no dehiscence noted.  Disposition: Discharge disposition: 01-Home or Self Care       Discharge Instructions    Call MD for:  difficulty breathing, headache or visual disturbances   Complete by: As directed    Call MD for:  extreme fatigue   Complete by: As directed    Call MD for:  hives   Complete by: As directed    Call MD for:  persistant dizziness or light-headedness   Complete by: As directed    Call MD for:  persistant nausea and vomiting   Complete by: As directed    Call MD for:  redness, tenderness, or signs of infection (pain, swelling, redness, odor or green/yellow discharge around incision site)   Complete by: As directed    Call MD for:  severe uncontrolled pain   Complete by: As directed    Call MD for:  temperature >100.4   Complete by: As directed    Diet - low sodium heart healthy   Complete by: As directed    Increase activity slowly   Complete by: As directed      Allergies as of 06/22/2019      Reactions   Meloxicam Hives   Invokana [canagliflozin] Rash      Medication List    TAKE these medications   ibuprofen 200 MG tablet Commonly known as: ADVIL Take  200 mg by mouth every 6 (six) hours as needed for moderate pain.   insulin glargine 100 UNIT/ML injection Commonly known as: LANTUS Inject 20 Units into the skin at bedtime.   metFORMIN 1000 MG tablet Commonly known as: GLUCOPHAGE Take 1 tablet (1,000 mg total) by mouth 2 (two) times daily with a meal. For  diabetes.   mupirocin ointment 2 % Commonly known as: BACTROBAN Place 1 application into the nose at bedtime. Apply  around the left side mouth   nystatin cream Commonly known as: MYCOSTATIN Apply 1 application topically 2 (two) times daily. What changed:   when to take this  additional instructions   omeprazole 20 MG capsule Commonly known as: PRILOSEC Take 20 mg by mouth daily.   pravastatin 20 MG tablet Commonly known as: PRAVACHOL Take 1 tablet (20 mg total) by mouth daily. For cholesterol.      Follow-up Information    Dillingham, Loel Lofty, DO In 1 week.   Specialty: Plastic Surgery Contact information: [place] [address] [phone]          Return precautions provided. Call with any questions or concerns, follow up in clinic in 1 week.   Signed: Carola Rhine Bj Morlock 06/22/2019, 7:29 AM

## 2019-06-23 LAB — SURGICAL PATHOLOGY

## 2019-06-27 NOTE — Progress Notes (Signed)
Patient Care Team: Pleas Koch, NP as PCP - General (Nurse Practitioner) Mauro Kaufmann, RN as Oncology Nurse Navigator Rockwell Germany, RN as Oncology Nurse Navigator  DIAGNOSIS:    ICD-10-CM   1. Ductal carcinoma in situ (DCIS) of left breast  D05.12     SUMMARY OF ONCOLOGIC HISTORY: Oncology History  Ductal carcinoma in situ (DCIS) of left breast  04/28/2019 Initial Diagnosis   Patient palpated lumps in her left breast and right axilla, noted nipple retraction in her left breast. Mammogram and US showed two areas of distortion in the right breast and a cyst in the right axilla. In the left breast, a group of calcifications spanning 3.8cm in the UOQ and additionally in the medial left breast, a 1.6cm mass at the 12:00 location, a 0.9cm mass at the 8:30 location, no left axillary adenopathy. 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    04/30/2019 Cancer Staging   Staging form: Breast, AJCC 8th Edition - Clinical stage from 04/30/2019: Stage 0 (cTis (DCIS), cN0, cM0, ER-, PR-) - Signed by Gardenia Phlegm, NP on 05/12/2019   05/06/2019 Breast MRI   7.0cm biopsy proven DCIS extending from the left nipple to the UOQ and LOQ and a 1.0cm hematoma within a 3.9cm area of non mass enhancement.    06/21/2019 Surgery   Bilateral mastectomies Ninfa Linden, Dillingham) Right breast: benign  Left breast: DCIS, high grade, 7.0cm, clear margins, 1 left axillary lymph node. Negative.  ER 0%, PR 0%     CHIEF COMPLIANT: Follow-up s/p bilateral mastectomies to review pathology   INTERVAL HISTORY: Miranda Greene is a 63 y.o. with above-mentioned history of left breast DCIS. She underwent bilateral mastectomies with reconstruction on 06/21/19 with Dr. Ninfa Linden for which pathology showed benign right breast and in the left breast, DCIS, high grade, 7.0cm, clear margins, and no evidence of carcinoma in 1 left axillary lymph  node. She presents to the clinic today to review the pathology report and discuss further treatment.   REVIEW OF SYSTEMS:   Constitutional: Denies fevers, chills or abnormal weight loss Eyes: Denies blurriness of vision Ears, nose, mouth, throat, and face: Denies mucositis or sore throat Respiratory: Denies cough, dyspnea or wheezes Cardiovascular: Denies palpitation, chest discomfort Gastrointestinal: Denies nausea, heartburn or change in bowel habits Skin: Denies abnormal skin rashes Lymphatics: Denies new lymphadenopathy or easy bruising Neurological: Denies numbness, tingling or new weaknesses Behavioral/Psych: Mood is stable, no new changes  Extremities: No lower extremity edema Breast: Bilateral mastectomies with reconstruction, patient has a drain still in place. All other systems were reviewed with the patient and are negative.  I have reviewed the past medical history, past surgical history, social history and family history with the patient and they are unchanged from previous note.  ALLERGIES:  is allergic to meloxicam and invokana [canagliflozin].  MEDICATIONS:  Current Outpatient Medications  Medication Sig Dispense Refill  . ibuprofen (ADVIL,MOTRIN) 200 MG tablet Take 200 mg by mouth every 6 (six) hours as needed for moderate pain.     Marland Kitchen insulin glargine (LANTUS) 100 UNIT/ML injection Inject 20 Units into the skin at bedtime.     . metFORMIN (GLUCOPHAGE) 1000 MG tablet Take 1 tablet (1,000 mg total) by mouth 2 (two) times daily with a meal. For diabetes. 180 tablet 3  . mupirocin ointment (BACTROBAN) 2 % Place 1 application into the nose at bedtime. Apply  around the left  side mouth    . nystatin cream (MYCOSTATIN) Apply 1 application topically 2 (two) times daily. (Patient taking differently: Apply 1 application topically at bedtime. Apply to right side of mouth at bedtime) 30 g 0  . omeprazole (PRILOSEC) 20 MG capsule Take 20 mg by mouth daily.    . pravastatin  (PRAVACHOL) 20 MG tablet Take 1 tablet (20 mg total) by mouth daily. For cholesterol. 90 tablet 3   No current facility-administered medications for this visit.     PHYSICAL EXAMINATION: ECOG PERFORMANCE STATUS: 1 - Symptomatic but completely ambulatory  Vitals:   06/28/19 0943  BP: 116/66  Pulse: 81  Resp: 18  Temp: 98.2 F (36.8 C)  SpO2: 99%   Filed Weights   06/28/19 0943  Weight: 139 lb 9.6 oz (63.3 kg)    GENERAL: alert, no distress and comfortable SKIN: skin color, texture, turgor are normal, no rashes or significant lesions EYES: normal, Conjunctiva are pink and non-injected, sclera clear OROPHARYNX: no exudate, no erythema and lips, buccal mucosa, and tongue normal  NECK: supple, thyroid normal size, non-tender, without nodularity LYMPH: no palpable lymphadenopathy in the cervical, axillary or inguinal LUNGS: clear to auscultation and percussion with normal breathing effort HEART: regular rate & rhythm and no murmurs and no lower extremity edema ABDOMEN: abdomen soft, non-tender and normal bowel sounds MUSCULOSKELETAL: no cyanosis of digits and no clubbing  NEURO: alert & oriented x 3 with fluent speech, no focal motor/sensory deficits EXTREMITIES: No lower extremity edema  LABORATORY DATA:  I have reviewed the data as listed CMP Latest Ref Rng & Units 06/22/2019 06/16/2019 05/20/2019  Glucose 70 - 99 mg/dL 172(H) 177(H) -  BUN 8 - 23 mg/dL 10 18 -  Creatinine 0.44 - 1.00 mg/dL 0.76 0.85 -  Sodium 135 - 145 mmol/L 136 135 -  Potassium 3.5 - 5.1 mmol/L 4.6 4.3 -  Chloride 98 - 111 mmol/L 100 104 -  CO2 22 - 32 mmol/L 26 26 -  Calcium 8.9 - 10.3 mg/dL 9.0 8.9 -  Total Protein 6.0 - 8.3 g/dL - - 7.1  Total Bilirubin 0.2 - 1.2 mg/dL - - 0.5  Alkaline Phos 39 - 117 U/L - - 64  AST 0 - 37 U/L - - 14  ALT 0 - 35 U/L - - 10    Lab Results  Component Value Date   WBC 8.8 06/22/2019   HGB 11.9 (L) 06/22/2019   HCT 36.0 06/22/2019   MCV 93.3 06/22/2019   PLT 255  06/22/2019    ASSESSMENT & PLAN:  Ductal carcinoma in situ (DCIS) of left breast Bilateral mastectomies Ninfa Linden, Dillingham) Right breast: benign  Left breast: DCIS, high grade, 7.0cm, clear margins, 1 left axillary lymph node. Negative.  Staging: Tis NX stage 0 ER 0%, PR 0%  Pathology counseling: I discussed the final pathology report of the patient provided  a copy of this report. I discussed the margins as well as lymph node surgeries. We also discussed the final staging along with previously performed ER/PR   testing.  Treatment plan: No further treatment is necessary. Patient be followed by surgery for her annual checkups. We will see her on an as-needed basis.   No orders of the defined types were placed in this encounter.  The patient has a good understanding of the overall plan. she agrees with it. she will call with any problems that may develop before the next visit here.  Nicholas Lose, MD 06/28/2019  Julious Oka Dorshimer am  acting as scribe for Dr. Nicholas Lose.  I have reviewed the above documentation for accuracy and completeness, and I agree with the above.

## 2019-06-28 ENCOUNTER — Encounter: Payer: Self-pay | Admitting: *Deleted

## 2019-06-28 ENCOUNTER — Other Ambulatory Visit: Payer: Self-pay

## 2019-06-28 ENCOUNTER — Inpatient Hospital Stay: Payer: BC Managed Care – PPO | Attending: Hematology and Oncology | Admitting: Hematology and Oncology

## 2019-06-28 DIAGNOSIS — Z794 Long term (current) use of insulin: Secondary | ICD-10-CM | POA: Diagnosis not present

## 2019-06-28 DIAGNOSIS — Z79899 Other long term (current) drug therapy: Secondary | ICD-10-CM | POA: Diagnosis not present

## 2019-06-28 DIAGNOSIS — Z9013 Acquired absence of bilateral breasts and nipples: Secondary | ICD-10-CM | POA: Insufficient documentation

## 2019-06-28 DIAGNOSIS — Z791 Long term (current) use of non-steroidal anti-inflammatories (NSAID): Secondary | ICD-10-CM | POA: Diagnosis not present

## 2019-06-28 DIAGNOSIS — D0512 Intraductal carcinoma in situ of left breast: Secondary | ICD-10-CM | POA: Diagnosis not present

## 2019-06-28 NOTE — Assessment & Plan Note (Signed)
Bilateral mastectomies Ninfa Linden, Dillingham) Right breast: benign  Left breast: DCIS, high grade, 7.0cm, clear margins, 1 left axillary lymph node. Negative.  Staging: Tis NX stage 0 ER 0%, PR 0%  Pathology counseling: I discussed the final pathology report of the patient provided  a copy of this report. I discussed the margins as well as lymph node surgeries. We also discussed the final staging along with previously performed ER/PR   testing.  Treatment plan: No further treatment is necessary. Patient be followed by surgery for her annual checkups. We will see her on an as-needed basis.

## 2019-06-29 ENCOUNTER — Encounter: Payer: BC Managed Care – PPO | Admitting: Plastic Surgery

## 2019-06-29 ENCOUNTER — Ambulatory Visit (INDEPENDENT_AMBULATORY_CARE_PROVIDER_SITE_OTHER): Payer: BC Managed Care – PPO | Admitting: Surgical

## 2019-06-29 ENCOUNTER — Encounter: Payer: Self-pay | Admitting: Surgical

## 2019-06-29 VITALS — BP 147/80 | HR 91 | Temp 97.7°F | Ht 63.0 in | Wt 139.6 lb

## 2019-06-29 DIAGNOSIS — Z171 Estrogen receptor negative status [ER-]: Secondary | ICD-10-CM

## 2019-06-29 DIAGNOSIS — C50412 Malignant neoplasm of upper-outer quadrant of left female breast: Secondary | ICD-10-CM

## 2019-06-29 NOTE — Progress Notes (Signed)
   Subjective:     Patient ID: Miranda Greene, female    DOB: Sep 24, 1955, 63 y.o.   MRN: [id number]  Chief Complaint  Patient presents with  . Post-op Follow-up    Immediate (B) breast reconstruction w/ expanders and flex HD    HPI: The patient is a 63 y.o. female here for follow-up after immediate bilateral breast reconstruction with placement of tissue expanders and flex HD on 06/21/19 by Dr. Marla Roe. Mastectomy performed by Dr. Ninfa Linden.  She is 1 week post-op. No further oncologic management necessary by oncology per their note.  JP drain output over last 48 hours < 20 cc total.   She is overall doing well, has some tenderness of her R > L breast. No fever, chills, n/v. Incisions are all c/d/i. Dermabond in place. No drainage noted. No sign of hematoma, seroma, infection.  Review of Systems  Constitutional: Positive for activity change. Negative for appetite change, chills, diaphoresis, fatigue and fever.  Respiratory: Negative for shortness of breath and wheezing.   Cardiovascular: Negative for chest pain, palpitations and leg swelling.  Gastrointestinal: Negative for abdominal pain, diarrhea, nausea and vomiting.  Musculoskeletal: Positive for myalgias (breast).  Skin: Negative for color change, pallor, rash and wound.  Neurological: Negative for dizziness and weakness.     Objective:   Vital Signs BP (!) 147/80 (BP Location: Left Arm, Patient Position: Sitting, Cuff Size: Normal)   Pulse 91   Temp 97.7 F (36.5 C) (Temporal)   Ht 5\' 3"  (1.6 m)   Wt 139 lb 9.6 oz (63.3 kg)   SpO2 98%   BMI 24.73 kg/m  Vital Signs and Nursing Note Reviewed Chaperone present Physical Exam  Constitutional: She is oriented to person, place, and time and well-developed, well-nourished, and in no distress.  HENT:  Head: Normocephalic and atraumatic.  Cardiovascular: Normal rate.  Pulmonary/Chest: Effort normal.    Musculoskeletal: Normal range of motion.  Neurological: She is  alert and oriented to person, place, and time. Gait normal.  Skin: Skin is warm and dry. No rash noted. She is not diaphoretic. No erythema. No pallor.  Psychiatric: Mood and affect normal.    Assessment/Plan:     ICD-10-CM   1. Malignant neoplasm of upper-outer quadrant of left breast in female, estrogen receptor negative ([place])  C50.412    Z17.1    Mrs. Divis is doing well. She has some post-op pain that is mostly in her right breast, but is otherwise doing well. JP drains in place, removed today bilaterally due to low output.   No complaints other than some mild pain.  We placed injectable saline in the Expander using a sterile technique: Right: 25 cc for a total of 225 / 455 cc Left: 25 cc for a total of 225 / 455 cc  Follow up in 2 weeks for additional fill.   Carola Rhine Cathleen Yagi, PA-C 06/29/2019, 1:29 PM

## 2019-07-07 ENCOUNTER — Encounter: Payer: Self-pay | Admitting: Plastic Surgery

## 2019-07-12 ENCOUNTER — Telehealth: Payer: Self-pay

## 2019-07-12 NOTE — Telephone Encounter (Signed)

## 2019-07-13 ENCOUNTER — Other Ambulatory Visit: Payer: Self-pay

## 2019-07-13 ENCOUNTER — Encounter: Payer: Self-pay | Admitting: Nurse Practitioner

## 2019-07-13 ENCOUNTER — Ambulatory Visit (INDEPENDENT_AMBULATORY_CARE_PROVIDER_SITE_OTHER): Payer: BC Managed Care – PPO | Admitting: Nurse Practitioner

## 2019-07-13 VITALS — BP 128/76 | HR 93 | Temp 96.6°F | Ht 63.0 in | Wt 138.2 lb

## 2019-07-13 DIAGNOSIS — Z9889 Other specified postprocedural states: Secondary | ICD-10-CM

## 2019-07-13 DIAGNOSIS — C50412 Malignant neoplasm of upper-outer quadrant of left female breast: Secondary | ICD-10-CM

## 2019-07-13 DIAGNOSIS — Z171 Estrogen receptor negative status [ER-]: Secondary | ICD-10-CM

## 2019-07-13 NOTE — Progress Notes (Signed)
Patient ID: Shauniece Greene, female    DOB: December 09, 1955, 63 y.o.   MRN: [id number]   Chief Complaint  Patient presents with  . Follow-up    2 weeks    Miranda Greene is a 63 yo female who presents for follow up after immediate bilateral breast reconstruction with placement of tissue expanders and flex HD on 06/21/19 by Dr. Marla Roe. Mastectomies performed by Dr. Ninfa Linden. The JP drains were removed 2 weeks ago. She states she did well after her last expansion. She still has some soreness in her axilla and chest. She took a valium 2 nights ago, but has not taken any pain medication since last week. Patient states she feels good about her current size, but would like to be symmetrical. Patient states "they don't look the same right now." Patient is working from home.   Review of Systems  Past Medical History:  Diagnosis Date  . Arthritis    Hands, feet  . Cancer (HCC)    breast  . Diabetes mellitus   . GERD (gastroesophageal reflux disease)   . Hyperlipidemia   . Lupus [facility])     Past Surgical History:  Procedure Laterality Date  . ABDOMINAL HYSTERECTOMY    . BLADDER SURGERY    . BREAST EXCISIONAL BIOPSY Right    benign 2009  . BREAST RECONSTRUCTION WITH PLACEMENT OF TISSUE EXPANDER AND FLEX HD (ACELLULAR HYDRATED DERMIS) Bilateral 06/21/2019   Procedure: BREAST RECONSTRUCTION WITH PLACEMENT OF TISSUE EXPANDER AND FLEX HD (ACELLULAR HYDRATED DERMIS);  Surgeon: Wallace Going, DO;  Location: [place];  Service: Plastics;  Laterality: Bilateral;  . MASTECTOMY Bilateral 06/21/2019  . MASTECTOMY W/ SENTINEL NODE BIOPSY Bilateral 06/21/2019   Procedure: BILATERAL MASTECTOMIES;  Surgeon: Coralie Keens, MD;  Location: [place];  Service: General;  Laterality: Bilateral;  . SENTINEL NODE BIOPSY Left 06/21/2019   Procedure: Left Sentinel Lymph Node Biopsy;  Surgeon: Coralie Keens, MD;  Location: [place];  Service: General;  Laterality: Left;      Current Outpatient  Medications:  .  ibuprofen (ADVIL,MOTRIN) 200 MG tablet, Take 200 mg by mouth every 6 (six) hours as needed for moderate pain. , Disp: , Rfl:  .  insulin glargine (LANTUS) 100 UNIT/ML injection, Inject 20 Units into the skin at bedtime. , Disp: , Rfl:  .  metFORMIN (GLUCOPHAGE) 1000 MG tablet, Take 1 tablet (1,000 mg total) by mouth 2 (two) times daily with a meal. For diabetes., Disp: 180 tablet, Rfl: 3 .  mupirocin ointment (BACTROBAN) 2 %, Place 1 application into the nose at bedtime. Apply  around the left side mouth, Disp: , Rfl:  .  nystatin cream (MYCOSTATIN), Apply 1 application topically 2 (two) times daily. (Patient taking differently: Apply 1 application topically at bedtime. Apply to right side of mouth at bedtime), Disp: 30 g, Rfl: 0 .  omeprazole (PRILOSEC) 20 MG capsule, Take 20 mg by mouth daily., Disp: , Rfl:  .  pravastatin (PRAVACHOL) 20 MG tablet, Take 1 tablet (20 mg total) by mouth daily. For cholesterol., Disp: 90 tablet, Rfl: 3   Objective:   Vitals:   07/13/19 1427  BP: 128/76  Pulse: 93  Temp: (!) 96.6 F (35.9 C)  SpO2: 97%    Physical Exam  General: alert, calm, no acute distress HEENT: normocephalic  Neck: supple, full ROM Chest: symmetrical rise and fall Lungs: unlabored breathing Breast: bilateral mastectomies with clean, dry, intact incisions, right breast slightly larger than left, skin indention of  left upper chest, tender to palpation; no erythema, edema, or drainage Cardiac: +2 bilateral radial pulse Abdomen: soft, non-distended Musculoskeletal: MAEx4 Neuro: A&O x3, calm, cooperative, steady gait Skin: warm, dry  Assessment & Plan:  Malignant neoplasm of upper-outer quadrant of left breast in female, estrogen receptor negative ([place])  Status post bilateral breast reconstruction  Miranda Greene is a 63 yo female POD #22 s/p bilateral mastectomies with immediate bilateral breast reconstruction with tissue expander and flex HD. She has some  soreness, but has not taken any pain medication in the past week. She was encouraged to take tylenol as needed for pain. She has made it clear she is comfortable with the current breast size and is most concerned about breast symmetry.  We placed injectable saline in the Expander using a sterile technique:  Right: 25 cc for a total of 250 / 455 cc Left: 50 cc for a total of 275 / 455 cc  Return in 2 weeks.   Alfredo Batty, NP

## 2019-07-27 ENCOUNTER — Encounter: Payer: Self-pay | Admitting: Surgical

## 2019-07-27 ENCOUNTER — Other Ambulatory Visit: Payer: Self-pay

## 2019-07-27 ENCOUNTER — Ambulatory Visit (INDEPENDENT_AMBULATORY_CARE_PROVIDER_SITE_OTHER): Payer: BC Managed Care – PPO | Admitting: Surgical

## 2019-07-27 VITALS — BP 126/81 | HR 86 | Temp 96.9°F | Ht 63.0 in | Wt 139.6 lb

## 2019-07-27 DIAGNOSIS — C50412 Malignant neoplasm of upper-outer quadrant of left female breast: Secondary | ICD-10-CM

## 2019-07-27 DIAGNOSIS — Z171 Estrogen receptor negative status [ER-]: Secondary | ICD-10-CM

## 2019-07-27 DIAGNOSIS — Z9889 Other specified postprocedural states: Secondary | ICD-10-CM

## 2019-07-27 MED ORDER — DIAZEPAM 2 MG PO TABS: 2.0000 mg | ORAL_TABLET | Freq: Two times a day (BID) | ORAL | 0 refills | Status: DC | PRN

## 2019-07-27 NOTE — Progress Notes (Signed)
   Subjective:     Patient ID: Miranda Greene, female    DOB: 09-Jun-1956, 63 y.o.   MRN: [id number]  Chief Complaint  Patient presents with  . Follow-up    2 weeks    HPI: The patient is a 63 y.o. female here for follow-up on her bilateral breast reconstruction.  She reports that overall she is doing well, but her left axilla/breast has been tender and sore.  It is worse with palpation.  She takes Valium as needed, but has not been taking it frequently. Her incisions are healing well.  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  She reports that she has been very active and doing a lot around the house such as dishes and laundry.  She has no pain in her right breast.  Her left breast has some banding across the superior medial and superior aspect, possibly the capsule which is causing her some pain.  She denies any fever, chills, nausea, vomiting.  Review of Systems  Constitutional: Positive for activity change. Negative for appetite change, chills, diaphoresis, fatigue and fever.  Respiratory: Negative.   Cardiovascular: Negative.   Gastrointestinal: Negative.   Genitourinary: Negative.   Musculoskeletal: Positive for myalgias (Left breast soreness).  Skin: Negative for color change, pallor, rash and wound.  Neurological: Negative.   Psychiatric/Behavioral: Negative.      Objective:   Vital Signs BP 126/81 (BP Location: Left Arm, Patient Position: Sitting, Cuff Size: Normal)   Pulse 86   Temp (!) 96.9 F (36.1 C) (Temporal)   Ht 5\' 3"  (1.6 m)   Wt 139 lb 9.6 oz (63.3 kg)   SpO2 97%   BMI 24.73 kg/m  Vital Signs and Nursing Note Reviewed Chaperone present Physical Exam  Constitutional: She is oriented to person, place, and time and well-developed, well-nourished, and in no distress.  HENT:  Head: Normocephalic and atraumatic.  Cardiovascular: Normal rate.  Pulmonary/Chest: Effort normal.    Musculoskeletal: Normal range of motion.  Neurological:  She is alert and oriented to person, place, and time. Gait normal.  Skin: Skin is warm and dry. No rash noted. She is not diaphoretic. No erythema. No pallor.  Psychiatric: Mood and affect normal.    Assessment/Plan:     ICD-10-CM   1. Malignant neoplasm of upper-outer quadrant of left breast in female, estrogen receptor negative ([place])  C50.412    Z17.1   2. Status post bilateral breast reconstruction  Z98.890      Miranda Greene is doing well, she is still having some left breast/axilla soreness.  Recommend range of motion exercises, such as crawling and up the wall to increase range of motion.  Recommend physical therapy, she would like to talk more about this at her next visit but not at this time.  We placed injectable saline in the Expander using a sterile technique: Right: 0 cc for a total of 250 / 455 cc Left: 25 cc for a total of 300 / 455 cc Her left breast looks smaller than the right, suspect likely due to banding along the superior aspect and possible movement of expander.  Will have patient follow-up with Dr. Marla Roe at her next visit.  Earliest for exchange surgery would be January to allow ADM to incorporate.  Pictures were obtained of the patient and placed in the chart with the patient's or guardian's permission.  Carola Rhine Helton Oleson, PA-C 07/27/2019, 1:38 PM

## 2019-08-10 ENCOUNTER — Ambulatory Visit (INDEPENDENT_AMBULATORY_CARE_PROVIDER_SITE_OTHER): Payer: BC Managed Care – PPO | Admitting: Plastic Surgery

## 2019-08-10 ENCOUNTER — Other Ambulatory Visit: Payer: Self-pay

## 2019-08-10 ENCOUNTER — Encounter: Payer: Self-pay | Admitting: Plastic Surgery

## 2019-08-10 VITALS — BP 137/84 | HR 86 | Temp 98.1°F | Ht 63.0 in | Wt 138.0 lb

## 2019-08-10 DIAGNOSIS — Z9889 Other specified postprocedural states: Secondary | ICD-10-CM

## 2019-08-10 DIAGNOSIS — D0512 Intraductal carcinoma in situ of left breast: Secondary | ICD-10-CM

## 2019-08-10 NOTE — Progress Notes (Signed)
   Subjective:    Patient ID: Miranda Greene, female    DOB: 1956-03-25, 63 y.o.   MRN: [id number]  The patient is a 63 year old female here for follow-up.  She underwent bilateral mastectomies and has expanders in place.  She is doing well with the fill.  It does seem like she has a little bit of asymmetry.  On exam I think it is more of a chest asymmetry than an expander asymmetry.  She feels some uncomfortable burning pain in the left lateral chest area.  This may be where the expander is pushing on one of her ribs.  The incisions are healing nicely and she is pleased with her size.     Review of Systems  Constitutional: Negative.   Eyes: Negative.   Respiratory: Negative.   Cardiovascular: Negative.   Gastrointestinal: Negative.   Genitourinary: Negative.   Musculoskeletal: Negative.   Psychiatric/Behavioral: Negative.        Objective:   Physical Exam Vitals signs and nursing note reviewed.  Constitutional:      Appearance: Normal appearance.  Cardiovascular:     Rate and Rhythm: Normal rate.  Pulmonary:     Effort: Pulmonary effort is normal.  Neurological:     General: No focal deficit present.     Mental Status: She is alert and oriented to person, place, and time.  Psychiatric:        Mood and Affect: Mood normal.        Behavior: Behavior normal.         Assessment & Plan:     ICD-10-CM   1. Ductal carcinoma in situ (DCIS) of left breast  D05.12   2. S/P breast reconstruction, bilateral  Z98.890     We placed injectable saline in the Expander using a sterile technique: Right: 50 cc for a total of 300 / 455 cc Left: 50 cc for a total of 350 / 455 cc We will plan for exchange in January. Pictures were obtained of the patient and placed in the chart with the patient's or guardian's permission.

## 2019-08-12 ENCOUNTER — Other Ambulatory Visit: Payer: Self-pay | Admitting: Primary Care

## 2019-08-17 DIAGNOSIS — E119 Type 2 diabetes mellitus without complications: Secondary | ICD-10-CM

## 2019-08-17 MED ORDER — INSULIN GLARGINE 100 UNIT/ML ~~LOC~~ SOLN: 20.0000 [IU] | Freq: Every day | SUBCUTANEOUS | 0 refills | Status: DC

## 2019-08-17 NOTE — Telephone Encounter (Signed)
Okay to Refill?

## 2019-08-19 ENCOUNTER — Ambulatory Visit: Payer: BC Managed Care – PPO | Admitting: Primary Care

## 2019-08-23 MED ORDER — LANTUS SOLOSTAR 100 UNIT/ML ~~LOC~~ SOPN: 20.0000 [IU] | PEN_INJECTOR | Freq: Every day | SUBCUTANEOUS | 3 refills | Status: DC

## 2019-08-31 ENCOUNTER — Encounter (HOSPITAL_BASED_OUTPATIENT_CLINIC_OR_DEPARTMENT_OTHER): Payer: Self-pay | Admitting: Plastic Surgery

## 2019-08-31 ENCOUNTER — Other Ambulatory Visit: Payer: Self-pay

## 2019-09-01 ENCOUNTER — Encounter: Payer: Self-pay | Admitting: Plastic Surgery

## 2019-09-01 ENCOUNTER — Ambulatory Visit (INDEPENDENT_AMBULATORY_CARE_PROVIDER_SITE_OTHER): Payer: BC Managed Care – PPO | Admitting: Primary Care

## 2019-09-01 ENCOUNTER — Encounter: Payer: Self-pay | Admitting: Primary Care

## 2019-09-01 ENCOUNTER — Ambulatory Visit (INDEPENDENT_AMBULATORY_CARE_PROVIDER_SITE_OTHER): Payer: BC Managed Care – PPO | Admitting: Surgical

## 2019-09-01 ENCOUNTER — Encounter: Payer: Self-pay | Admitting: Surgical

## 2019-09-01 VITALS — BP 132/84 | HR 85 | Temp 97.3°F | Ht 63.0 in | Wt 141.0 lb

## 2019-09-01 VITALS — BP 130/74 | HR 82 | Temp 96.5°F | Ht 63.0 in | Wt 141.1 lb

## 2019-09-01 DIAGNOSIS — E119 Type 2 diabetes mellitus without complications: Secondary | ICD-10-CM

## 2019-09-01 DIAGNOSIS — E785 Hyperlipidemia, unspecified: Secondary | ICD-10-CM

## 2019-09-01 DIAGNOSIS — D0512 Intraductal carcinoma in situ of left breast: Secondary | ICD-10-CM | POA: Diagnosis not present

## 2019-09-01 DIAGNOSIS — C50412 Malignant neoplasm of upper-outer quadrant of left female breast: Secondary | ICD-10-CM

## 2019-09-01 DIAGNOSIS — Z171 Estrogen receptor negative status [ER-]: Secondary | ICD-10-CM

## 2019-09-01 DIAGNOSIS — Z9889 Other specified postprocedural states: Secondary | ICD-10-CM

## 2019-09-01 LAB — POCT GLYCOSYLATED HEMOGLOBIN (HGB A1C): Hemoglobin A1C: 7.4 % — AB (ref 4.0–5.6)

## 2019-09-01 MED ORDER — CEPHALEXIN 500 MG PO CAPS: 500.0000 mg | ORAL_CAPSULE | Freq: Four times a day (QID) | ORAL | 0 refills | Status: AC

## 2019-09-01 MED ORDER — HYDROCODONE-ACETAMINOPHEN 5-325 MG PO TABS: 1.0000 | ORAL_TABLET | Freq: Four times a day (QID) | ORAL | 0 refills | Status: AC | PRN

## 2019-09-01 MED ORDER — METFORMIN HCL 1000 MG PO TABS: 1000.0000 mg | ORAL_TABLET | Freq: Two times a day (BID) | ORAL | 3 refills | Status: DC

## 2019-09-01 NOTE — Progress Notes (Signed)
Subjective:    Patient ID: Miranda Greene, female    DOB: 04-03-1956, 63 y.o.   MRN: [id number]  HPI  Ms. Kritz is a 63 year old female with a history of type 2 diabetes, hyperlipidemia, ductal carcinoma of left breast who presents today for follow up of diabetes.  Current medications include: Metformin 1000 mg BID, Lantus 20 units.  She is checking her blood glucose 1 times daily and is getting readings of:  AM fasting: 90's Middle of the night: 140's  Last A1C: 7.8 in September 2020, 7.4 today Last Eye Exam: No recent diabetic eye exam Last Foot Exam: Due in June 2021 Pneumonia Vaccination: Completed in 2017 ACE/ARB: none. Urine microalbumin negative in June 2020 Statin: none, LDL of 109 in September 2020. Failed numerous statins in the past due to myalgias. She's not taking pravastatin.   BP Readings from Last 3 Encounters:  09/01/19 130/74  08/10/19 137/84  07/27/19 126/81    She endorses a poor diet and is eating "whatever I want".     Review of Systems  Eyes: Negative for visual disturbance.  Respiratory: Negative for shortness of breath.   Cardiovascular: Negative for chest pain.  Neurological: Negative for dizziness and numbness.       Past Medical History:  Diagnosis Date  . Arthritis    Hands, feet  . Cancer (HCC)    breast  . Diabetes mellitus   . GERD (gastroesophageal reflux disease)   . Hyperlipidemia   . Lupus ([place])      Social History   Socioeconomic History  . Marital status: Married    Spouse name: Not on file  . Number of children: Not on file  . Years of education: Not on file  . Highest education level: Not on file  Occupational History  . Not on file  Tobacco Use  . Smoking status: Never Smoker  . Smokeless tobacco: Never Used  Substance and Sexual Activity  . Alcohol use: No  . Drug use: No  . Sexual activity: Not on file  Other Topics Concern  . Not on file  Social History Narrative   Married.   4 children.    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   Enjoys reading, spending time with her grandchildren.   Social Determinants of Health   Financial Resource Strain:   . Difficulty of Paying Living Expenses: Not on file  Food Insecurity:   . Worried About Charity fundraiser in the Last Year: Not on file  . Ran Out of Food in the Last Year: Not on file  Transportation Needs:   . Lack of Transportation (Medical): Not on file  . Lack of Transportation (Non-Medical): Not on file  Physical Activity:   . Days of Exercise per Week: Not on file  . Minutes of Exercise per Session: Not on file  Stress:   . Feeling of Stress : Not on file  Social Connections:   . Frequency of Communication with Friends and Family: Not on file  . Frequency of Social Gatherings with Friends and Family: Not on file  . Attends Religious Services: Not on file  . Active Member of Clubs or Organizations: Not on file  . Attends Archivist Meetings: Not on file  . Marital Status: Not on file  Intimate Partner Violence:   . Fear of Current or Ex-Partner: Not on file  . Emotionally Abused: Not on file  . Physically  Abused: Not on file  . Sexually Abused: Not on file    Past Surgical History:  Procedure Laterality Date  . ABDOMINAL HYSTERECTOMY    . BLADDER SURGERY    . BREAST EXCISIONAL BIOPSY Right    benign 2009  . BREAST RECONSTRUCTION WITH PLACEMENT OF TISSUE EXPANDER AND FLEX HD (ACELLULAR HYDRATED DERMIS) Bilateral 06/21/2019   Procedure: BREAST RECONSTRUCTION WITH PLACEMENT OF TISSUE EXPANDER AND FLEX HD (ACELLULAR HYDRATED DERMIS);  Surgeon: Wallace Going, DO;  Location: [facility];  Service: Plastics;  Laterality: Bilateral;  . MASTECTOMY Bilateral 06/21/2019  . MASTECTOMY W/ SENTINEL NODE BIOPSY Bilateral 06/21/2019   Procedure: BILATERAL MASTECTOMIES;  Surgeon: Coralie Keens, MD;  Location: [facility];  Service: General;  Laterality: Bilateral;  . SENTINEL NODE BIOPSY Left  06/21/2019   Procedure: Left Sentinel Lymph Node Biopsy;  Surgeon: Coralie Keens, MD;  Location: [facility];  Service: General;  Laterality: Left;    Family History  Problem Relation Age of Onset  . Alcohol abuse Father   . Lung cancer Father   . Heart disease Father   . Stroke Maternal Grandmother   . Arthritis Paternal Grandmother   . Heart disease Paternal Grandmother   . Diabetes Paternal Grandmother   . Arthritis Mother   . Stroke Mother   . Kidney disease Mother   . Diabetes Mother   . Dementia Mother     Allergies  Allergen Reactions  . Meloxicam Hives  . Invokana [Canagliflozin] Rash    Current Outpatient Medications on File Prior to Visit  Medication Sig Dispense Refill  . diazepam (VALIUM) 2 MG tablet Take 1 tablet (2 mg total) by mouth every 12 (twelve) hours as needed for anxiety. 20 tablet 0  . ibuprofen (ADVIL,MOTRIN) 200 MG tablet Take 200 mg by mouth every 6 (six) hours as needed for moderate pain.     . Insulin Glargine (LANTUS SOLOSTAR) 100 UNIT/ML Solostar Pen Inject 20 Units into the skin at bedtime. 15 mL 3  . omeprazole (PRILOSEC) 20 MG capsule Take 20 mg by mouth daily.    . pravastatin (PRAVACHOL) 20 MG tablet Take 20 mg by mouth daily.     No current facility-administered medications on file prior to visit.    BP 130/74   Pulse 82   Temp (!) 96.5 F (35.8 C) (Temporal)   Ht 5\' 3"  (1.6 m)   Wt 141 lb 1 oz (64 kg)   SpO2 97%   BMI 24.99 kg/m    Objective:   Physical Exam  Constitutional: She appears well-nourished.  Cardiovascular: Normal rate and regular rhythm.  Respiratory: Effort normal and breath sounds normal.  Musculoskeletal:     Cervical back: Neck supple.  Skin: Skin is warm and dry.  Psychiatric: She has a normal mood and affect.           Assessment & Plan:

## 2019-09-01 NOTE — H&P (View-Only) (Signed)
Patient ID: Miranda Greene, female    DOB: 02-17-1956, 63 y.o.   MRN: [id number]  Chief Complaint  Patient presents with  . Pre-op Exam      ICD-10-CM   1. Ductal carcinoma in situ (DCIS) of left breast  D05.12   2. Status post bilateral breast reconstruction  Z98.890   3. Malignant neoplasm of upper-outer quadrant of left breast in female, estrogen receptor negative ([place])  C50.412    Z17.1   4. Type 2 diabetes mellitus without complication, without long-term current use of insulin (HCC)  E11.9      History of Present Illness: Miranda Greene is a 63 y.o.  female  with a history of bilateral breast cancer, she underwent mastectomies with Dr. Nedra Hai on 06/21/2019 followed by immediate reconstruction by Dr. Marla Roe.  She currently has expanders in place and is happy with the current size.  She has a healed really well. she presents for preoperative evaluation for upcoming procedure, removal of bilateral tissue expanders with placement of bilateral silicone breast implants, scheduled for 09/08/2019 with Dr. Marla Roe.  She has overall been feeling well.  No recent colds or illnesses.  She has tolerated anesthesia fine in the past without any adverse reactions.  Her incisions are well-healed.  No history or family history of DVT/PE.  She is not on any hormone replacement therapy.  She saw her PCP yesterday. She has a hx of DM, GERD, HLD. Her chronic conditions are fairly well controlled. A1c today 7.4, 7.8 3 months ago. Most recent CBC normal with exception of 11.9 hgb (06/22/19).  Past Medical History: Allergies: Allergies  Allergen Reactions  . Meloxicam Hives  . Invokana [Canagliflozin] Rash    Current Medications:  Current Outpatient Medications:  .  cephALEXin (KEFLEX) 500 MG capsule, Take 1 capsule (500 mg total) by mouth 4 (four) times daily for 5 days., Disp: 20 capsule, Rfl: 0 .  diazepam (VALIUM) 2 MG tablet, Take 1 tablet (2 mg total) by mouth every 12  (twelve) hours as needed for anxiety., Disp: 20 tablet, Rfl: 0 .  HYDROcodone-acetaminophen (NORCO) 5-325 MG tablet, Take 1 tablet by mouth every 6 (six) hours as needed for up to 3 days for severe pain., Disp: 12 tablet, Rfl: 0 .  ibuprofen (ADVIL,MOTRIN) 200 MG tablet, Take 200 mg by mouth every 6 (six) hours as needed for moderate pain. , Disp: , Rfl:  .  Insulin Glargine (LANTUS SOLOSTAR) 100 UNIT/ML Solostar Pen, Inject 20 Units into the skin at bedtime., Disp: 15 mL, Rfl: 3 .  metFORMIN (GLUCOPHAGE) 1000 MG tablet, Take 1 tablet (1,000 mg total) by mouth 2 (two) times daily with a meal. For diabetes., Disp: 180 tablet, Rfl: 3 .  omeprazole (PRILOSEC) 20 MG capsule, Take 20 mg by mouth daily., Disp: , Rfl:  .  pravastatin (PRAVACHOL) 20 MG tablet, Take 20 mg by mouth daily., Disp: , Rfl:   Past Medical Problems: Past Medical History:  Diagnosis Date  . Arthritis    Hands, feet  . Cancer (HCC)    breast  . Diabetes mellitus   . GERD (gastroesophageal reflux disease)   . Hyperlipidemia   . Lupus [facility])     Past Surgical History: Past Surgical History:  Procedure Laterality Date  . ABDOMINAL HYSTERECTOMY    . BLADDER SURGERY    . BREAST EXCISIONAL BIOPSY Right    benign 2009  . BREAST RECONSTRUCTION WITH PLACEMENT OF TISSUE EXPANDER AND FLEX HD (ACELLULAR HYDRATED  DERMIS) Bilateral 06/21/2019   Procedure: BREAST RECONSTRUCTION WITH PLACEMENT OF TISSUE EXPANDER AND FLEX HD (ACELLULAR HYDRATED DERMIS);  Surgeon: Wallace Going, DO;  Location: [place];  Service: Plastics;  Laterality: Bilateral;  . MASTECTOMY Bilateral 06/21/2019  . MASTECTOMY W/ SENTINEL NODE BIOPSY Bilateral 06/21/2019   Procedure: BILATERAL MASTECTOMIES;  Surgeon: Coralie Keens, MD;  Location: [place];  Service: General;  Laterality: Bilateral;  . SENTINEL NODE BIOPSY Left 06/21/2019   Procedure: Left Sentinel Lymph Node Biopsy;  Surgeon: Coralie Keens, MD;  Location: [facility];  Service: General;   Laterality: Left;    Social History: Social History   Socioeconomic History  . Marital status: Married    Spouse name: Not on file  . Number of children: Not on file  . Years of education: Not on file  . Highest education level: Not on file  Occupational History  . Not on file  Tobacco Use  . Smoking status: Never Smoker  . Smokeless tobacco: Never Used  Substance and Sexual Activity  . Alcohol use: No  . Drug use: No  . Sexual activity: Not on file  Other Topics Concern  . Not on file  Social History Narrative   Married.   4 children.    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   Social Determinants of Health   Financial Resource Strain:   . Difficulty of Paying Living Expenses: Not on file  Food Insecurity:   . Worried About Charity fundraiser in the Last Year: Not on file  . Ran Out of Food in the Last Year: Not on file  Transportation Needs:   . Lack of Transportation (Medical): Not on file  . Lack of Transportation (Non-Medical): Not on file  Physical Activity:   . Days of Exercise per Week: Not on file  . Minutes of Exercise per Session: Not on file  Stress:   . Feeling of Stress : Not on file  Social Connections:   . Frequency of Communication with Friends and Family: Not on file  . Frequency of Social Gatherings with Friends and Family: Not on file  . Attends Religious Services: Not on file  . Active Member of Clubs or Organizations: Not on file  . Attends Archivist Meetings: Not on file  . Marital Status: Not on file  Intimate Partner Violence:   . Fear of Current or Ex-Partner: Not on file  . Emotionally Abused: Not on file  . Physically Abused: Not on file  . Sexually Abused: Not on file    Family History: Family History  Problem Relation Age of Onset  . Alcohol abuse Father   . Lung cancer Father   . Heart disease Father   . Stroke Maternal Grandmother    . Arthritis Paternal Grandmother   . Heart disease Paternal Grandmother   . Diabetes Paternal Grandmother   . Arthritis Mother   . Stroke Mother   . Kidney disease Mother   . Diabetes Mother   . Dementia Mother     Review of Systems: Review of Systems  Constitutional: Negative for chills, diaphoresis, fever, malaise/fatigue and weight loss.  Respiratory: Negative for cough and shortness of breath.   Cardiovascular: Negative for chest pain, palpitations and leg swelling.  Gastrointestinal: Negative for diarrhea, nausea and vomiting.  Skin: Negative for itching and rash.  Neurological: Negative for dizziness and weakness.    Physical Exam:  Vital Signs BP 132/84 (BP Location: Left Arm, Patient Position: Sitting, Cuff Size: Normal)   Pulse 85   Temp (!) 97.3 F (36.3 C) (Temporal)   Ht 5\' 3"  (1.6 m)   Wt 141 lb (64 kg)   SpO2 96%   BMI 24.98 kg/m  Physical Exam Exam conducted with a chaperone present.  Constitutional:      General: She is not in acute distress.    Appearance: Normal appearance. She is not ill-appearing.  HENT:     Head: Normocephalic and atraumatic.  Eyes:     Pupils: Pupils are equal, round Neck:     Musculoskeletal: Normal range of motion.  Cardiovascular:     Rate and Rhythm: Normal rate and regular rhythm.     Pulses: Normal pulses.     Heart sounds: Normal heart sounds. No murmur.  Pulmonary:     Effort: Pulmonary effort is normal. No respiratory distress.     Breath sounds: Normal breath sounds. No wheezing.  : mastectomy incisions well healed, c/d/i, no erythema, still some Breastdermabond flaking off. Expanders in place, fairly symmetric. Abdominal:     General: Abdomen is flat. There is no distension.     Palpations: Abdomen is soft.     Tenderness: There is no abdominal tenderness.  Musculoskeletal: Normal range of motion.  Skin:    General: Skin is warm and dry.     Findings: No erythema or rash.  Neurological:     General: No  focal deficit present.     Mental Status: She is alert and oriented to person, place, and time. Mental status is at baseline.     Motor: No weakness.  Psychiatric:        Mood and Affect: Mood normal.        Behavior: Behavior normal.   Physical Exam   Assessment/Plan: The patient is scheduled for expander to implant exchange with Dr. Marla Roe on 12/30.  Risks, benefits, and alternatives of procedure discussed, questions answered and consent obtained.   Rx for abx and pain medication sent to pharmacy. Patient also informed to do 600mg  ibuprofen for 48-72hrs post op q6hrs, supplement with norco if needed.  COVID scheduled.  Thoroughly covered consent book with patient in regards to expander/implant risks and general surgical risks. Verbal consent obtained.  She currently has 300/455 ml in Right breast She currently has 350/455 ml in Left breast   Electronically signed by: Carola Rhine Illiana Losurdo, PA-C 09/01/2019 11:46 AM

## 2019-09-01 NOTE — Assessment & Plan Note (Signed)
Slight decrease in A1C, not quite at goal of <7.0 which is likely dietary. Continue Lantus 20 units given fasting AM readings of 70's-90's. Continue Metformin.  Discussed to resume statin. Urine microalbumin negative in June 2020. Foot exam UTD. Pneumonia vaccination UTD. She will schedule eye exam.  Follow up in 6 months.

## 2019-09-01 NOTE — Assessment & Plan Note (Signed)
Following with oncology, scheduled for surgery in late December 2020.

## 2019-09-01 NOTE — Assessment & Plan Note (Signed)
Non compliant to pravastatin but tolerates well overall. Discussed to resume given risks of CVD, she will resume. Pravastatin added back to medication list.

## 2019-09-01 NOTE — Patient Instructions (Signed)
Continue Lantus 20 units daily and Metformin 1000 mg twice daily for diabetes.  Please notify me if you see readings at or above 150 on a consistent basis.  Resume pravastatin 20 mg once daily for cholesterol and prevention of heart disease/stroke.  Please schedule a physical with me for 6 months.   It was a pleasure to see you today!

## 2019-09-01 NOTE — Progress Notes (Signed)
Patient ID: Miranda Greene, female    DOB: 12/29/1955, 63 y.o.   MRN: [id number]  Chief Complaint  Patient presents with  . Pre-op Exam      ICD-10-CM   1. Ductal carcinoma in situ (DCIS) of left breast  D05.12   2. Status post bilateral breast reconstruction  Z98.890   3. Malignant neoplasm of upper-outer quadrant of left breast in female, estrogen receptor negative ([place])  C50.412    Z17.1   4. Type 2 diabetes mellitus without complication, without long-term current use of insulin (HCC)  E11.9      History of Present Illness: Miranda Greene is a 63 y.o.  female  with a history of bilateral breast cancer, she underwent mastectomies with Dr. Nedra Hai on 06/21/2019 followed by immediate reconstruction by Dr. Marla Roe.  She currently has expanders in place and is happy with the current size.  She has a healed really well. she presents for preoperative evaluation for upcoming procedure, removal of bilateral tissue expanders with placement of bilateral silicone breast implants, scheduled for 09/08/2019 with Dr. Marla Roe.  She has overall been feeling well.  No recent colds or illnesses.  She has tolerated anesthesia fine in the past without any adverse reactions.  Her incisions are well-healed.  No history or family history of DVT/PE.  She is not on any hormone replacement therapy.  She saw her PCP yesterday. She has a hx of DM, GERD, HLD. Her chronic conditions are fairly well controlled. A1c today 7.4, 7.8 3 months ago. Most recent CBC normal with exception of 11.9 hgb (06/22/19).  Past Medical History: Allergies: Allergies  Allergen Reactions  . Meloxicam Hives  . Invokana [Canagliflozin] Rash    Current Medications:  Current Outpatient Medications:  .  cephALEXin (KEFLEX) 500 MG capsule, Take 1 capsule (500 mg total) by mouth 4 (four) times daily for 5 days., Disp: 20 capsule, Rfl: 0 .  diazepam (VALIUM) 2 MG tablet, Take 1 tablet (2 mg total) by mouth every 12  (twelve) hours as needed for anxiety., Disp: 20 tablet, Rfl: 0 .  HYDROcodone-acetaminophen (NORCO) 5-325 MG tablet, Take 1 tablet by mouth every 6 (six) hours as needed for up to 3 days for severe pain., Disp: 12 tablet, Rfl: 0 .  ibuprofen (ADVIL,MOTRIN) 200 MG tablet, Take 200 mg by mouth every 6 (six) hours as needed for moderate pain. , Disp: , Rfl:  .  Insulin Glargine (LANTUS SOLOSTAR) 100 UNIT/ML Solostar Pen, Inject 20 Units into the skin at bedtime., Disp: 15 mL, Rfl: 3 .  metFORMIN (GLUCOPHAGE) 1000 MG tablet, Take 1 tablet (1,000 mg total) by mouth 2 (two) times daily with a meal. For diabetes., Disp: 180 tablet, Rfl: 3 .  omeprazole (PRILOSEC) 20 MG capsule, Take 20 mg by mouth daily., Disp: , Rfl:  .  pravastatin (PRAVACHOL) 20 MG tablet, Take 20 mg by mouth daily., Disp: , Rfl:   Past Medical Problems: Past Medical History:  Diagnosis Date  . Arthritis    Hands, feet  . Cancer (HCC)    breast  . Diabetes mellitus   . GERD (gastroesophageal reflux disease)   . Hyperlipidemia   . Lupus [facility])     Past Surgical History: Past Surgical History:  Procedure Laterality Date  . ABDOMINAL HYSTERECTOMY    . BLADDER SURGERY    . BREAST EXCISIONAL BIOPSY Right    benign 2009  . BREAST RECONSTRUCTION WITH PLACEMENT OF TISSUE EXPANDER AND FLEX HD (ACELLULAR HYDRATED  DERMIS) Bilateral 06/21/2019   Procedure: BREAST RECONSTRUCTION WITH PLACEMENT OF TISSUE EXPANDER AND FLEX HD (ACELLULAR HYDRATED DERMIS);  Surgeon: Wallace Going, DO;  Location: [place];  Service: Plastics;  Laterality: Bilateral;  . MASTECTOMY Bilateral 06/21/2019  . MASTECTOMY W/ SENTINEL NODE BIOPSY Bilateral 06/21/2019   Procedure: BILATERAL MASTECTOMIES;  Surgeon: Coralie Keens, MD;  Location: [place];  Service: General;  Laterality: Bilateral;  . SENTINEL NODE BIOPSY Left 06/21/2019   Procedure: Left Sentinel Lymph Node Biopsy;  Surgeon: Coralie Keens, MD;  Location: [place];  Service: General;   Laterality: Left;    Social History: Social History   Socioeconomic History  . Marital status: Married    Spouse name: Not on file  . Number of children: Not on file  . Years of education: Not on file  . Highest education level: Not on file  Occupational History  . Not on file  Tobacco Use  . Smoking status: Never Smoker  . Smokeless tobacco: Never Used  Substance and Sexual Activity  . Alcohol use: No  . Drug use: No  . Sexual activity: Not on file  Other Topics Concern  . Not on file  Social History Narrative   Married.   4 children.    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   Social Determinants of Health   Financial Resource Strain:   . Difficulty of Paying Living Expenses: Not on file  Food Insecurity:   . Worried About Charity fundraiser in the Last Year: Not on file  . Ran Out of Food in the Last Year: Not on file  Transportation Needs:   . Lack of Transportation (Medical): Not on file  . Lack of Transportation (Non-Medical): Not on file  Physical Activity:   . Days of Exercise per Week: Not on file  . Minutes of Exercise per Session: Not on file  Stress:   . Feeling of Stress : Not on file  Social Connections:   . Frequency of Communication with Friends and Family: Not on file  . Frequency of Social Gatherings with Friends and Family: Not on file  . Attends Religious Services: Not on file  . Active Member of Clubs or Organizations: Not on file  . Attends Archivist Meetings: Not on file  . Marital Status: Not on file  Intimate Partner Violence:   . Fear of Current or Ex-Partner: Not on file  . Emotionally Abused: Not on file  . Physically Abused: Not on file  . Sexually Abused: Not on file    Family History: Family History  Problem Relation Age of Onset  . Alcohol abuse Father   . Lung cancer Father   . Heart disease Father   . Stroke Maternal Grandmother    . Arthritis Paternal Grandmother   . Heart disease Paternal Grandmother   . Diabetes Paternal Grandmother   . Arthritis Mother   . Stroke Mother   . Kidney disease Mother   . Diabetes Mother   . Dementia Mother     Review of Systems: Review of Systems  Constitutional: Negative for chills, diaphoresis, fever, malaise/fatigue and weight loss.  Respiratory: Negative for cough and shortness of breath.   Cardiovascular: Negative for chest pain, palpitations and leg swelling.  Gastrointestinal: Negative for diarrhea, nausea and vomiting.  Skin: Negative for itching and rash.  Neurological: Negative for dizziness and weakness.    Physical Exam:  Vital Signs BP 132/84 (BP Location: Left Arm, Patient Position: Sitting, Cuff Size: Normal)   Pulse 85   Temp (!) 97.3 F (36.3 C) (Temporal)   Ht 5\' 3"  (1.6 m)   Wt 141 lb (64 kg)   SpO2 96%   BMI 24.98 kg/m  Physical Exam Exam conducted with a chaperone present.  Constitutional:      General: She is not in acute distress.    Appearance: Normal appearance. She is not ill-appearing.  HENT:     Head: Normocephalic and atraumatic.  Eyes:     Pupils: Pupils are equal, round Neck:     Musculoskeletal: Normal range of motion.  Cardiovascular:     Rate and Rhythm: Normal rate and regular rhythm.     Pulses: Normal pulses.     Heart sounds: Normal heart sounds. No murmur.  Pulmonary:     Effort: Pulmonary effort is normal. No respiratory distress.     Breath sounds: Normal breath sounds. No wheezing.  : mastectomy incisions well healed, c/d/i, no erythema, still some Breastdermabond flaking off. Expanders in place, fairly symmetric. Abdominal:     General: Abdomen is flat. There is no distension.     Palpations: Abdomen is soft.     Tenderness: There is no abdominal tenderness.  Musculoskeletal: Normal range of motion.  Skin:    General: Skin is warm and dry.     Findings: No erythema or rash.  Neurological:     General: No  focal deficit present.     Mental Status: She is alert and oriented to person, place, and time. Mental status is at baseline.     Motor: No weakness.  Psychiatric:        Mood and Affect: Mood normal.        Behavior: Behavior normal.   Physical Exam   Assessment/Plan: The patient is scheduled for expander to implant exchange with Dr. Marla Roe on 12/30.  Risks, benefits, and alternatives of procedure discussed, questions answered and consent obtained.   Rx for abx and pain medication sent to pharmacy. Patient also informed to do 600mg  ibuprofen for 48-72hrs post op q6hrs, supplement with norco if needed.  COVID scheduled.  Thoroughly covered consent book with patient in regards to expander/implant risks and general surgical risks. Verbal consent obtained.  She currently has 300/455 ml in Right breast She currently has 350/455 ml in Left breast   Electronically signed by: Carola Rhine Gayle Martinez, PA-C 09/01/2019 11:46 AM

## 2019-09-02 ENCOUNTER — Encounter (HOSPITAL_BASED_OUTPATIENT_CLINIC_OR_DEPARTMENT_OTHER)
Admission: RE | Admit: 2019-09-02 | Discharge: 2019-09-02 | Disposition: A | Discharge status: Home / Self Care | Payer: BC Managed Care – PPO | Source: Ambulatory Visit | Attending: Plastic Surgery | Admitting: Plastic Surgery

## 2019-09-02 ENCOUNTER — Other Ambulatory Visit: Payer: Self-pay

## 2019-09-02 DIAGNOSIS — Z01812 Encounter for preprocedural laboratory examination: Secondary | ICD-10-CM | POA: Diagnosis not present

## 2019-09-02 LAB — BASIC METABOLIC PANEL
Anion gap: 10 (ref 5–15)
BUN: 19 mg/dL (ref 8–23)
CO2: 24 mmol/L (ref 22–32)
Calcium: 9.3 mg/dL (ref 8.9–10.3)
Chloride: 103 mmol/L (ref 98–111)
Creatinine, Ser: 0.94 mg/dL (ref 0.44–1.00)
GFR calc Af Amer: >60 mL/min (ref >60)
GFR calc non Af Amer: >60 mL/min (ref >60)
Glucose, Bld: 216 mg/dL — ABNORMAL HIGH (ref 70–99)
Potassium: 5 mmol/L (ref 3.5–5.1)
Sodium: 137 mmol/L (ref 135–145)

## 2019-09-04 ENCOUNTER — Other Ambulatory Visit (HOSPITAL_COMMUNITY)
Admission: RE | Admit: 2019-09-04 | Discharge: 2019-09-04 | Disposition: A | Discharge status: Home / Self Care | Payer: BC Managed Care – PPO | Source: Ambulatory Visit | Attending: Plastic Surgery | Admitting: Plastic Surgery

## 2019-09-04 DIAGNOSIS — Z20828 Contact with and (suspected) exposure to other viral communicable diseases: Secondary | ICD-10-CM | POA: Diagnosis not present

## 2019-09-04 DIAGNOSIS — Z01812 Encounter for preprocedural laboratory examination: Secondary | ICD-10-CM | POA: Insufficient documentation

## 2019-09-05 LAB — NOVEL CORONAVIRUS, NAA (HOSP ORDER, SEND-OUT TO REF LAB; TAT 18-24 HRS): SARS-CoV-2, NAA: NOT DETECTED

## 2019-09-07 NOTE — Anesthesia Preprocedure Evaluation (Addendum)
Anesthesia Evaluation  Patient identified by MRN, date of birth, ID band Patient awake    Reviewed: Allergy & Precautions, NPO status , Patient's Chart, lab work & pertinent test results  Airway Mallampati: I  TM Distance: >3 FB Neck ROM: Full    Dental no notable dental hx. (+) Dental Advisory Given, Edentulous Upper, Teeth Intact   Pulmonary neg pulmonary ROS,    Pulmonary exam normal        Cardiovascular negative cardio ROS Normal cardiovascular exam     Neuro/Psych negative neurological ROS  negative psych ROS   GI/Hepatic Neg liver ROS, GERD  Medicated and Controlled,Took omeprazole this AM   Endo/Other  diabetes, Well Controlled, Type 2, Insulin Dependent, Oral Hypoglycemic AgentsLast A1c 7.4, no lantus last night. FS this AM 202  Renal/GU negative Renal ROS  negative genitourinary   Musculoskeletal  (+) Arthritis , SLE   Abdominal Normal abdominal exam  (+)   Peds  Hematology   Anesthesia Other Findings Breast ca s/p mastectomy and tissue expanders  HLD  Reproductive/Obstetrics negative OB ROS                          Anesthesia Physical  Anesthesia Plan  ASA: II  Anesthesia Plan: General   Post-op Pain Management:    Induction: Intravenous  PONV Risk Score and Plan: 3 and Ondansetron, Midazolam, Dexamethasone and Treatment may vary due to age or medical condition  Airway Management Planned: Oral ETT  Additional Equipment: None  Intra-op Plan:   Post-operative Plan: Extubation in OR  Informed Consent: I have reviewed the patients History and Physical, chart, labs and discussed the procedure including the risks, benefits and alternatives for the proposed anesthesia with the patient or authorized representative who has indicated his/her understanding and acceptance.     Dental advisory given  Plan Discussed with: CRNA  Anesthesia Plan Comments:          Anesthesia Quick Evaluation

## 2019-09-08 ENCOUNTER — Ambulatory Visit (HOSPITAL_BASED_OUTPATIENT_CLINIC_OR_DEPARTMENT_OTHER): Payer: BC Managed Care – PPO | Admitting: Anesthesiology

## 2019-09-08 ENCOUNTER — Other Ambulatory Visit: Payer: Self-pay

## 2019-09-08 ENCOUNTER — Encounter (HOSPITAL_BASED_OUTPATIENT_CLINIC_OR_DEPARTMENT_OTHER): Payer: Self-pay | Admitting: Plastic Surgery

## 2019-09-08 ENCOUNTER — Ambulatory Visit (HOSPITAL_BASED_OUTPATIENT_CLINIC_OR_DEPARTMENT_OTHER)
Admission: RE | Admit: 2019-09-08 | Discharge: 2019-09-08 | Disposition: A | Discharge status: Home / Self Care | Payer: BC Managed Care – PPO | Attending: Plastic Surgery | Admitting: Plastic Surgery

## 2019-09-08 ENCOUNTER — Encounter (HOSPITAL_BASED_OUTPATIENT_CLINIC_OR_DEPARTMENT_OTHER)
Admission: RE | Disposition: A | Discharge status: Home / Self Care | Payer: Self-pay | Source: Home / Self Care | Attending: Plastic Surgery

## 2019-09-08 DIAGNOSIS — Z421 Encounter for breast reconstruction following mastectomy: Secondary | ICD-10-CM | POA: Diagnosis not present

## 2019-09-08 DIAGNOSIS — Z794 Long term (current) use of insulin: Secondary | ICD-10-CM | POA: Insufficient documentation

## 2019-09-08 DIAGNOSIS — E119 Type 2 diabetes mellitus without complications: Secondary | ICD-10-CM | POA: Insufficient documentation

## 2019-09-08 DIAGNOSIS — Z171 Estrogen receptor negative status [ER-]: Secondary | ICD-10-CM | POA: Diagnosis not present

## 2019-09-08 DIAGNOSIS — Z79899 Other long term (current) drug therapy: Secondary | ICD-10-CM | POA: Insufficient documentation

## 2019-09-08 DIAGNOSIS — M1990 Unspecified osteoarthritis, unspecified site: Secondary | ICD-10-CM | POA: Insufficient documentation

## 2019-09-08 DIAGNOSIS — M329 Systemic lupus erythematosus, unspecified: Secondary | ICD-10-CM | POA: Insufficient documentation

## 2019-09-08 DIAGNOSIS — Z853 Personal history of malignant neoplasm of breast: Secondary | ICD-10-CM | POA: Diagnosis not present

## 2019-09-08 DIAGNOSIS — K219 Gastro-esophageal reflux disease without esophagitis: Secondary | ICD-10-CM | POA: Diagnosis not present

## 2019-09-08 DIAGNOSIS — E785 Hyperlipidemia, unspecified: Secondary | ICD-10-CM | POA: Diagnosis not present

## 2019-09-08 DIAGNOSIS — Z9013 Acquired absence of bilateral breasts and nipples: Secondary | ICD-10-CM | POA: Diagnosis not present

## 2019-09-08 DIAGNOSIS — C50412 Malignant neoplasm of upper-outer quadrant of left female breast: Secondary | ICD-10-CM | POA: Diagnosis not present

## 2019-09-08 HISTORY — PX: REMOVAL OF BILATERAL TISSUE EXPANDERS WITH PLACEMENT OF BILATERAL BREAST IMPLANTS: SHX6431

## 2019-09-08 HISTORY — PX: AUGMENTATION MAMMAPLASTY: SUR837

## 2019-09-08 LAB — GLUCOSE, CAPILLARY
Glucose-Capillary: 202 mg/dL — ABNORMAL HIGH (ref 70–99)
Glucose-Capillary: 171 mg/dL — ABNORMAL HIGH (ref 70–99)

## 2019-09-08 SURGERY — REMOVAL, TISSUE EXPANDER, BREAST, BILATERAL, WITH BILATERAL IMPLANT IMPLANT INSERTION
Anesthesia: General | Site: Breast | Laterality: Bilateral

## 2019-09-08 MED ORDER — MIDAZOLAM HCL 2 MG/2ML IJ SOLN
1.0000 mg | INTRAMUSCULAR | Status: DC | PRN
  Administered 2019-09-08: 2 mg via INTRAVENOUS

## 2019-09-08 MED ORDER — PROPOFOL 10 MG/ML IV BOLUS
INTRAVENOUS | Status: AC
  Filled 2019-09-08: qty 20

## 2019-09-08 MED ORDER — ONDANSETRON HCL 4 MG/2ML IJ SOLN
INTRAMUSCULAR | Status: AC
  Filled 2019-09-08: qty 2

## 2019-09-08 MED ORDER — CEFAZOLIN SODIUM-DEXTROSE 2-4 GM/100ML-% IV SOLN
2.0000 g | INTRAVENOUS | Status: AC
  Administered 2019-09-08: 2 g via INTRAVENOUS

## 2019-09-08 MED ORDER — HYDROMORPHONE HCL 1 MG/ML IJ SOLN
0.2500 mg | INTRAMUSCULAR | Status: DC | PRN
  Administered 2019-09-08: 0.5 mg via INTRAVENOUS

## 2019-09-08 MED ORDER — LIDOCAINE 2% (20 MG/ML) 5 ML SYRINGE
INTRAMUSCULAR | Status: AC
  Filled 2019-09-08: qty 15

## 2019-09-08 MED ORDER — ONDANSETRON HCL 4 MG/2ML IJ SOLN
INTRAMUSCULAR | Status: AC
  Filled 2019-09-08: qty 6

## 2019-09-08 MED ORDER — ACETAMINOPHEN 325 MG PO TABS: 650.0000 mg | ORAL_TABLET | ORAL | Status: DC | PRN

## 2019-09-08 MED ORDER — FENTANYL CITRATE (PF) 100 MCG/2ML IJ SOLN
INTRAMUSCULAR | Status: AC
  Filled 2019-09-08: qty 2

## 2019-09-08 MED ORDER — SODIUM CHLORIDE 0.9% FLUSH: 3.0000 mL | INTRAVENOUS | Status: DC | PRN

## 2019-09-08 MED ORDER — CEFAZOLIN SODIUM-DEXTROSE 2-4 GM/100ML-% IV SOLN
INTRAVENOUS | Status: AC
  Filled 2019-09-08: qty 100

## 2019-09-08 MED ORDER — PROMETHAZINE HCL 25 MG/ML IJ SOLN: 6.2500 mg | INTRAMUSCULAR | Status: DC | PRN

## 2019-09-08 MED ORDER — SODIUM CHLORIDE 0.9% FLUSH: 3.0000 mL | Freq: Two times a day (BID) | INTRAVENOUS | Status: DC

## 2019-09-08 MED ORDER — PROPOFOL 10 MG/ML IV BOLUS
INTRAVENOUS | Status: DC | PRN
  Administered 2019-09-08: 150 mg via INTRAVENOUS

## 2019-09-08 MED ORDER — HYDROMORPHONE HCL 1 MG/ML IJ SOLN
INTRAMUSCULAR | Status: AC
  Filled 2019-09-08: qty 0.5

## 2019-09-08 MED ORDER — OXYCODONE HCL 5 MG PO TABS: 5.0000 mg | ORAL_TABLET | ORAL | Status: DC | PRN

## 2019-09-08 MED ORDER — ONDANSETRON HCL 4 MG/2ML IJ SOLN
INTRAMUSCULAR | Status: DC | PRN
  Administered 2019-09-08: 4 mg via INTRAVENOUS

## 2019-09-08 MED ORDER — SCOPOLAMINE 1 MG/3DAYS TD PT72
1.0000 | MEDICATED_PATCH | TRANSDERMAL | Status: DC
  Administered 2019-09-08: 1.5 mg via TRANSDERMAL

## 2019-09-08 MED ORDER — FENTANYL CITRATE (PF) 100 MCG/2ML IJ SOLN
INTRAMUSCULAR | Status: DC | PRN
  Administered 2019-09-08 (×2): 25 ug via INTRAVENOUS
  Administered 2019-09-08: 50 ug via INTRAVENOUS
  Administered 2019-09-08: 25 ug via INTRAVENOUS

## 2019-09-08 MED ORDER — LIDOCAINE-EPINEPHRINE 1 %-1:100000 IJ SOLN
INTRAMUSCULAR | Status: DC | PRN
  Administered 2019-09-08: 15 mL

## 2019-09-08 MED ORDER — FENTANYL CITRATE (PF) 100 MCG/2ML IJ SOLN: 12.5000 ug | INTRAMUSCULAR | Status: DC | PRN

## 2019-09-08 MED ORDER — ACETAMINOPHEN 500 MG PO TABS
1000.0000 mg | ORAL_TABLET | Freq: Once | ORAL | Status: AC
  Administered 2019-09-08: 1000 mg via ORAL

## 2019-09-08 MED ORDER — PHENYLEPHRINE HCL (PRESSORS) 10 MG/ML IV SOLN
INTRAVENOUS | Status: DC | PRN
  Administered 2019-09-08 (×3): 80 ug via INTRAVENOUS

## 2019-09-08 MED ORDER — ACETAMINOPHEN 650 MG RE SUPP: 650.0000 mg | RECTAL | Status: DC | PRN

## 2019-09-08 MED ORDER — FENTANYL CITRATE (PF) 100 MCG/2ML IJ SOLN: 50.0000 ug | INTRAMUSCULAR | Status: DC | PRN

## 2019-09-08 MED ORDER — LACTATED RINGERS IV SOLN: INTRAVENOUS | Status: DC

## 2019-09-08 MED ORDER — SCOPOLAMINE 1 MG/3DAYS TD PT72
MEDICATED_PATCH | TRANSDERMAL | Status: AC
  Filled 2019-09-08: qty 1

## 2019-09-08 MED ORDER — LIDOCAINE 2% (20 MG/ML) 5 ML SYRINGE: INTRAMUSCULAR | Status: DC | PRN

## 2019-09-08 MED ORDER — OXYCODONE HCL 5 MG PO TABS: 5.0000 mg | ORAL_TABLET | Freq: Once | ORAL | Status: DC | PRN

## 2019-09-08 MED ORDER — PHENYLEPHRINE 40 MCG/ML (10ML) SYRINGE FOR IV PUSH (FOR BLOOD PRESSURE SUPPORT)
PREFILLED_SYRINGE | INTRAVENOUS | Status: AC
  Filled 2019-09-08: qty 10

## 2019-09-08 MED ORDER — SODIUM CHLORIDE 0.9 % IV SOLN: 250.0000 mL | INTRAVENOUS | Status: DC | PRN

## 2019-09-08 MED ORDER — DEXAMETHASONE SODIUM PHOSPHATE 4 MG/ML IJ SOLN
INTRAMUSCULAR | Status: DC | PRN
  Administered 2019-09-08: 10 mg via INTRAVENOUS

## 2019-09-08 MED ORDER — DEXAMETHASONE SODIUM PHOSPHATE 10 MG/ML IJ SOLN
INTRAMUSCULAR | Status: AC
  Filled 2019-09-08: qty 1

## 2019-09-08 MED ORDER — LIDOCAINE HCL (CARDIAC) PF 100 MG/5ML IV SOSY
PREFILLED_SYRINGE | INTRAVENOUS | Status: DC | PRN
  Administered 2019-09-08: 60 mg via INTRAVENOUS

## 2019-09-08 MED ORDER — SODIUM CHLORIDE 0.9 % IV SOLN
INTRAVENOUS | Status: DC | PRN
  Administered 2019-09-08: 500 mL

## 2019-09-08 MED ORDER — CHLORHEXIDINE GLUCONATE CLOTH 2 % EX PADS: 6.0000 | MEDICATED_PAD | Freq: Once | CUTANEOUS | Status: DC

## 2019-09-08 MED ORDER — MEPERIDINE HCL 25 MG/ML IJ SOLN: 6.2500 mg | INTRAMUSCULAR | Status: DC | PRN

## 2019-09-08 MED ORDER — ACETAMINOPHEN 500 MG PO TABS
ORAL_TABLET | ORAL | Status: AC
  Filled 2019-09-08: qty 2

## 2019-09-08 MED ORDER — OXYCODONE HCL 5 MG/5ML PO SOLN: 5.0000 mg | Freq: Once | ORAL | Status: DC | PRN

## 2019-09-08 MED ORDER — SCOPOLAMINE 1 MG/3DAYS TD PT72
MEDICATED_PATCH | TRANSDERMAL | Status: DC | PRN
  Administered 2019-09-08: 1 via TRANSDERMAL

## 2019-09-08 MED ORDER — MIDAZOLAM HCL 2 MG/2ML IJ SOLN
INTRAMUSCULAR | Status: AC
  Filled 2019-09-08: qty 2

## 2019-09-08 SURGICAL SUPPLY — 68 items
BAG DECANTER FOR FLEXI CONT (MISCELLANEOUS) ×2 IMPLANT
BINDER BREAST LRG (GAUZE/BANDAGES/DRESSINGS) IMPLANT
BINDER BREAST MEDIUM (GAUZE/BANDAGES/DRESSINGS) IMPLANT
BINDER BREAST XLRG (GAUZE/BANDAGES/DRESSINGS) ×2 IMPLANT
BINDER BREAST XXLRG (GAUZE/BANDAGES/DRESSINGS) IMPLANT
BIOPATCH RED 1 DISK 7.0 (GAUZE/BANDAGES/DRESSINGS) IMPLANT
BLADE HEX COATED 2.75 (ELECTRODE) ×2 IMPLANT
BLADE SURG 15 STRL LF DISP TIS (BLADE) ×2 IMPLANT
BLADE SURG 15 STRL SS (BLADE) ×2
CANISTER SUCT 1200ML W/VALVE (MISCELLANEOUS) ×2 IMPLANT
CORD BIPOLAR FORCEPS 12FT (ELECTRODE) IMPLANT
COVER BACK TABLE REUSABLE LG (DRAPES) ×2 IMPLANT
COVER MAYO STAND REUSABLE (DRAPES) ×2 IMPLANT
COVER WAND RF STERILE (DRAPES) IMPLANT
DECANTER SPIKE VIAL GLASS SM (MISCELLANEOUS) IMPLANT
DERMABOND ADVANCED (GAUZE/BANDAGES/DRESSINGS)
DERMABOND ADVANCED .7 DNX12 (GAUZE/BANDAGES/DRESSINGS) IMPLANT
DRAIN CHANNEL 19F RND (DRAIN) IMPLANT
DRAPE LAPAROSCOPIC ABDOMINAL (DRAPES) ×2 IMPLANT
DRSG PAD ABDOMINAL 8X10 ST (GAUZE/BANDAGES/DRESSINGS) ×4 IMPLANT
ELECT BLADE 4.0 EZ CLEAN MEGAD (MISCELLANEOUS) ×2
ELECT REM PT RETURN 9FT ADLT (ELECTROSURGICAL) ×2
ELECTRODE BLDE 4.0 EZ CLN MEGD (MISCELLANEOUS) ×1 IMPLANT
ELECTRODE REM PT RTRN 9FT ADLT (ELECTROSURGICAL) ×1 IMPLANT
EVACUATOR SILICONE 100CC (DRAIN) IMPLANT
GAUZE SPONGE 4X4 12PLY STRL LF (GAUZE/BANDAGES/DRESSINGS) IMPLANT
GLOVE BIO SURGEON STRL SZ 6.5 (GLOVE) ×6 IMPLANT
GLOVE BIO SURGEON STRL SZ7 (GLOVE) ×6 IMPLANT
GLOVE BIOGEL PI IND STRL 6.5 (GLOVE) ×1 IMPLANT
GLOVE BIOGEL PI IND STRL 7.0 (GLOVE) ×1 IMPLANT
GLOVE BIOGEL PI INDICATOR 6.5 (GLOVE) ×1
GLOVE BIOGEL PI INDICATOR 7.0 (GLOVE) ×1
GLOVE ECLIPSE 6.5 STRL STRAW (GLOVE) ×2 IMPLANT
GOWN STRL REUS W/ TWL LRG LVL3 (GOWN DISPOSABLE) ×2 IMPLANT
GOWN STRL REUS W/TWL LRG LVL3 (GOWN DISPOSABLE) ×2
IMPL BREAST GEL 380CC (Breast) ×2 IMPLANT
IMPLANT BREAST GEL 380CC (Breast) ×4 IMPLANT
IV NS 1000ML (IV SOLUTION)
IV NS 1000ML BAXH (IV SOLUTION) IMPLANT
IV NS 500ML (IV SOLUTION)
IV NS 500ML BAXH (IV SOLUTION) IMPLANT
KIT FILL SYSTEM UNIVERSAL (SET/KITS/TRAYS/PACK) IMPLANT
NDL SAFETY ECLIPSE 18X1.5 (NEEDLE) ×1 IMPLANT
NEEDLE HYPO 18GX1.5 SHARP (NEEDLE) ×1
NEEDLE HYPO 25X1 1.5 SAFETY (NEEDLE) ×2 IMPLANT
PACK BASIN DAY SURGERY FS (CUSTOM PROCEDURE TRAY) ×2 IMPLANT
PENCIL SMOKE EVACUATOR (MISCELLANEOUS) ×2 IMPLANT
PIN SAFETY STERILE (MISCELLANEOUS) IMPLANT
SIZER BREAST REUSE 380CC (SIZER) ×2
SIZER BRST REUSE 380CC (SIZER) ×1 IMPLANT
SLEEVE SCD COMPRESS KNEE MED (MISCELLANEOUS) ×2 IMPLANT
SPONGE LAP 18X18 RF (DISPOSABLE) ×4 IMPLANT
STRIP SUTURE WOUND CLOSURE 1/2 (MISCELLANEOUS) IMPLANT
SUT MNCRL AB 4-0 PS2 18 (SUTURE) ×4 IMPLANT
SUT MON AB 3-0 SH 27 (SUTURE) ×4
SUT MON AB 3-0 SH27 (SUTURE) ×4 IMPLANT
SUT MON AB 5-0 PS2 18 (SUTURE) ×2 IMPLANT
SUT PDS AB 2-0 CT2 27 (SUTURE) IMPLANT
SUT VIC AB 3-0 SH 27 (SUTURE)
SUT VIC AB 3-0 SH 27X BRD (SUTURE) IMPLANT
SUT VICRYL 4-0 PS2 18IN ABS (SUTURE) IMPLANT
SYR BULB IRRIGATION 50ML (SYRINGE) ×2 IMPLANT
SYR CONTROL 10ML LL (SYRINGE) ×2 IMPLANT
TOWEL GREEN STERILE FF (TOWEL DISPOSABLE) ×4 IMPLANT
TRAY DSU PREP LF (CUSTOM PROCEDURE TRAY) ×2 IMPLANT
TUBE CONNECTING 20X1/4 (TUBING) ×2 IMPLANT
UNDERPAD 30X36 HEAVY ABSORB (UNDERPADS AND DIAPERS) ×4 IMPLANT
YANKAUER SUCT BULB TIP NO VENT (SUCTIONS) ×2 IMPLANT

## 2019-09-08 NOTE — Transfer of Care (Signed)
Immediate Anesthesia Transfer of Care Note  Patient: Miranda Greene  Procedure(s) Performed: REMOVAL OF BILATERAL TISSUE EXPANDERS WITH PLACEMENT OF BILATERAL BREAST IMPLANTS (Bilateral Breast)  Patient Location: PACU  Anesthesia Type:General  Level of Consciousness: awake and patient cooperative  Airway & Oxygen Therapy: Patient Spontanous Breathing and Patient connected to face mask oxygen  Post-op Assessment: Report given to RN and Post -op Vital signs reviewed and stable  Post vital signs: Reviewed and stable  Last Vitals:  Vitals Value Taken Time  BP    Temp    Pulse 87 09/08/19 0927  Resp    SpO2 100 % 09/08/19 0927  Vitals shown include unvalidated device data.  Last Pain:  Vitals:   09/08/19 [id number]  TempSrc: Temporal  PainSc: 0-No pain         Complications: No apparent anesthesia complications

## 2019-09-08 NOTE — Anesthesia Procedure Notes (Signed)
Procedure Name: LMA Insertion Date/Time: 09/08/2019 7:39 AM Performed by: Marrianne Mood, CRNA Pre-anesthesia Checklist: Patient identified, Emergency Drugs available, Suction available, Patient being monitored and Timeout performed Patient Re-evaluated:Patient Re-evaluated prior to induction Oxygen Delivery Method: Circle system utilized Preoxygenation: Pre-oxygenation with 100% oxygen Induction Type: IV induction Ventilation: Mask ventilation without difficulty LMA: LMA inserted LMA Size: 4.0 Number of attempts: 1 Airway Equipment and Method: Bite block Placement Confirmation: positive ETCO2 Tube secured with: Tape Dental Injury: Teeth and Oropharynx as per pre-operative assessment

## 2019-09-08 NOTE — Interval H&P Note (Signed)
History and Physical Interval Note:  09/08/2019 7:09 AM  Miranda Greene  has presented today for surgery, with the diagnosis of status post breast reconstruction.  The various methods of treatment have been discussed with the patient and family. After consideration of risks, benefits and other options for treatment, the patient has consented to  Procedure(s): REMOVAL OF BILATERAL TISSUE EXPANDERS WITH PLACEMENT OF BILATERAL BREAST IMPLANTS (Bilateral) as a surgical intervention.  The patient's history has been reviewed, patient examined, no change in status, stable for surgery.  I have reviewed the patient's chart and labs.  Questions were answered to the patient's satisfaction.     Loel Lofty Roark Rufo

## 2019-09-08 NOTE — Addendum Note (Signed)
Addendum  created 09/08/19 1147 by Marrianne Mood, CRNA   Charge Capture section accepted

## 2019-09-08 NOTE — Discharge Instructions (Signed)
INSTRUCTIONS FOR AFTER SURGERY   You will likely have some questions about what to expect following your operation.  The following information will help you and your family understand what to expect when you are discharged from the hospital.  Following these guidelines will help ensure a smooth recovery and reduce risks of complications.  Postoperative instructions include information on: diet, wound care, medications and physical activity.  AFTER SURGERY Expect to go home after the procedure.  In some cases, you may need to spend one night in the hospital for observation.  DIET This surgery does not require a specific diet.  However, I have to mention that the healthier you eat the better your body can start healing. It is important to increasing your protein intake.  This means limiting the foods with added sugar.  Focus on fruits and vegetables and some meat.  If you have any liposuction during your procedure be sure to drink water.  If your urine is bright yellow, then it is concentrated, and you need to drink more water.  As a general rule after surgery, you should have 8 ounces of water every hour while awake.  If you find you are persistently nauseated or unable to take in liquids let us know.  NO TOBACCO USE or EXPOSURE.  This will slow your healing process and increase the risk of a wound.  WOUND CARE If you don't have a drain: You can shower the day after surgery.  Use fragrance free soap.  Dial, Dove, Ivory and Cetaphil are usually mild on the skin.  If you have a drain: Clean with baby wipes until the drain is removed.   If you have steri-strips / tape directly attached to your skin leave them in place. It is OK to get these wet.  No baths, pools or hot tubs for two weeks. We close your incision to leave the smallest and best-looking scar. No ointment or creams on your incisions until given the go ahead.  Especially not Neosporin (Too many skin reactions with this one).  A few weeks after  surgery you can use Mederma and start massaging the scar. We ask you to wear your binder or sports bra for the first 6 weeks around the clock, including while sleeping. This provides added comfort and helps reduce the fluid accumulation at the surgery site.  ACTIVITY No heavy lifting until cleared by the doctor.  It is OK to walk and climb stairs. In fact, moving your legs is very important to decrease your risk of a blood clot.  It will also help keep you from getting deconditioned.  Every 1 to 2 hours get up and walk for 5 minutes. This will help with a quicker recovery back to normal.  Let pain be your guide so you don't do too much.  NO, you cannot do the spring cleaning and don't plan on taking care of anyone else.  This is your time for TLC.   WORK Everyone returns to work at different times. As a rough guide, most people take at least 1 - 2 weeks off prior to returning to work. If you need documentation for your job, bring the forms to your postoperative follow up visit.  DRIVING Arrange for someone to bring you home from the hospital.  You may be able to drive a few days after surgery but not while taking any narcotics or valium.  BOWEL MOVEMENTS Constipation can occur after anesthesia and while taking pain medication.  It is important   to stay ahead for your comfort.  We recommend taking Milk of Magnesia (2 tablespoons; twice a day) while taking the pain pills.  SEROMA This is fluid your body tried to put in the surgical site.  This is normal but if it creates excessive pain and swelling let us know.  It usually decreases in a few weeks.  MEDICATIONS and PAIN CONTROL At your preoperative visit for you history and physical you were given the following medications: 1. An antibiotic: Start this medication when you get home and take according to the instructions on the bottle. 2. Zofran 4 mg:  This is to treat nausea and vomiting.  You can take this every 6 hours as needed and only if  needed. 3. Norco (hydrocodone/acetaminophen) 5/325 mg:  This is only to be used after you have taken the motrin or the tylenol. Every 8 hours as needed. Over the counter Medication to take: 4. Ibuprofen (Motrin) 600 mg:  Take this every 6 hours.  If you have additional pain then take 500 mg of the tylenol.  Only take the Norco after you have tried these two. 5. Miralax or stool softener of choice: Take this according to the bottle if you take the [place] Call your surgeon's office if any of the following occur:  Fever 101 degrees F or greater  Excessive bleeding or fluid from the incision site.  Pain that increases over time without aid from the medications  Redness, warmth, or pus draining from incision sites  Persistent nausea or inability to take in liquids  Severe misshapen area that underwent the operation.    Post Anesthesia Home Care Instructions  Activity: Get plenty of rest for the remainder of the day. A responsible individual must stay with you for 24 hours following the procedure.  For the next 24 hours, DO NOT: -Drive a car -Paediatric nurse -Drink alcoholic beverages -Take any medication unless instructed by your physician -Make any legal decisions or sign important papers.  Meals: Start with liquid foods such as gelatin or soup. Progress to regular foods as tolerated. Avoid greasy, spicy, heavy foods. If nausea and/or vomiting occur, drink only clear liquids until the nausea and/or vomiting subsides. Call your physician if vomiting continues.  Special Instructions/Symptoms: Your throat may feel dry or sore from the anesthesia or the breathing tube placed in your throat during surgery. If this causes discomfort, gargle with warm salt water. The discomfort should disappear within 24 hours.  If you had a scopolamine patch placed behind your ear for the management of post- operative nausea and/or vomiting:  1. The medication in the patch is effective  for 72 hours, after which it should be removed.  Wrap patch in a tissue and discard in the trash. Wash hands thoroughly with soap and water. 2. You may remove the patch earlier than 72 hours if you experience unpleasant side effects which may include dry mouth, dizziness or visual disturbances. 3. Avoid touching the patch. Wash your hands with soap and water after contact with the patch.   No tylenol until after 1pm today.

## 2019-09-08 NOTE — Op Note (Signed)
Op report Bilateral Exchange   DATE OF OPERATION: 09/08/2019  LOCATION: [place]  SURGICAL DIVISION: Plastic Surgery  PREOPERATIVE DIAGNOSES:  1.History of breast cancer.  2. Acquired absence of bilateral breast.   POSTOPERATIVE DIAGNOSES:  1. History of breast cancer.  2. Acquired absence of bilateral breast.   PROCEDURE:  1. Bilateral exchange of tissue expanders for implants.  2. Bilateral extensive capsulotomies for implant respositioning.  SURGEON: Fardeen Steinberger Sanger Arnika Larzelere, DO  ASSISTANT: Phoebe Sharps, PA  ANESTHESIA:  General.   COMPLICATIONS: None.   IMPLANTS: Left - Mentor Smooth Round High Profile Xtra Gel 380 cc. Ref #[id number].  Serial Number [id number] Right - Mentor Smooth Round High Profile Xtra Gel 380 cc. Ref #[id number].  Serial Number [id number]  INDICATIONS FOR PROCEDURE:  The patient, Miranda Greene, is a 63 y.o. female born on [birth year], is here for treatment after bilateral mastectomies.  She had tissue expanders placed at the time of mastectomies. She now presents for exchange of her expanders for implants.  She requires capsulotomies to better position the implants. MRN: [id number]  CONSENT:  Informed consent was obtained directly from the patient. Risks, benefits and alternatives were fully discussed. Specific risks including but not limited to bleeding, infection, hematoma, seroma, scarring, pain, implant infection, implant extrusion, capsular contracture, asymmetry, wound healing problems, and need for further surgery were all discussed. The patient did have an ample opportunity to have her questions answered to her satisfaction.   DESCRIPTION OF PROCEDURE:  The patient was taken to the operating room. SCDs were placed and IV antibiotics were given. The patient's chest was prepped and draped in a sterile fashion. A time out was performed and the implants to be used were identified.    On the right breast: One percent  Lidocaine with epinephrine was used to infiltrate at the incision site. The old mastectomy scar was excised.  The mastectomy flaps from the superior and inferior flaps were raised over the pectoralis major muscle for several centimeters to minimize tension for the closure. The pectoralis was split inferior to the skin incision to expose and remove the tissue expander.  Inspection of the pocket showed a normal healthy capsule and good integration of the biologic matrix.  The pocket was irrigated with antibiotic solution.  Circumferential capsulotomies were performed to allow for breast pocket expansion.  Measurements were made and a sizer used to confirm adequate pocket size for the implant dimensions.  Hemostasis was ensured with electrocautery. New gloves were placed. The implant was soaked in antibiotic solution and then placed in the pocket and oriented appropriately. The pectoralis major muscle and capsule on the anterior surface were re-closed with a 3-0 Monocryl suture. The remaining skin was closed with 4-0 Monocryl deep dermal and 5-0 Monocryl subcuticular stitches.   On the left breast: The old mastectomy scar was excised.  The mastectomy flaps from the superior and inferior flaps were raised over the pectoralis major muscle for several centimeters to minimize tension for the closure. The pectoralis was split inferior to the skin incision to expose and remove the tissue expander.  Inspection of the pocket showed a normal healthy capsule and good integration of the biologic matrix.   Circumferential capsulotomies were performed to allow for breast pocket expansion.  Measurements were made and a sizer utilized to confirm adequate pocket size for the implant dimensions.  Hemostasis was ensured with the electrocautery.  New gloves were applied. The implant was soaked in antibiotic solution and placed in the pocket  and oriented appropriately. The pectoralis major muscle and capsule on the anterior surface  were re-closed with a 3-0 Monocryl suture. The remaining skin was closed with 4-0 Monocryl deep dermal and 5-0 Monocryl subcuticular stitches.  Dermabond was applied to the incision site. A breast binder and ABDs were placed.  The patient was allowed to wake from anesthesia and taken to the recovery room in satisfactory condition.   The advanced practice practitioner (APP) assisted throughout the case.  The APP was essential in retraction and counter traction when needed to make the case progress smoothly.  This retraction and assistance made it possible to see the tissue plans for the procedure.  The assistance was needed for blood control, tissue re-approximation and assisted with closure of the incision site.  The [place] was signed into law in 2016 which includes the topic of electronic health records.  This provides immediate access to information in MyChart.  This includes consultation notes, operative notes, office notes, lab results and pathology reports.  If you have any questions about what you read please let us know.  We are right here with you.

## 2019-09-08 NOTE — Anesthesia Postprocedure Evaluation (Signed)
Anesthesia Post Note  Patient: Miranda Greene  Procedure(s) Performed: REMOVAL OF BILATERAL TISSUE EXPANDERS WITH PLACEMENT OF BILATERAL BREAST IMPLANTS (Bilateral Breast)     Patient location during evaluation: PACU Anesthesia Type: General Level of consciousness: awake and alert, oriented and patient cooperative Pain management: pain level controlled Vital Signs Assessment: post-procedure vital signs reviewed and stable Respiratory status: spontaneous breathing, nonlabored ventilation and respiratory function stable Cardiovascular status: blood pressure returned to baseline and stable Postop Assessment: no apparent nausea or vomiting Anesthetic complications: no    Last Vitals:  Vitals:   09/08/19 0927 09/08/19 0930  BP: 136/74 138/76  Pulse: 87 81  Resp:  11  Temp:  36.5 C  SpO2: 100% 100%    Last Pain:  Vitals:   09/08/19 0930  TempSrc:   PainSc: [place]

## 2019-09-13 ENCOUNTER — Telehealth: Payer: Self-pay | Admitting: Plastic Surgery

## 2019-09-13 NOTE — Telephone Encounter (Signed)

## 2019-09-14 ENCOUNTER — Encounter: Payer: Self-pay | Admitting: *Deleted

## 2019-09-14 ENCOUNTER — Encounter: Payer: Self-pay | Admitting: Plastic Surgery

## 2019-09-14 ENCOUNTER — Ambulatory Visit (INDEPENDENT_AMBULATORY_CARE_PROVIDER_SITE_OTHER): Payer: BC Managed Care – PPO | Admitting: Plastic Surgery

## 2019-09-14 ENCOUNTER — Other Ambulatory Visit: Payer: Self-pay

## 2019-09-14 VITALS — BP 131/83 | HR 87 | Temp 97.7°F | Ht 63.0 in | Wt 139.8 lb

## 2019-09-14 DIAGNOSIS — Z9889 Other specified postprocedural states: Secondary | ICD-10-CM

## 2019-09-14 DIAGNOSIS — D0512 Intraductal carcinoma in situ of left breast: Secondary | ICD-10-CM

## 2019-09-14 NOTE — Progress Notes (Signed)
   Subjective:     Patient ID: Miranda Greene, female    DOB: July 21, 1956, 64 y.o.   MRN: [id number]  Chief Complaint  Patient presents with  . Post-op Follow-up    SX: 09/08/19 BL removal of expander with placement of implants    HPI: The patient is a 64 y.o. female here for follow-up after bilateral exchange of tissue expanders for implants on 09/08/2019 with Dr. Marla Roe.  Miranda Greene reports she is doing very well.  Reports minimal pain. No other complaints about her breasts.  She believes she may have a bladder infection.  Pain is currently controlled on Tylenol.  Denies fevers, redness, drainage, swelling of breasts. +BM.  Review of Systems  Constitutional: Negative for chills and fever.  Respiratory: Negative for shortness of breath.   Cardiovascular: Negative for chest pain and leg swelling.  Gastrointestinal: Negative for constipation, diarrhea, nausea and vomiting.  Skin: Negative for color change, pallor and rash.       Denies drainage     Objective:   Vital Signs BP 131/83 (BP Location: Left Arm, Patient Position: Sitting, Cuff Size: Normal)   Pulse 87   Temp 97.7 F (36.5 C) (Temporal)   Ht 5\' 3"  (1.6 m)   Wt 139 lb 12.8 oz (63.4 kg)   SpO2 97%   BMI 24.76 kg/m  Vital Signs and Nursing Note Reviewed  Physical Exam  Constitutional: She is oriented to person, place, and time and well-developed, well-nourished, and in no distress.  HENT:  Head: Normocephalic and atraumatic.  Eyes: EOM are normal.  Cardiovascular: Normal rate.  Pulmonary/Chest: Effort normal.    Incisions healing well, c/d/i. Steristrips in place bilaterally. No signs of infection, redness, drainage, seroma/hematoma or dehiscence.  Musculoskeletal:        General: Normal range of motion.     Cervical back: Normal range of motion.  Neurological: She is alert and oriented to person, place, and time. Gait normal.  Skin: Skin is warm and dry. No rash noted. No erythema. No pallor.    Psychiatric: Mood, memory, affect and judgment normal.      Assessment/Plan:     ICD-10-CM   1. S/P breast reconstruction, bilateral  Z98.890   2. Ductal carcinoma in situ (DCIS) of left breast  D05.12     Miranda Greene is doing very well.  She is very pleased with her results. Incisions are c/d/i. Steristrips in place. No redness, drainage, seroma/hematoma, or signs of infection.   Continue to wear sports bra 24/7. May shower normally. Steristrips will begin to fall off on their own (~2-3 weeks).  [place] when they do.   Call office with any questions or concerns. Next follow up appointment 09/21/2019.   Threasa Heads, PA-C 09/14/2019, 9:45 AM

## 2019-09-20 ENCOUNTER — Telehealth: Payer: Self-pay

## 2019-09-20 NOTE — Telephone Encounter (Signed)

## 2019-09-21 ENCOUNTER — Encounter: Payer: Self-pay | Admitting: Plastic Surgery

## 2019-09-21 ENCOUNTER — Ambulatory Visit (INDEPENDENT_AMBULATORY_CARE_PROVIDER_SITE_OTHER): Payer: BC Managed Care – PPO | Admitting: Plastic Surgery

## 2019-09-21 ENCOUNTER — Other Ambulatory Visit: Payer: Self-pay

## 2019-09-21 VITALS — BP 125/79 | HR 89 | Temp 97.9°F | Ht 63.0 in | Wt 137.0 lb

## 2019-09-21 DIAGNOSIS — Z9889 Other specified postprocedural states: Secondary | ICD-10-CM

## 2019-09-21 DIAGNOSIS — D0512 Intraductal carcinoma in situ of left breast: Secondary | ICD-10-CM

## 2019-09-21 NOTE — Progress Notes (Signed)
   Subjective:     Patient ID: Miranda Greene, female    DOB: March 16, 1956, 64 y.o.   MRN: [id number]  No chief complaint on file.   HPI: The patient is a 64 y.o. female here for follow-up after bilateral exchange of tissue expanders for implants on 09/08/2019 with Dr. Marla Roe.  Miranda Greene reports she is doing very well, no complaints. Denies fevers, redness, drainage, or signs of infection of breasts. She is pleased with her results.   Review of Systems  Constitutional: Negative for chills and fever.  Respiratory: Negative for shortness of breath.   Cardiovascular: Negative for chest pain.  Skin: Negative for color change, pallor and rash.       Denies redness, drainage, or signs of infection.     Objective:   Vital Signs BP 125/79 (BP Location: Left Arm, Patient Position: Sitting, Cuff Size: Normal)   Pulse 89   Temp 97.9 F (36.6 C) (Temporal)   Ht 5\' 3"  (1.6 m)   Wt 137 lb (62.1 kg)   SpO2 96%   BMI 24.27 kg/m  Vital Signs and Nursing Note Reviewed  Physical Exam  Constitutional: She is oriented to person, place, and time and well-developed, well-nourished, and in no distress.  HENT:  Head: Normocephalic and atraumatic.  Eyes: EOM are normal.  Pulmonary/Chest: Effort normal.    Incisions healing very well, c/d/i. Steristrips in place bilaterally. No redness, drainage, or signs of infection.   Musculoskeletal:        General: Normal range of motion.     Cervical back: Normal range of motion.  Neurological: She is alert and oriented to person, place, and time. Gait normal.  Skin: Skin is warm and dry. No rash noted. No erythema. No pallor.  Psychiatric: Mood, memory, affect and judgment normal.      Assessment/Plan:     ICD-10-CM   1. S/P breast reconstruction, bilateral  Z98.890   2. Ductal carcinoma in situ (DCIS) of left breast  D05.12     Miranda Greene is doing very well.  Incisions healing nicely, c/d/i. No signs of infection, redness, or drainage.  Steristrips in place, she wants to let them fall off on their own.    Continue wearing sports bra 24/7 until 6 weeks post op.  May shower, avoiding direct water spray on incisions, pat dry.  Call office with any questions of concerns.  The [place] was signed into law in 2016 which includes the topic of electronic health records.  This provides immediate access to information in MyChart.  This includes consultation notes, operative notes, office notes, lab results and pathology reports.  If you have any questions about what you read please let us know at your next visit or call us at the office.  We are right here with you.     Threasa Heads, PA-C 09/21/2019, 8:55 AM

## 2019-10-18 DIAGNOSIS — C50912 Malignant neoplasm of unspecified site of left female breast: Secondary | ICD-10-CM | POA: Diagnosis not present

## 2019-10-18 DIAGNOSIS — C50911 Malignant neoplasm of unspecified site of right female breast: Secondary | ICD-10-CM | POA: Diagnosis not present

## 2019-10-27 ENCOUNTER — Telehealth: Payer: Self-pay | Admitting: *Deleted

## 2019-10-27 NOTE — Telephone Encounter (Signed)
Received DME Standard Written Order on (10/21/19) via of fax  from [facility] [employer].  Requesting signature from provider.  Given to Dr. Marla Roe to sign.   Written order signed and faxed to [facility] [place].  Confirmation received.//AB/CMA

## 2019-11-02 ENCOUNTER — Other Ambulatory Visit: Payer: Self-pay

## 2019-11-02 ENCOUNTER — Ambulatory Visit (INDEPENDENT_AMBULATORY_CARE_PROVIDER_SITE_OTHER): Payer: BC Managed Care – PPO | Admitting: Plastic Surgery

## 2019-11-02 ENCOUNTER — Encounter: Payer: Self-pay | Admitting: Plastic Surgery

## 2019-11-02 VITALS — BP 138/83 | HR 83 | Temp 96.8°F | Ht 63.0 in | Wt 140.0 lb

## 2019-11-02 DIAGNOSIS — Z9889 Other specified postprocedural states: Secondary | ICD-10-CM

## 2019-11-02 DIAGNOSIS — D0512 Intraductal carcinoma in situ of left breast: Secondary | ICD-10-CM

## 2019-11-02 NOTE — Progress Notes (Signed)
   Subjective:    Patient ID: Miranda Greene, female    DOB: 08-19-1956, 64 y.o.   MRN: [id number]  Patient is a very sweet 64 year old white female here for her follow-up from surgery.  In December she underwent exchange of her expanders for implants.  She now has Mentor smooth round high profile Xtra Gel 380 cc implants.  Her incisions are nicely healed.  There is no sign of seroma or hematoma.  All the swelling has resolved.  She is not interested in nipple areola tattoo reconstruction.  She gets a twinge of her left breast once in a while when the muscle contracts but nothing that is causing excessive pain or that does not go away with a little time and massage.  [facility] has [facility] and gotten her bras and prosthetics.  She is feeling really good in close and happy with her results.  She does not have oncology and did not require any radiation.  She is planning to see Dr. Ninfa Linden yearly.     Review of Systems  Constitutional: Negative.   HENT: Negative.   Eyes: Negative.   Respiratory: Negative.   Cardiovascular: Negative.   Gastrointestinal: Negative.   Genitourinary: Negative.   Musculoskeletal: Negative.   Hematological: Negative.   Psychiatric/Behavioral: Negative.        Objective:   Physical Exam Vitals and nursing note reviewed.  Constitutional:      Appearance: Normal appearance.  HENT:     Head: Normocephalic and atraumatic.  Cardiovascular:     Rate and Rhythm: Normal rate.     Pulses: Normal pulses.  Pulmonary:     Effort: Pulmonary effort is normal.  Neurological:     General: No focal deficit present.     Mental Status: She is alert and oriented to person, place, and time. Mental status is at baseline.  Psychiatric:        Mood and Affect: Mood normal.        Behavior: Behavior normal.        Assessment & Plan:     ICD-10-CM   1. S/P breast reconstruction, bilateral  Z98.890   2. Ductal carcinoma in situ (DCIS) of left breast  D05.12     I would like to see the patient yearly as well we can try and work it out to do it around the same time as her general surgery follow-up appointment.  If she has any questions or concerns she knows to give [place] a call.  She is aware of the new guidelines for ultrasound evaluation every 2 to 3 years.  She is very happy with her results and knows to call us with any questions.  Pictures were obtained of the patient and placed in the chart with the patient's or guardian's permission.

## 2020-02-10 ENCOUNTER — Other Ambulatory Visit: Payer: Self-pay | Admitting: Primary Care

## 2020-02-10 DIAGNOSIS — E785 Hyperlipidemia, unspecified: Secondary | ICD-10-CM

## 2020-02-10 DIAGNOSIS — E119 Type 2 diabetes mellitus without complications: Secondary | ICD-10-CM

## 2020-02-25 ENCOUNTER — Other Ambulatory Visit: Payer: Self-pay

## 2020-02-25 ENCOUNTER — Other Ambulatory Visit (INDEPENDENT_AMBULATORY_CARE_PROVIDER_SITE_OTHER): Payer: BC Managed Care – PPO

## 2020-02-25 DIAGNOSIS — E119 Type 2 diabetes mellitus without complications: Secondary | ICD-10-CM

## 2020-02-25 DIAGNOSIS — E785 Hyperlipidemia, unspecified: Secondary | ICD-10-CM

## 2020-02-25 LAB — CBC
HCT: 37.7 % (ref 36.0–46.0)
Hemoglobin: 12.7 g/dL (ref 12.0–15.0)
MCHC: 33.7 g/dL (ref 30.0–36.0)
MCV: 89.5 fl (ref 78.0–100.0)
Platelets: 299 10*3/uL (ref 150.0–400.0)
RBC: 4.21 Mil/uL (ref 3.87–5.11)
RDW: 13.6 % (ref 11.5–15.5)
WBC: 6.3 10*3/uL (ref 4.0–10.5)

## 2020-02-25 LAB — LIPID PANEL
Cholesterol: 159 mg/dL (ref 0–200)
HDL: 37.6 mg/dL — ABNORMAL LOW (ref >39.00)
LDL Cholesterol: 96 mg/dL (ref 0–99)
NonHDL: 120.97
Total CHOL/HDL Ratio: 4
Triglycerides: 123 mg/dL (ref 0.0–149.0)
VLDL: 24.6 mg/dL (ref 0.0–40.0)

## 2020-02-25 LAB — COMPREHENSIVE METABOLIC PANEL
ALT: 10 U/L (ref 0–35)
AST: 13 U/L (ref 0–37)
Albumin: 4.3 g/dL (ref 3.5–5.2)
Alkaline Phosphatase: 71 U/L (ref 39–117)
BUN: 17 mg/dL (ref 6–23)
CO2: 29 mEq/L (ref 19–32)
Calcium: 9.5 mg/dL (ref 8.4–10.5)
Chloride: 104 mEq/L (ref 96–112)
Creatinine, Ser: 0.84 mg/dL (ref 0.40–1.20)
GFR: 68.66 mL/min (ref >60.00)
Glucose, Bld: 82 mg/dL (ref 70–99)
Potassium: 4.1 mEq/L (ref 3.5–5.1)
Sodium: 139 mEq/L (ref 135–145)
Total Bilirubin: 0.5 mg/dL (ref 0.2–1.2)
Total Protein: 7.1 g/dL (ref 6.0–8.3)

## 2020-02-25 LAB — MICROALBUMIN / CREATININE URINE RATIO
Creatinine,U: 170.2 mg/dL
Microalb Creat Ratio: 1.1 mg/g (ref 0.0–30.0)
Microalb, Ur: 2 mg/dL — ABNORMAL HIGH (ref 0.0–1.9)

## 2020-02-25 LAB — HEMOGLOBIN A1C: Hgb A1c MFr Bld: 7.8 % — ABNORMAL HIGH (ref 4.6–6.5)

## 2020-03-01 ENCOUNTER — Encounter: Payer: Self-pay | Admitting: Primary Care

## 2020-03-01 ENCOUNTER — Ambulatory Visit (INDEPENDENT_AMBULATORY_CARE_PROVIDER_SITE_OTHER): Payer: BC Managed Care – PPO | Admitting: Primary Care

## 2020-03-01 ENCOUNTER — Other Ambulatory Visit: Payer: Self-pay

## 2020-03-01 VITALS — BP 134/82 | HR 82 | Temp 97.2°F | Ht 63.0 in | Wt 141.2 lb

## 2020-03-01 DIAGNOSIS — E785 Hyperlipidemia, unspecified: Secondary | ICD-10-CM

## 2020-03-01 DIAGNOSIS — Z0000 Encounter for general adult medical examination without abnormal findings: Secondary | ICD-10-CM | POA: Diagnosis not present

## 2020-03-01 DIAGNOSIS — K219 Gastro-esophageal reflux disease without esophagitis: Secondary | ICD-10-CM | POA: Diagnosis not present

## 2020-03-01 DIAGNOSIS — D0512 Intraductal carcinoma in situ of left breast: Secondary | ICD-10-CM

## 2020-03-01 DIAGNOSIS — R229 Localized swelling, mass and lump, unspecified: Secondary | ICD-10-CM

## 2020-03-01 DIAGNOSIS — E119 Type 2 diabetes mellitus without complications: Secondary | ICD-10-CM

## 2020-03-01 NOTE — Assessment & Plan Note (Signed)
Chronic, no improvement with various treatment. Referral placed to dermatology.

## 2020-03-01 NOTE — Assessment & Plan Note (Signed)
Following with oncology 

## 2020-03-01 NOTE — Assessment & Plan Note (Signed)
Recent A1C of 7.8, she is not checking glucose levels but twice weekly. Strongly advised she check glucose levels daily, rotating times of checks.  Continue Lantus at 20 units. Continue metformin. Urine microalbumin negative. Declines statin therapy. Pneumonia vaccination UTD. Foot exam next visit.  Follow up in 3 months.

## 2020-03-01 NOTE — Assessment & Plan Note (Signed)
Declines Shingrix, other immunizations UTD. Colon cancer screening due this year, discussed to consider colonoscopy given breast cancer history. Breast evaluation due this Fall. Discussed the importance of a healthy diet and regular exercise in order for weight loss, and to reduce the risk of any potential medical problems.  Exam today stable. Labs reviewed.

## 2020-03-01 NOTE — Patient Instructions (Signed)
Start exercising. You should be getting 150 minutes of moderate intensity exercise weekly.  Continue to work on a healthy diet. Ensure you are consuming 64 ounces of water daily.  Consider the colonoscopy and Shingles vaccines.  Please schedule a follow up appointment in 3 months for diabetes check.  It was a pleasure to see you today!   Preventive Care 88-62 Years Old, Female Preventive care refers to visits with your health care provider and lifestyle choices that can promote health and wellness. This includes:  A yearly physical exam. This may also be called an annual well check.  Regular dental visits and eye exams.  Immunizations.  Screening for certain conditions.  Healthy lifestyle choices, such as eating a healthy diet, getting regular exercise, not using drugs or products that contain nicotine and tobacco, and limiting alcohol use. What can I expect for my preventive care visit? Physical exam Your health care provider will check your:  Height and weight. This may be used to calculate body mass index (BMI), which tells if you are at a healthy weight.  Heart rate and blood pressure.  Skin for abnormal spots. Counseling Your health care provider may ask you questions about your:  Alcohol, tobacco, and drug use.  Emotional well-being.  Home and relationship well-being.  Sexual activity.  Eating habits.  Work and work Statistician.  Method of birth control.  Menstrual cycle.  Pregnancy history. What immunizations do I need?  Influenza (flu) vaccine  This is recommended every year. Tetanus, diphtheria, and pertussis (Tdap) vaccine  You may need a Td booster every 10 years. Varicella (chickenpox) vaccine  You may need this if you have not been vaccinated. Zoster (shingles) vaccine  You may need this after age 2. Measles, mumps, and rubella (MMR) vaccine  You may need at least one dose of MMR if you were born in 1957 or later. You may also need a  second dose. Pneumococcal conjugate (PCV13) vaccine  You may need this if you have certain conditions and were not previously vaccinated. Pneumococcal polysaccharide (PPSV23) vaccine  You may need one or two doses if you smoke cigarettes or if you have certain conditions. Meningococcal conjugate (MenACWY) vaccine  You may need this if you have certain conditions. Hepatitis A vaccine  You may need this if you have certain conditions or if you travel or work in places where you may be exposed to hepatitis A. Hepatitis B vaccine  You may need this if you have certain conditions or if you travel or work in places where you may be exposed to hepatitis B. Haemophilus influenzae type b (Hib) vaccine  You may need this if you have certain conditions. Human papillomavirus (HPV) vaccine  If recommended by your health care provider, you may need three doses over 6 months. You may receive vaccines as individual doses or as more than one vaccine together in one shot (combination vaccines). Talk with your health care provider about the risks and benefits of combination vaccines. What tests do I need? Blood tests  Lipid and cholesterol levels. These may be checked every 5 years, or more frequently if you are over 80 years old.  Hepatitis C test.  Hepatitis B test. Screening  Lung cancer screening. You may have this screening every year starting at age 102 if you have a 30-pack-year history of smoking and currently smoke or have quit within the past 15 years.  Colorectal cancer screening. All adults should have this screening starting at age 41 and continuing  until age 75. Your health care provider may recommend screening at age 38 if you are at increased risk. You will have tests every 1-10 years, depending on your results and the type of screening test.  Diabetes screening. This is done by checking your blood sugar (glucose) after you have not eaten for a while (fasting). You may have this done  every 1-3 years.  Mammogram. This may be done every 1-2 years. Talk with your health care provider about when you should start having regular mammograms. This may depend on whether you have a family history of breast cancer.  BRCA-related cancer screening. This may be done if you have a family history of breast, ovarian, tubal, or peritoneal cancers.  Pelvic exam and Pap test. This may be done every 3 years starting at age 88. Starting at age 27, this may be done every 5 years if you have a Pap test in combination with an HPV test. Other tests  Sexually transmitted disease (STD) testing.  Bone density scan. This is done to screen for osteoporosis. You may have this scan if you are at high risk for osteoporosis. Follow these instructions at home: Eating and drinking  Eat a diet that includes fresh fruits and vegetables, whole grains, lean protein, and low-fat dairy.  Take vitamin and mineral supplements as recommended by your health care provider.  Do not drink alcohol if: ? Your health care provider tells you not to drink. ? You are pregnant, may be pregnant, or are planning to become pregnant.  If you drink alcohol: ? Limit how much you have to 0-1 drink a day. ? Be aware of how much alcohol is in your drink. In the [place] one drink equals one 12 oz bottle of beer (355 mL), one 5 oz glass of wine (148 mL), or one 1 oz glass of hard liquor (44 mL). Lifestyle  Take daily care of your teeth and gums.  Stay active. Exercise for at least 30 minutes on 5 or more days each week.  Do not use any products that contain nicotine or tobacco, such as cigarettes, e-cigarettes, and chewing tobacco. If you need help quitting, ask your health care provider.  If you are sexually active, practice safe sex. Use a condom or other form of birth control (contraception) in order to prevent pregnancy and STIs (sexually transmitted infections).  If told by your health care provider, take low-dose aspirin  daily starting at age 46. What's next?  Visit your health care provider once a year for a well check visit.  Ask your health care provider how often you should have your eyes and teeth checked.  Stay up to date on all vaccines. This information is not intended to replace advice given to you by your health care provider. Make sure you discuss any questions you have with your health care provider. Document Revised: 05/07/2018 Document Reviewed: 05/07/2018 Elsevier Patient Education  2020 [employer].

## 2020-03-01 NOTE — Progress Notes (Signed)
Subjective:    Patient ID: Miranda Greene, female    DOB: 17-Nov-1955, 64 y.o.   MRN: [id number]  HPI  This visit occurred during the SARS-CoV-2 public health emergency.  Safety protocols were in place, including screening questions prior to the visit, additional usage of staff PPE, and extensive cleaning of exam room while observing appropriate contact time as indicated for disinfecting solutions.   Ms. Miranda Greene is a 64 year old female who presents today for complete physical.  She continues to struggle with a sore on the side of her mouth. She's been treated several times with various creams without improvement. She has not seen dermatology.   Immunizations: -Tetanus: Completed in 2017 -Influenza: Due this season  -Shingles: Declines  -Pneumonia: Completed in 2017 -Covid-19: Completed series   Diet: She endorses a fair diet, fruits, vegetables, salads Exercise: She is walking some afternoons   Eye exam: UTD Dental exam: No recent visit   Pap Smear: Hysterectomy  Mammogram: Completed in August 2020, breast cancer Colonoscopy: Completed Cologuard in 2018 Hep C Screen: Negative   BP Readings from Last 3 Encounters:  03/01/20 134/82  11/02/19 138/83  09/21/19 125/79   She is checking her blood sugars once or weekly and is getting readings of:  AM fasting: low 100's  Lowest reading: 76  The 10-year ASCVD risk score Mikey Bussing DC Jr., et al., 2013) is: 8.9%   Values used to calculate the score:     Age: 28 years     Sex: Female     Is Non-Hispanic African American: No     Diabetic: Yes     Tobacco smoker: No     Systolic Blood Pressure: 532 mmHg     Is BP treated: No     HDL Cholesterol: 37.6 mg/dL     Total Cholesterol: 159 mg/dL   Review of Systems  Constitutional: Negative for unexpected weight change.  HENT: Negative for rhinorrhea.   Respiratory: Negative for cough and shortness of breath.   Cardiovascular: Negative for chest pain.  Gastrointestinal: Negative for  constipation and diarrhea.  Genitourinary: Negative for difficulty urinating.  Musculoskeletal: Negative for arthralgias and myalgias.  Skin: Negative for rash.       Chronic skin mass  Allergic/Immunologic: Negative for environmental allergies.  Neurological: Negative for dizziness, numbness and headaches.  Psychiatric/Behavioral: The patient is nervous/anxious.        Past Medical History:  Diagnosis Date  . Arthritis    Hands, feet  . Cancer (HCC)    breast  . Diabetes mellitus   . GERD (gastroesophageal reflux disease)   . Hyperlipidemia   . Lupus ([place])      Social History   Socioeconomic History  . Marital status: Married    Spouse name: Not on file  . Number of children: Not on file  . Years of education: Not on file  . Highest education level: Not on file  Occupational History  . Not on file  Tobacco Use  . Smoking status: Never Smoker  . Smokeless tobacco: Never Used  Vaping Use  . Vaping Use: Never used  Substance and Sexual Activity  . Alcohol use: No  . Drug use: No  . Sexual activity: Not on file  Other Topics Concern  . Not on file  Social History Narrative   Married.   4 children.    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  Social Determinants of Health   Financial Resource Strain:   . Difficulty of Paying Living Expenses:   Food Insecurity:   . Worried About Charity fundraiser in the Last Year:   . Arboriculturist in the Last Year:   Transportation Needs:   . Film/video editor (Medical):   Marland Kitchen Lack of Transportation (Non-Medical):   Physical Activity:   . Days of Exercise per Week:   . Minutes of Exercise per Session:   Stress:   . Feeling of Stress :   Social Connections:   . Frequency of Communication with Friends and Family:   . Frequency of Social Gatherings with Friends and Family:   . Attends Religious Services:   . Active Member of Clubs or  Organizations:   . Attends Archivist Meetings:   Marland Kitchen Marital Status:   Intimate Partner Violence:   . Fear of Current or Ex-Partner:   . Emotionally Abused:   Marland Kitchen Physically Abused:   . Sexually Abused:     Past Surgical History:  Procedure Laterality Date  . ABDOMINAL HYSTERECTOMY    . BLADDER SURGERY    . BREAST EXCISIONAL BIOPSY Right    benign 2009  . BREAST RECONSTRUCTION WITH PLACEMENT OF TISSUE EXPANDER AND FLEX HD (ACELLULAR HYDRATED DERMIS) Bilateral 06/21/2019   Procedure: BREAST RECONSTRUCTION WITH PLACEMENT OF TISSUE EXPANDER AND FLEX HD (ACELLULAR HYDRATED DERMIS);  Surgeon: Wallace Going, DO;  Location: [place];  Service: Plastics;  Laterality: Bilateral;  . MASTECTOMY Bilateral 06/21/2019  . MASTECTOMY W/ SENTINEL NODE BIOPSY Bilateral 06/21/2019   Procedure: BILATERAL MASTECTOMIES;  Surgeon: Coralie Keens, MD;  Location: [place];  Service: General;  Laterality: Bilateral;  . REMOVAL OF BILATERAL TISSUE EXPANDERS WITH PLACEMENT OF BILATERAL BREAST IMPLANTS Bilateral 09/08/2019   Procedure: REMOVAL OF BILATERAL TISSUE EXPANDERS WITH PLACEMENT OF BILATERAL BREAST IMPLANTS;  Surgeon: Wallace Going, DO;  Location: [place];  Service: Plastics;  Laterality: Bilateral;  . SENTINEL NODE BIOPSY Left 06/21/2019   Procedure: Left Sentinel Lymph Node Biopsy;  Surgeon: Coralie Keens, MD;  Location: [place];  Service: General;  Laterality: Left;    Family History  Problem Relation Age of Onset  . Alcohol abuse Father   . Lung cancer Father   . Heart disease Father   . Stroke Maternal Grandmother   . Arthritis Paternal Grandmother   . Heart disease Paternal Grandmother   . Diabetes Paternal Grandmother   . Arthritis Mother   . Stroke Mother   . Kidney disease Mother   . Diabetes Mother   . Dementia Mother     Allergies  Allergen Reactions  . Meloxicam Hives  . Invokana [Canagliflozin] Rash    Current Outpatient Medications on  File Prior to Visit  Medication Sig Dispense Refill  . ibuprofen (ADVIL,MOTRIN) 200 MG tablet Take 200 mg by mouth every 6 (six) hours as needed for moderate pain.     . Insulin Glargine (LANTUS SOLOSTAR) 100 UNIT/ML Solostar Pen Inject 20 Units into the skin at bedtime. 15 mL 3  . metFORMIN (GLUCOPHAGE) 1000 MG tablet Take 1 tablet (1,000 mg total) by mouth 2 (two) times daily with a meal. For diabetes. 180 tablet 3  . omeprazole (PRILOSEC) 20 MG capsule Take 20 mg by mouth daily.     No current facility-administered medications on file prior to visit.    BP 134/82   Pulse 82   Temp (!) 97.2 F (36.2 C) (Temporal)  Ht 5\' 3"  (1.6 m)   Wt 141 lb 4 oz (64.1 kg)   SpO2 96%   BMI 25.02 kg/m    Objective:   Physical Exam  Constitutional: She is oriented to person, place, and time.  HENT:  Right Ear: Tympanic membrane and ear canal normal.  Left Ear: Tympanic membrane and ear canal normal.  Eyes: Pupils are equal, round, and reactive to light.  Cardiovascular: Normal rate and regular rhythm.  Respiratory: Effort normal and breath sounds normal.  GI: Soft. Bowel sounds are normal. There is no abdominal tenderness.  Musculoskeletal:        General: Normal range of motion.     Cervical back: Neck supple.  Neurological: She is alert and oriented to person, place, and time. No cranial nerve deficit.  Reflex Scores:      Patellar reflexes are 2+ on the right side and 2+ on the left side. Skin: Skin is warm and dry.  Chronic mass to corner of right mouth, red, raised.           Assessment & Plan:

## 2020-03-01 NOTE — Assessment & Plan Note (Signed)
Doing well on omeprazole 20 mg, continue same. 

## 2020-03-01 NOTE — Assessment & Plan Note (Signed)
LDL below 100, but ASCVD risk score of 8.9%. Discussed the need for protection with statin therapy, she declines today.   Continue to monitor.

## 2020-03-15 ENCOUNTER — Encounter: Payer: Self-pay | Admitting: Primary Care

## 2020-03-15 DIAGNOSIS — C44399 Other specified malignant neoplasm of skin of other parts of face: Secondary | ICD-10-CM | POA: Diagnosis not present

## 2020-03-15 DIAGNOSIS — E859 Amyloidosis, unspecified: Secondary | ICD-10-CM | POA: Diagnosis not present

## 2020-03-28 ENCOUNTER — Telehealth: Payer: Self-pay | Admitting: Primary Care

## 2020-03-28 DIAGNOSIS — C439 Malignant melanoma of skin, unspecified: Secondary | ICD-10-CM

## 2020-03-28 NOTE — Telephone Encounter (Signed)
Dr.McConnell called.  Patient was referred by Anda Kraft to [place]. Dr. Rozann Lesches said she'll fax patient's office note and biopsy to [facility].  Dr.McConnell said patient may need a work up and a referral to Hemotology/Oncology. If Anda Kraft has any questions, Dr.McConnell left her phone number.

## 2020-03-29 NOTE — Telephone Encounter (Signed)
I reviewed the pathology report and notes from Dr. Rozann Lesches.  I also placed a referral to Hem/Onc. Patient is aware.

## 2020-03-31 ENCOUNTER — Telehealth: Payer: Self-pay | Admitting: Oncology

## 2020-03-31 ENCOUNTER — Telehealth: Payer: Self-pay

## 2020-03-31 NOTE — Telephone Encounter (Signed)
Received a new pt referral from Miranda Friendly, NP for a new dx of melanoma. Miranda Greene has been cld and scheduled to see Miranda Greene on 8/3 at 2pm. Per Miranda Greene, this referral is appropriate for Miranda Greene. Miranda Greene is aware to arrive 15 minutes early.

## 2020-03-31 NOTE — Telephone Encounter (Signed)
Received message from Isaiah Blakes about referral for pt w/ Melanoma and needs referral to oncology. Pt requested Dr Lindi Adie; however, per Dr Lindi Adie it is appropriate for pt to be seen by Dr Alen Blew for this. Pt was made aware and is OK with this plan. Message sent to [place] her of this.

## 2020-04-11 ENCOUNTER — Other Ambulatory Visit: Payer: Self-pay

## 2020-04-11 ENCOUNTER — Inpatient Hospital Stay: Payer: BC Managed Care – PPO

## 2020-04-11 ENCOUNTER — Inpatient Hospital Stay: Payer: BC Managed Care – PPO | Attending: Oncology | Admitting: Oncology

## 2020-04-11 VITALS — BP 142/85 | HR 98 | Temp 97.0°F | Resp 17 | Ht 63.0 in | Wt 142.9 lb

## 2020-04-11 DIAGNOSIS — Z86000 Personal history of in-situ neoplasm of breast: Secondary | ICD-10-CM | POA: Insufficient documentation

## 2020-04-11 DIAGNOSIS — K219 Gastro-esophageal reflux disease without esophagitis: Secondary | ICD-10-CM | POA: Insufficient documentation

## 2020-04-11 DIAGNOSIS — C50919 Malignant neoplasm of unspecified site of unspecified female breast: Secondary | ICD-10-CM

## 2020-04-11 DIAGNOSIS — Z9013 Acquired absence of bilateral breasts and nipples: Secondary | ICD-10-CM | POA: Diagnosis not present

## 2020-04-11 DIAGNOSIS — Z8249 Family history of ischemic heart disease and other diseases of the circulatory system: Secondary | ICD-10-CM | POA: Insufficient documentation

## 2020-04-11 DIAGNOSIS — D729 Disorder of white blood cells, unspecified: Secondary | ICD-10-CM

## 2020-04-11 DIAGNOSIS — Z791 Long term (current) use of non-steroidal anti-inflammatories (NSAID): Secondary | ICD-10-CM | POA: Diagnosis not present

## 2020-04-11 DIAGNOSIS — M329 Systemic lupus erythematosus, unspecified: Secondary | ICD-10-CM | POA: Diagnosis not present

## 2020-04-11 DIAGNOSIS — Z801 Family history of malignant neoplasm of trachea, bronchus and lung: Secondary | ICD-10-CM | POA: Diagnosis not present

## 2020-04-11 DIAGNOSIS — E785 Hyperlipidemia, unspecified: Secondary | ICD-10-CM | POA: Insufficient documentation

## 2020-04-11 DIAGNOSIS — E119 Type 2 diabetes mellitus without complications: Secondary | ICD-10-CM | POA: Diagnosis not present

## 2020-04-11 DIAGNOSIS — Z8261 Family history of arthritis: Secondary | ICD-10-CM | POA: Insufficient documentation

## 2020-04-11 DIAGNOSIS — Z79899 Other long term (current) drug therapy: Secondary | ICD-10-CM | POA: Insufficient documentation

## 2020-04-11 DIAGNOSIS — C4490 Unspecified malignant neoplasm of skin, unspecified: Secondary | ICD-10-CM

## 2020-04-11 DIAGNOSIS — C9030 Solitary plasmacytoma not having achieved remission: Secondary | ICD-10-CM | POA: Insufficient documentation

## 2020-04-11 DIAGNOSIS — Z794 Long term (current) use of insulin: Secondary | ICD-10-CM | POA: Insufficient documentation

## 2020-04-11 DIAGNOSIS — Z833 Family history of diabetes mellitus: Secondary | ICD-10-CM | POA: Insufficient documentation

## 2020-04-11 LAB — CMP (CANCER CENTER ONLY)
ALT: 11 U/L (ref 0–44)
AST: 16 U/L (ref 15–41)
Albumin: 4.2 g/dL (ref 3.5–5.0)
Alkaline Phosphatase: 75 U/L (ref 38–126)
Anion gap: 10 (ref 5–15)
BUN: 14 mg/dL (ref 8–23)
CO2: 24 mmol/L (ref 22–32)
Calcium: 10 mg/dL (ref 8.9–10.3)
Chloride: 103 mmol/L (ref 98–111)
Creatinine: 0.94 mg/dL (ref 0.44–1.00)
GFR, Est AFR Am: >60 mL/min (ref >60)
GFR, Estimated: >60 mL/min (ref >60)
Glucose, Bld: 158 mg/dL — ABNORMAL HIGH (ref 70–99)
Potassium: 4.1 mmol/L (ref 3.5–5.1)
Sodium: 137 mmol/L (ref 135–145)
Total Bilirubin: 0.5 mg/dL (ref 0.3–1.2)
Total Protein: 7.7 g/dL (ref 6.5–8.1)

## 2020-04-11 LAB — CBC WITH DIFFERENTIAL (CANCER CENTER ONLY)
Abs Immature Granulocytes: 0.07 10*3/uL (ref 0.00–0.07)
Basophils Absolute: 0.1 10*3/uL (ref 0.0–0.1)
Basophils Relative: 1 %
Eosinophils Absolute: 0.1 10*3/uL (ref 0.0–0.5)
Eosinophils Relative: 2 %
HCT: 36.8 % (ref 36.0–46.0)
Hemoglobin: 12.4 g/dL (ref 12.0–15.0)
Immature Granulocytes: 1 %
Lymphocytes Relative: 24 %
Lymphs Abs: 1.9 10*3/uL (ref 0.7–4.0)
MCH: 30.5 pg (ref 26.0–34.0)
MCHC: 33.7 g/dL (ref 30.0–36.0)
MCV: 90.4 fL (ref 80.0–100.0)
Monocytes Absolute: 0.5 10*3/uL (ref 0.1–1.0)
Monocytes Relative: 6 %
Neutro Abs: 5.5 10*3/uL (ref 1.7–7.7)
Neutrophils Relative %: 66 %
Platelet Count: 304 10*3/uL (ref 150–400)
RBC: 4.07 MIL/uL (ref 3.87–5.11)
RDW: 12.7 % (ref 11.5–15.5)
WBC Count: 8.2 10*3/uL (ref 4.0–10.5)
nRBC: 0 % (ref 0.0–0.2)

## 2020-04-11 NOTE — Progress Notes (Signed)
Reason for the request:    Plasma cell disorder  HPI: I was asked by Loma Boston, NP to evaluate Ms. Delehanty for the evaluation of a possible plasma cell disorder.  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  She underwent bilateral mastectomy completed in October 2020.  She has not required any additional treatment at that time.  She has been noticing a growing lesion on the side of her lip on the right side for the last year or so.  She was evaluated by dermatology in the last month.  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  Laboratory data from February 25, 2020 showed no abnormal electrolytes or kidney function.  She had a normal CBC as well.  Clinically, she does not report any major complaints.  She does report some mild fatigue and tiredness and occasional dyspnea on exertion.  She denies any worsening edema or easy bruising.  She denies any cough or shortness of breath.  She denies any other skin rashes or lesions.  She does not report any headaches, blurry vision, syncope or seizures. Does not report any fevers, chills or sweats.  Does not report any cough, wheezing or hemoptysis.  Does not report any chest pain, palpitation, orthopnea or leg edema.  Does not report any nausea, vomiting or abdominal pain.  Does not report any constipation or diarrhea.  Does not report any skeletal complaints.    Does not report frequency, urgency or hematuria.  Does not report any skin rashes or lesions. Does not report any heat or cold intolerance.  Does not report any lymphadenopathy or petechiae.  Does not report any anxiety or depression.  Remaining review of systems is negative.    Past Medical History:  Diagnosis Date  . Arthritis    Hands, feet  . Cancer (HCC)    breast  . Diabetes mellitus   . GERD (gastroesophageal reflux disease)   .  Hyperlipidemia   . Lupus ([place])   :  Past Surgical History:  Procedure Laterality Date  . ABDOMINAL HYSTERECTOMY    . BLADDER SURGERY    . BREAST EXCISIONAL BIOPSY Right    benign 2009  . BREAST RECONSTRUCTION WITH PLACEMENT OF TISSUE EXPANDER AND FLEX HD (ACELLULAR HYDRATED DERMIS) Bilateral 06/21/2019   Procedure: BREAST RECONSTRUCTION WITH PLACEMENT OF TISSUE EXPANDER AND FLEX HD (ACELLULAR HYDRATED DERMIS);  Surgeon: Wallace Going, DO;  Location: [place];  Service: Plastics;  Laterality: Bilateral;  . MASTECTOMY Bilateral 06/21/2019  . MASTECTOMY W/ SENTINEL NODE BIOPSY Bilateral 06/21/2019   Procedure: BILATERAL MASTECTOMIES;  Surgeon: Coralie Keens, MD;  Location: [place];  Service: General;  Laterality: Bilateral;  . REMOVAL OF BILATERAL TISSUE EXPANDERS WITH PLACEMENT OF BILATERAL BREAST IMPLANTS Bilateral 09/08/2019   Procedure: REMOVAL OF BILATERAL TISSUE EXPANDERS WITH PLACEMENT OF BILATERAL BREAST IMPLANTS;  Surgeon: Wallace Going, DO;  Location: [place];  Service: Plastics;  Laterality: Bilateral;  . SENTINEL NODE BIOPSY Left 06/21/2019   Procedure: Left Sentinel Lymph Node Biopsy;  Surgeon: Coralie Keens, MD;  Location: [place];  Service: General;  Laterality: Left;  :   Current Outpatient Medications:  .  ibuprofen (ADVIL,MOTRIN) 200 MG tablet, Take 200 mg by mouth every 6 (six) hours as needed for moderate pain. , Disp: , Rfl:  .  Insulin Glargine (LANTUS SOLOSTAR) 100 UNIT/ML  Solostar Pen, Inject 20 Units into the skin at bedtime., Disp: 15 mL, Rfl: 3 .  metFORMIN (GLUCOPHAGE) 1000 MG tablet, Take 1 tablet (1,000 mg total) by mouth 2 (two) times daily with a meal. For diabetes., Disp: 180 tablet, Rfl: 3 .  omeprazole (PRILOSEC) 20 MG capsule, Take 20 mg by mouth daily., Disp: , Rfl: :  Allergies  Allergen Reactions  . Meloxicam Hives  . Invokana [Canagliflozin] Rash  :  Family History  Problem Relation Age of Onset  . Alcohol abuse  Father   . Lung cancer Father   . Heart disease Father   . Stroke Maternal Grandmother   . Arthritis Paternal Grandmother   . Heart disease Paternal Grandmother   . Diabetes Paternal Grandmother   . Arthritis Mother   . Stroke Mother   . Kidney disease Mother   . Diabetes Mother   . Dementia Mother   :  Social History   Socioeconomic History  . Marital status: Married    Spouse name: Not on file  . Number of children: Not on file  . Years of education: Not on file  . Highest education level: Not on file  Occupational History  . Not on file  Tobacco Use  . Smoking status: Never Smoker  . Smokeless tobacco: Never Used  Vaping Use  . Vaping Use: Never used  Substance and Sexual Activity  . Alcohol use: No  . Drug use: No  . Sexual activity: Not on file  Other Topics Concern  . Not on file  Social History Narrative   Married.   4 children.    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   Social Determinants of Health   Financial Resource Strain:   . Difficulty of Paying Living Expenses:   Food Insecurity:   . Worried About Charity fundraiser in the Last Year:   . Arboriculturist in the Last Year:   Transportation Needs:   . Film/video editor (Medical):   Marland Kitchen Lack of Transportation (Non-Medical):   Physical Activity:   . Days of Exercise per Week:   . Minutes of Exercise per Session:   Stress:   . Feeling of Stress :   Social Connections:   . Frequency of Communication with Friends and Family:   . Frequency of Social Gatherings with Friends and Family:   . Attends Religious Services:   . Active Member of Clubs or Organizations:   . Attends Archivist Meetings:   Marland Kitchen Marital Status:   Intimate Partner Violence:   . Fear of Current or Ex-Partner:   . Emotionally Abused:   Marland Kitchen Physically Abused:   . Sexually Abused:   :   Physical exam  Blood pressure (!) 142/85, pulse  98, temperature (!) 97 F (36.1 C), temperature source Temporal, resp. rate 17, height _0  (1.6 m), weight 142 lb 14.4 oz (64.8 kg), SpO2 97 %.  ECOG 1   General appearance: alert and cooperative appeared without distress. Head: atraumatic without any abnormalities. Eyes: conjunctivae/corneas clear. PERRL.  Sclera anicteric. Throat: lips, mucosa, and tongue normal; without oral thrush or ulcers. Resp: clear to auscultation bilaterally without rhonchi, wheezes or dullness to percussion. Cardio: regular rate and rhythm, S1, S2 normal, no murmur, click, rub or gallop GI: soft, non-tender; bowel sounds normal; no masses,  no organomegaly Skin: Hypopigmented protrusion noted on the right aspect of  her lip. Lymph nodes: Cervical, supraclavicular, and axillary nodes normal. Neurologic: Grossly normal without any motor, sensory or deep tendon reflexes. Musculoskeletal: No joint deformity or effusion.  CBC    Component Value Date/Time   WBC 6.3 02/25/2020 0743   RBC 4.21 02/25/2020 0743   HGB 12.7 02/25/2020 0743   HCT 37.7 02/25/2020 0743   PLT 299.0 02/25/2020 0743   MCV 89.5 02/25/2020 0743   MCH 30.8 06/22/2019 0334   MCHC 33.7 02/25/2020 0743   RDW 13.6 02/25/2020 0743     Chemistry      Component Value Date/Time   NA 139 02/25/2020 0743   K 4.1 02/25/2020 0743   CL 104 02/25/2020 0743   CO2 29 02/25/2020 0743   BUN 17 02/25/2020 0743   CREATININE 0.84 02/25/2020 0743      Component Value Date/Time   CALCIUM 9.5 02/25/2020 0743   ALKPHOS 71 02/25/2020 0743   AST 13 02/25/2020 0743   ALT 10 02/25/2020 0743   BILITOT 0.5 02/25/2020 0743       Assessment and Plan:    64 year old woman with:  1.  Cutaneous manifestation of plasma cell disorder detected on a shave biopsy from her lip in July 2021.  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  She has no other dermatological manifestations with overall normal labs.  The differential  diagnosis of these findings were reviewed today.  Plasma cell disorder with cutaneous manifestation especially with amyloid features raises the possibility of systemic amyloidosis.  Multiple myeloma or lymphoma is considered less likely at this point.  Reactive amyloid deposits related to a autoimmune disease could also be a possibility.  She has a remote history of lupus with unclear manifestation is also on the differential.  Systemic amyloidosis need to be evaluated and ruled out at this time.  In order to do so, we will obtain serum protein electrophoresis, quantitative immunoglobulins, serum light chains as well as 24-hour urine collection evaluate for protein studies.  Obtain a PET scan as well as echocardiogram to evaluate for possible systemic involvement of amyloidosis including heart and other organs.  Obtain a bone marrow biopsy would be the final step to complete her work-up and evaluation.  Management options will be discussed once her work-up is complete and once the extent of the disease is determined.  If she has limited cutaneous manifestations, then local therapy would be recommended.  Systemic disease will require systemic treatment if that is the case.  She will complete the work-up on return to discuss these results in the next few weeks.  2.  DCIS of the breast: She is status post bilateral mastectomy and not currently receiving any treatment.  3.  Follow-up: Will be in the next few months to test her work-up.   45  minutes were dedicated to this visit. The time was spent on reviewing laboratory data, discussing treatment options, discussing differential diagnosis and answering questions regarding future plan.      A copy of this consult has been forwarded to the requesting provider.

## 2020-04-12 LAB — MULTIPLE MYELOMA PANEL, SERUM
Albumin SerPl Elph-Mcnc: 3.9 g/dL (ref 2.9–4.4)
Albumin/Glob SerPl: 1.3 (ref 0.7–1.7)
Alpha 1: 0.2 g/dL (ref 0.0–0.4)
Alpha2 Glob SerPl Elph-Mcnc: 0.7 g/dL (ref 0.4–1.0)
B-Globulin SerPl Elph-Mcnc: 1.1 g/dL (ref 0.7–1.3)
Gamma Glob SerPl Elph-Mcnc: 1 g/dL (ref 0.4–1.8)
Globulin, Total: 3.1 g/dL (ref 2.2–3.9)
IgA: 411 mg/dL — ABNORMAL HIGH (ref 87–352)
IgG (Immunoglobin G), Serum: 1029 mg/dL (ref 586–1602)
IgM (Immunoglobulin M), Srm: 48 mg/dL (ref 26–217)
Total Protein ELP: 7 g/dL (ref 6.0–8.5)

## 2020-04-12 LAB — KAPPA/LAMBDA LIGHT CHAINS
Kappa free light chain: 28.8 mg/L — ABNORMAL HIGH (ref 3.3–19.4)
Kappa, lambda light chain ratio: 1.55 (ref 0.26–1.65)
Lambda free light chains: 18.6 mg/L (ref 5.7–26.3)

## 2020-04-17 ENCOUNTER — Other Ambulatory Visit: Payer: Self-pay

## 2020-04-17 DIAGNOSIS — Z794 Long term (current) use of insulin: Secondary | ICD-10-CM | POA: Diagnosis not present

## 2020-04-17 DIAGNOSIS — Z8261 Family history of arthritis: Secondary | ICD-10-CM | POA: Diagnosis not present

## 2020-04-17 DIAGNOSIS — Z86000 Personal history of in-situ neoplasm of breast: Secondary | ICD-10-CM | POA: Diagnosis not present

## 2020-04-17 DIAGNOSIS — E119 Type 2 diabetes mellitus without complications: Secondary | ICD-10-CM | POA: Diagnosis not present

## 2020-04-17 DIAGNOSIS — M329 Systemic lupus erythematosus, unspecified: Secondary | ICD-10-CM | POA: Diagnosis not present

## 2020-04-17 DIAGNOSIS — Z8249 Family history of ischemic heart disease and other diseases of the circulatory system: Secondary | ICD-10-CM | POA: Diagnosis not present

## 2020-04-17 DIAGNOSIS — Z79899 Other long term (current) drug therapy: Secondary | ICD-10-CM | POA: Diagnosis not present

## 2020-04-17 DIAGNOSIS — Z833 Family history of diabetes mellitus: Secondary | ICD-10-CM | POA: Diagnosis not present

## 2020-04-17 DIAGNOSIS — Z791 Long term (current) use of non-steroidal anti-inflammatories (NSAID): Secondary | ICD-10-CM | POA: Diagnosis not present

## 2020-04-17 DIAGNOSIS — Z801 Family history of malignant neoplasm of trachea, bronchus and lung: Secondary | ICD-10-CM | POA: Diagnosis not present

## 2020-04-17 DIAGNOSIS — K219 Gastro-esophageal reflux disease without esophagitis: Secondary | ICD-10-CM | POA: Diagnosis not present

## 2020-04-17 DIAGNOSIS — E785 Hyperlipidemia, unspecified: Secondary | ICD-10-CM | POA: Diagnosis not present

## 2020-04-17 DIAGNOSIS — Z9013 Acquired absence of bilateral breasts and nipples: Secondary | ICD-10-CM | POA: Diagnosis not present

## 2020-04-17 DIAGNOSIS — D729 Disorder of white blood cells, unspecified: Secondary | ICD-10-CM

## 2020-04-17 DIAGNOSIS — C9030 Solitary plasmacytoma not having achieved remission: Secondary | ICD-10-CM | POA: Diagnosis not present

## 2020-04-18 ENCOUNTER — Encounter (HOSPITAL_COMMUNITY): Payer: Self-pay

## 2020-04-18 ENCOUNTER — Ambulatory Visit (HOSPITAL_COMMUNITY): Payer: BC Managed Care – PPO

## 2020-04-19 LAB — UPEP/UIFE/LIGHT CHAINS/TP, 24-HR UR
% BETA, Urine: 22.4 %
ALPHA 1 URINE: 2.5 %
Albumin, U: 27.9 %
Alpha 2, Urine: 4.7 %
Free Kappa Lt Chains,Ur: 13.62 mg/L (ref 0.63–113.79)
Free Kappa/Lambda Ratio: 8.9 (ref 1.03–31.76)
Free Lambda Lt Chains,Ur: 1.53 mg/L (ref 0.47–11.77)
GAMMA GLOBULIN URINE: 42.5 %
Total Protein, Urine-Ur/day: 150 mg/24 hr (ref 30–150)
Total Protein, Urine: 13.6 mg/dL
Total Volume: 1100

## 2020-04-20 ENCOUNTER — Encounter (HOSPITAL_COMMUNITY): Payer: Self-pay

## 2020-04-20 ENCOUNTER — Other Ambulatory Visit: Payer: Self-pay

## 2020-04-20 ENCOUNTER — Ambulatory Visit (HOSPITAL_COMMUNITY)
Admission: RE | Admit: 2020-04-20 | Discharge: 2020-04-20 | Disposition: A | Discharge status: Home / Self Care | Payer: BC Managed Care – PPO | Source: Ambulatory Visit | Attending: Oncology | Admitting: Oncology

## 2020-04-20 DIAGNOSIS — M329 Systemic lupus erythematosus, unspecified: Secondary | ICD-10-CM | POA: Insufficient documentation

## 2020-04-20 DIAGNOSIS — M19041 Primary osteoarthritis, right hand: Secondary | ICD-10-CM | POA: Diagnosis not present

## 2020-04-20 DIAGNOSIS — Z801 Family history of malignant neoplasm of trachea, bronchus and lung: Secondary | ICD-10-CM | POA: Insufficient documentation

## 2020-04-20 DIAGNOSIS — Z9013 Acquired absence of bilateral breasts and nipples: Secondary | ICD-10-CM | POA: Insufficient documentation

## 2020-04-20 DIAGNOSIS — M19071 Primary osteoarthritis, right ankle and foot: Secondary | ICD-10-CM | POA: Insufficient documentation

## 2020-04-20 DIAGNOSIS — E785 Hyperlipidemia, unspecified: Secondary | ICD-10-CM | POA: Insufficient documentation

## 2020-04-20 DIAGNOSIS — Z794 Long term (current) use of insulin: Secondary | ICD-10-CM | POA: Diagnosis not present

## 2020-04-20 DIAGNOSIS — Z79899 Other long term (current) drug therapy: Secondary | ICD-10-CM | POA: Diagnosis not present

## 2020-04-20 DIAGNOSIS — K219 Gastro-esophageal reflux disease without esophagitis: Secondary | ICD-10-CM | POA: Insufficient documentation

## 2020-04-20 DIAGNOSIS — Z9079 Acquired absence of other genital organ(s): Secondary | ICD-10-CM | POA: Insufficient documentation

## 2020-04-20 DIAGNOSIS — Z853 Personal history of malignant neoplasm of breast: Secondary | ICD-10-CM | POA: Insufficient documentation

## 2020-04-20 DIAGNOSIS — M19042 Primary osteoarthritis, left hand: Secondary | ICD-10-CM | POA: Diagnosis not present

## 2020-04-20 DIAGNOSIS — D729 Disorder of white blood cells, unspecified: Secondary | ICD-10-CM | POA: Insufficient documentation

## 2020-04-20 DIAGNOSIS — M899 Disorder of bone, unspecified: Secondary | ICD-10-CM | POA: Diagnosis not present

## 2020-04-20 DIAGNOSIS — M19072 Primary osteoarthritis, left ankle and foot: Secondary | ICD-10-CM | POA: Insufficient documentation

## 2020-04-20 DIAGNOSIS — E119 Type 2 diabetes mellitus without complications: Secondary | ICD-10-CM | POA: Diagnosis not present

## 2020-04-20 DIAGNOSIS — K130 Diseases of lips: Secondary | ICD-10-CM | POA: Diagnosis not present

## 2020-04-20 LAB — CBC WITH DIFFERENTIAL/PLATELET
Abs Immature Granulocytes: 0.03 10*3/uL (ref 0.00–0.07)
Basophils Absolute: 0 10*3/uL (ref 0.0–0.1)
Basophils Relative: 1 %
Eosinophils Absolute: 0.1 10*3/uL (ref 0.0–0.5)
Eosinophils Relative: 2 %
HCT: 37.2 % (ref 36.0–46.0)
Hemoglobin: 12.3 g/dL (ref 12.0–15.0)
Immature Granulocytes: 1 %
Lymphocytes Relative: 27 %
Lymphs Abs: 1.4 10*3/uL (ref 0.7–4.0)
MCH: 30.4 pg (ref 26.0–34.0)
MCHC: 33.1 g/dL (ref 30.0–36.0)
MCV: 91.9 fL (ref 80.0–100.0)
Monocytes Absolute: 0.4 10*3/uL (ref 0.1–1.0)
Monocytes Relative: 9 %
Neutro Abs: 3 10*3/uL (ref 1.7–7.7)
Neutrophils Relative %: 60 %
Platelets: 267 10*3/uL (ref 150–400)
RBC: 4.05 MIL/uL (ref 3.87–5.11)
RDW: 12.9 % (ref 11.5–15.5)
WBC: 5 10*3/uL (ref 4.0–10.5)
nRBC: 0 % (ref 0.0–0.2)

## 2020-04-20 LAB — GLUCOSE, CAPILLARY: Glucose-Capillary: 143 mg/dL — ABNORMAL HIGH (ref 70–99)

## 2020-04-20 MED ORDER — FENTANYL CITRATE (PF) 100 MCG/2ML IJ SOLN
INTRAMUSCULAR | Status: AC | PRN
  Administered 2020-04-20: 50 ug via INTRAVENOUS
  Administered 2020-04-20 (×2): 25 ug via INTRAVENOUS

## 2020-04-20 MED ORDER — SODIUM CHLORIDE 0.9 % IV SOLN: INTRAVENOUS | Status: DC

## 2020-04-20 MED ORDER — MIDAZOLAM HCL 2 MG/2ML IJ SOLN
INTRAMUSCULAR | Status: AC
  Filled 2020-04-20: qty 4

## 2020-04-20 MED ORDER — MIDAZOLAM HCL 2 MG/2ML IJ SOLN
INTRAMUSCULAR | Status: AC | PRN
  Administered 2020-04-20 (×2): 0.5 mg via INTRAVENOUS
  Administered 2020-04-20: 1 mg via INTRAVENOUS

## 2020-04-20 MED ORDER — FLUMAZENIL 0.5 MG/5ML IV SOLN
INTRAVENOUS | Status: AC
  Filled 2020-04-20: qty 5

## 2020-04-20 MED ORDER — NALOXONE HCL 0.4 MG/ML IJ SOLN
INTRAMUSCULAR | Status: AC
  Filled 2020-04-20: qty 1

## 2020-04-20 MED ORDER — LIDOCAINE HCL (PF) 1 % IJ SOLN
INTRAMUSCULAR | Status: AC | PRN
  Administered 2020-04-20 (×2): 10 mL via INTRADERMAL

## 2020-04-20 MED ORDER — FENTANYL CITRATE (PF) 100 MCG/2ML IJ SOLN
INTRAMUSCULAR | Status: AC
  Filled 2020-04-20: qty 2

## 2020-04-20 NOTE — H&P (Signed)
Chief Complaint: Patient was seen in consultation today for plasma cell disorder/bone marrow biopsy and aspiration.  Referring Physician(s): Wyatt Portela  Supervising Physician: Aletta Edouard  Patient Status: [facility] - Out-pt  History of Present Illness: Miranda Greene is a 64 y.o. female with a past medical history of hyperlipidemia, GERD, lupus, breast cancer, diabetes mellitus, and arthritis. She noticed a lesion on her left lip, and went to dermatology for shave biopsy revealing plasma cell disorder. She was then referred to hematology/oncology for further management who recommended IR consult for possible bone marrow biopsy/aspiration for further evaluation.  IR requested by Dr. Alen Blew for possible image-guided bone marrow biopsy/aspiration. Patient awake and alert sitting in bed watching TV with no complaints at this time. Denies fever, chills, chest pain, dyspnea, abdominal pain, or headache.   Past Medical History:  Diagnosis Date   Arthritis    Hands, feet   Cancer ([place])    breast   Diabetes mellitus    GERD (gastroesophageal reflux disease)    Hyperlipidemia    Lupus ([place])     Past Surgical History:  Procedure Laterality Date   ABDOMINAL HYSTERECTOMY     BLADDER SURGERY     BREAST EXCISIONAL BIOPSY Right    benign 2009   BREAST RECONSTRUCTION WITH PLACEMENT OF TISSUE EXPANDER AND FLEX HD (ACELLULAR HYDRATED DERMIS) Bilateral 06/21/2019   Procedure: BREAST RECONSTRUCTION WITH PLACEMENT OF TISSUE EXPANDER AND FLEX HD (ACELLULAR HYDRATED DERMIS);  Surgeon: Wallace Going, DO;  Location: [facility];  Service: Plastics;  Laterality: Bilateral;   MASTECTOMY Bilateral 06/21/2019   MASTECTOMY W/ SENTINEL NODE BIOPSY Bilateral 06/21/2019   Procedure: BILATERAL MASTECTOMIES;  Surgeon: Coralie Keens, MD;  Location: [facility];  Service: General;  Laterality: Bilateral;   REMOVAL OF BILATERAL TISSUE EXPANDERS WITH PLACEMENT OF BILATERAL BREAST IMPLANTS  Bilateral 09/08/2019   Procedure: REMOVAL OF BILATERAL TISSUE EXPANDERS WITH PLACEMENT OF BILATERAL BREAST IMPLANTS;  Surgeon: Wallace Going, DO;  Location: [facility];  Service: Plastics;  Laterality: Bilateral;   SENTINEL NODE BIOPSY Left 06/21/2019   Procedure: Left Sentinel Lymph Node Biopsy;  Surgeon: Coralie Keens, MD;  Location: [facility];  Service: General;  Laterality: Left;    Allergies: Meloxicam and Invokana [canagliflozin]  Medications: Prior to Admission medications   Medication Sig Start Date End Date Taking? Authorizing Provider  ibuprofen (ADVIL,MOTRIN) 200 MG tablet Take 200 mg by mouth every 6 (six) hours as needed for moderate pain.    Yes [provider]  Insulin Glargine (LANTUS SOLOSTAR) 100 UNIT/ML Solostar Pen Inject 20 Units into the skin at bedtime. 08/23/19  Yes Pleas Koch, NP  metFORMIN (GLUCOPHAGE) 1000 MG tablet Take 1 tablet (1,000 mg total) by mouth 2 (two) times daily with a meal. For diabetes. 09/01/19  Yes Pleas Koch, NP  omeprazole (PRILOSEC) 20 MG capsule Take 20 mg by mouth daily.   Yes [provider]     Family History  Problem Relation Age of Onset   Alcohol abuse Father    Lung cancer Father    Heart disease Father    Stroke Maternal Grandmother    Arthritis Paternal Grandmother    Heart disease Paternal Grandmother    Diabetes Paternal Grandmother    Arthritis Mother    Stroke Mother    Kidney disease Mother    Diabetes Mother    Dementia Mother     Social History   Socioeconomic History   Marital status: Married    Spouse  name: Not on file   Number of children: Not on file   Years of education: Not on file   Highest education level: Not on file  Occupational History   Not on file  Tobacco Use   Smoking status: Never Smoker   Smokeless tobacco: Never Used  Vaping Use   Vaping Use: Never used  Substance and Sexual Activity   Alcohol use: No    Drug use: No   Sexual activity: Not on file  Other Topics Concern   Not on file  Social History Narrative   Married.   4 children.    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   Social Determinants of Health   Financial Resource Strain:    Difficulty of Paying Living Expenses:   Food Insecurity:    Worried About Charity fundraiser in the Last Year:    Arboriculturist in the Last Year:   Transportation Needs:    Film/video editor (Medical):    Lack of Transportation (Non-Medical):   Physical Activity:    Days of Exercise per Week:    Minutes of Exercise per Session:   Stress:    Feeling of Stress :   Social Connections:    Frequency of Communication with Friends and Family:    Frequency of Social Gatherings with Friends and Family:    Attends Religious Services:    Active Member of Clubs or Organizations:    Attends Archivist Meetings:    Marital Status:      Review of Systems: A 12 point ROS discussed and pertinent positives are indicated in the HPI above.  All other systems are negative.  Review of Systems  Constitutional: Negative for chills and fever.  Respiratory: Negative for shortness of breath and wheezing.   Cardiovascular: Negative for chest pain and palpitations.  Gastrointestinal: Negative for abdominal pain.  Neurological: Negative for headaches.  Psychiatric/Behavioral: Negative for behavioral problems and confusion.    Vital Signs: BP (!) 144/80 (BP Location: Right Arm)    Pulse 88    Temp 97.6 F (36.4 C) (Oral)    Resp 16    SpO2 100%   Physical Exam Vitals and nursing note reviewed.  Constitutional:      General: She is not in acute distress.    Appearance: Normal appearance.  Cardiovascular:     Rate and Rhythm: Normal rate and regular rhythm.     Heart sounds: Normal heart sounds. No murmur heard.   Pulmonary:     Effort:  Pulmonary effort is normal. No respiratory distress.     Breath sounds: Normal breath sounds. No wheezing.  Skin:    General: Skin is warm and dry.  Neurological:     Mental Status: She is alert and oriented to person, place, and time.      MD Evaluation Airway: WNL Heart: WNL Abdomen: WNL Chest/ Lungs: WNL ASA  Classification: 3 Mallampati/Airway Score: Two   Imaging: No results found.  Labs:  CBC: Recent Labs    06/22/19 0334 02/25/20 0743 04/11/20 1438 04/20/20 0750  WBC 8.8 6.3 8.2 5.0  HGB 11.9* 12.7 12.4 12.3  HCT 36.0 37.7 36.8 37.2  PLT 255 299.0 304 267    COAGS: No results for input(s): INR, APTT in the last 8760 hours.  BMP: Recent Labs    06/16/19 0831 06/16/19 0831 06/22/19 0334 09/02/19 0730 02/25/20 8841  04/11/20 1438  NA 135   < > 136 137 139 137  K 4.3   < > 4.6 5.0 4.1 4.1  CL 104   < > 100 103 104 103  CO2 26   < > _0 GLUCOSE 177*   < > 172* 216* 82 158*  BUN 18   < > _1 CALCIUM 8.9   < > 9.0 9.3 9.5 10.0  CREATININE 0.85   < > 0.76 0.94 0.84 0.94  GFRNONAA >60  --  >60 >60  --  >60  GFRAA >60  --  >60 >60  --  >60   < > = values in this interval not displayed.    LIVER FUNCTION TESTS: Recent Labs    05/20/19 0750 02/25/20 0743 04/11/20 1438  BILITOT 0.5 0.5 0.5  AST _2 ALT _3 ALKPHOS 64 71 75  PROT 7.1 7.1 7.7  ALBUMIN 4.1 4.3 4.2     Assessment and Plan:  Right lip lesion s/p shave biopsy revealing plasma cell disorder. Plan for image-guided bone marrow biopsy/aspiration today in IR. Patient is NPO. Afebrile and WBCs WNL.  Risks and benefits discussed with the patient including, but not limited to bleeding, infection, damage to adjacent structures or low yield requiring additional tests. All of the patient's questions were answered, patient is agreeable to proceed. Consent signed and in chart.   Thank you for this interesting consult.  I greatly enjoyed meeting Jizelle Conkey  [place] and look forward to participating in their care.  A copy of this report was sent to the requesting provider on this date.  Electronically Signed: Earley Abide, PA-C 04/20/2020, 8:39 AM   I spent a total of 30 Minutes in face to face in clinical consultation, greater than 50% of which was counseling/coordinating care for plasma cell disorder/bone marrow biopsy and aspiration.

## 2020-04-20 NOTE — Procedures (Signed)
Interventional Radiology Procedure Note  Procedure: CT guided bone marrow aspiration and biopsy  Complications: None  EBL: < 10 mL  Findings: Aspirate and core biopsy performed of bone marrow in right iliac bone.  Plan: Bedrest supine x 1 hrs  Wendelin Reader T. Kano Heckmann, M.D Pager:  [phone]   

## 2020-04-20 NOTE — Discharge Instructions (Signed)
Moderate Conscious Sedation, Adult, Care After These instructions provide you with information about caring for yourself after your procedure. Your health care provider may also give you more specific instructions. Your treatment has been planned according to current medical practices, but problems sometimes occur. Call your health care provider if you have any problems or questions after your procedure. What can I expect after the procedure? After your procedure, it is common:  To feel sleepy for several hours.  To feel clumsy and have poor balance for several hours.  To have poor judgment for several hours.  To vomit if you eat too soon. Follow these instructions at home: For at least 24 hours after the procedure:   Do not: ? Participate in activities where you could fall or become injured. ? Drive. ? Use heavy machinery. ? Drink alcohol. ? Take sleeping pills or medicines that cause drowsiness. ? Make important decisions or sign legal documents. ? Take care of children on your own.  Rest. Eating and drinking  Follow the diet recommended by your health care provider.  If you vomit: ? Drink water, juice, or soup when you can drink without vomiting. ? Make sure you have little or no nausea before eating solid foods. General instructions  Have a responsible adult stay with you until you are awake and alert.  Take over-the-counter and prescription medicines only as told by your health care provider.  If you smoke, do not smoke without supervision.  Keep all follow-up visits as told by your health care provider. This is important. Contact a health care provider if:  You keep feeling nauseous or you keep vomiting.  You feel light-headed.  You develop a rash.  You have a fever. Get help right away if:  You have trouble breathing. This information is not intended to replace advice given to you by your health care provider. Make sure you discuss any questions you have  with your health care provider. Document Revised: 08/08/2017 Document Reviewed: 12/16/2015 Elsevier Patient Education  2020 [place]. Bone Marrow Aspiration and Bone Marrow Biopsy, Adult, Care After This sheet gives you information about how to care for yourself after your procedure. Your health care provider may also give you more specific instructions. If you have problems or questions, contact your health care provider. What can I expect after the procedure? After the procedure, it is common to have:  Mild pain and tenderness.  Swelling.  Bruising. Follow these instructions at home: Puncture site care   Follow instructions from your health care provider about how to take care of the puncture site. Make sure you: ? Wash your hands with soap and water before and after you change your bandage (dressing). If soap and water are not available, use hand sanitizer. ? Change your dressing as told by your health care provider.  Check your puncture site every day for signs of infection. Check for: ? More redness, swelling, or pain. ? Fluid or blood. ? Warmth. ? Pus or a bad smell. Activity  Return to your normal activities as told by your health care provider. Ask your health care provider what activities are safe for you.  Do not lift anything that is heavier than 10 lb (4.5 kg), or the limit that you are told, until your health care provider says that it is safe.  Do not drive for 24 hours if you were given a sedative during your procedure. General instructions   Take over-the-counter and prescription medicines only as told by your  health care provider.  Do not take baths, swim, or use a hot tub until your health care provider approves. Ask your health care provider if you may take showers. You may only be allowed to take sponge baths.  If directed, put ice on the affected area. To do this: ? Put ice in a plastic bag. ? Place a towel between your skin and the bag. ? Leave  the ice on for 20 minutes, 2-3 times a day.  Keep all follow-up visits as told by your health care provider. This is important. Contact a health care provider if:  Your pain is not controlled with medicine.  You have a fever.  You have more redness, swelling, or pain around the puncture site.  You have fluid or blood coming from the puncture site.  Your puncture site feels warm to the touch.  You have pus or a bad smell coming from the puncture site. Summary  After the procedure, it is common to have mild pain, tenderness, swelling, and bruising.  Follow instructions from your health care provider about how to take care of the puncture site and what activities are safe for you.  Take over-the-counter and prescription medicines only as told by your health care provider.  Contact a health care provider if you have any signs of infection, such as fluid or blood coming from the puncture site. This information is not intended to replace advice given to you by your health care provider. Make sure you discuss any questions you have with your health care provider. Document Revised: 01/12/2019 Document Reviewed: 01/12/2019 Elsevier Patient Education  [place].

## 2020-04-24 LAB — SURGICAL PATHOLOGY

## 2020-04-25 ENCOUNTER — Ambulatory Visit (HOSPITAL_COMMUNITY)
Admission: RE | Admit: 2020-04-25 | Discharge: 2020-04-25 | Disposition: A | Discharge status: Home / Self Care | Payer: BC Managed Care – PPO | Source: Ambulatory Visit | Attending: Oncology | Admitting: Oncology

## 2020-04-25 ENCOUNTER — Other Ambulatory Visit: Payer: Self-pay

## 2020-04-25 DIAGNOSIS — E785 Hyperlipidemia, unspecified: Secondary | ICD-10-CM | POA: Insufficient documentation

## 2020-04-25 DIAGNOSIS — I429 Cardiomyopathy, unspecified: Secondary | ICD-10-CM | POA: Diagnosis not present

## 2020-04-25 DIAGNOSIS — E119 Type 2 diabetes mellitus without complications: Secondary | ICD-10-CM | POA: Insufficient documentation

## 2020-04-25 DIAGNOSIS — D729 Disorder of white blood cells, unspecified: Secondary | ICD-10-CM | POA: Diagnosis not present

## 2020-04-25 LAB — ECHOCARDIOGRAM COMPLETE
Area-P 1/2: 4.31 cm2
Calc EF: 54.5 %
S' Lateral: 3.1 cm
Single Plane A2C EF: 53.1 %
Single Plane A4C EF: 55.8 %

## 2020-04-25 NOTE — Progress Notes (Signed)
  Echocardiogram 2D Echocardiogram has been performed.  Miranda Greene 04/25/2020, 9:53 AM

## 2020-05-08 ENCOUNTER — Ambulatory Visit: Payer: BC Managed Care – PPO | Admitting: Primary Care

## 2020-05-08 ENCOUNTER — Inpatient Hospital Stay (HOSPITAL_BASED_OUTPATIENT_CLINIC_OR_DEPARTMENT_OTHER): Payer: BC Managed Care – PPO | Admitting: Oncology

## 2020-05-08 ENCOUNTER — Other Ambulatory Visit: Payer: Self-pay

## 2020-05-08 VITALS — BP 145/73 | HR 92 | Temp 96.7°F | Resp 18 | Ht 63.0 in | Wt 139.4 lb

## 2020-05-08 DIAGNOSIS — K219 Gastro-esophageal reflux disease without esophagitis: Secondary | ICD-10-CM | POA: Diagnosis not present

## 2020-05-08 DIAGNOSIS — E119 Type 2 diabetes mellitus without complications: Secondary | ICD-10-CM | POA: Diagnosis not present

## 2020-05-08 DIAGNOSIS — Z8249 Family history of ischemic heart disease and other diseases of the circulatory system: Secondary | ICD-10-CM | POA: Diagnosis not present

## 2020-05-08 DIAGNOSIS — E785 Hyperlipidemia, unspecified: Secondary | ICD-10-CM | POA: Diagnosis not present

## 2020-05-08 DIAGNOSIS — Z791 Long term (current) use of non-steroidal anti-inflammatories (NSAID): Secondary | ICD-10-CM | POA: Diagnosis not present

## 2020-05-08 DIAGNOSIS — Z79899 Other long term (current) drug therapy: Secondary | ICD-10-CM | POA: Diagnosis not present

## 2020-05-08 DIAGNOSIS — D729 Disorder of white blood cells, unspecified: Secondary | ICD-10-CM | POA: Diagnosis not present

## 2020-05-08 DIAGNOSIS — M329 Systemic lupus erythematosus, unspecified: Secondary | ICD-10-CM | POA: Diagnosis not present

## 2020-05-08 DIAGNOSIS — Z794 Long term (current) use of insulin: Secondary | ICD-10-CM | POA: Diagnosis not present

## 2020-05-08 DIAGNOSIS — Z9013 Acquired absence of bilateral breasts and nipples: Secondary | ICD-10-CM | POA: Diagnosis not present

## 2020-05-08 DIAGNOSIS — Z801 Family history of malignant neoplasm of trachea, bronchus and lung: Secondary | ICD-10-CM | POA: Diagnosis not present

## 2020-05-08 DIAGNOSIS — C9030 Solitary plasmacytoma not having achieved remission: Secondary | ICD-10-CM | POA: Diagnosis not present

## 2020-05-08 DIAGNOSIS — Z86000 Personal history of in-situ neoplasm of breast: Secondary | ICD-10-CM | POA: Diagnosis not present

## 2020-05-08 DIAGNOSIS — Z8261 Family history of arthritis: Secondary | ICD-10-CM | POA: Diagnosis not present

## 2020-05-08 DIAGNOSIS — Z833 Family history of diabetes mellitus: Secondary | ICD-10-CM | POA: Diagnosis not present

## 2020-05-08 NOTE — Telephone Encounter (Signed)
Noted, will evaluate. 

## 2020-05-08 NOTE — Progress Notes (Signed)
Hematology and Oncology Follow Up Visit  Miranda Greene [id number] 1956/06/19 64 y.o. 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  Her work-up does not reveal any evidence of systemic disease.   Prior Therapy:   She is status post skin biopsy completed on July 7 of a mouth lesion.  She is status post bone marrow biopsy completed on April 20, 2020 without any evidence of amyloidosis or plasma cell disorder.  Current therapy: Under evaluation for possible treatment.  Interim History: Miranda Greene returns today for a follow-up evaluation.  Since the last visit, she underwent laboratory testing and bone marrow biopsy to complete her work-up regarding possible plasma cell disorder.  She does report a recent urinary tract infection but no other complaints.  She does have arm pain but no other bone pain.  Continues to be active and attends to activities of daily living.     Medications: I have reviewed the patient's current medications.  Current Outpatient Medications  Medication Sig Dispense Refill  . ibuprofen (ADVIL,MOTRIN) 200 MG tablet Take 200 mg by mouth every 6 (six) hours as needed for moderate pain.     . Insulin Glargine (LANTUS SOLOSTAR) 100 UNIT/ML Solostar Pen Inject 20 Units into the skin at bedtime. 15 mL 3  . metFORMIN (GLUCOPHAGE) 1000 MG tablet Take 1 tablet (1,000 mg total) by mouth 2 (two) times daily with a meal. For diabetes. 180 tablet 3  . omeprazole (PRILOSEC) 20 MG capsule Take 20 mg by mouth daily.     No current facility-administered medications for this visit.     Allergies:  Allergies  Allergen Reactions  . Meloxicam Hives  . Invokana [Canagliflozin] Rash     Physical Exam: Blood pressure (!) 145/73, pulse 92, temperature (!) 96.7 F (35.9 C), temperature source  Tympanic, resp. rate 18, height 5' 3"  (1.6 m), weight 139 lb 6.4 oz (63.2 kg), SpO2 97 %.   ECOG: 0   General appearance: Comfortable appearing without any discomfort Head: Normocephalic without any trauma Oropharynx: Mild erythema noted the oropharynx.  The angle of the mouth noted some irritation bilaterally. Eyes: Pupils are equal and round reactive to light. Lymph nodes: No cervical, supraclavicular, inguinal or axillary lymphadenopathy.   Heart:regular rate and rhythm.  S1 and S2 without leg edema. Lung: Clear without any rhonchi or wheezes.  No dullness to percussion. Abdomin: Soft, nontender, nondistended with good bowel sounds.  No hepatosplenomegaly. Musculoskeletal: No joint deformity or effusion.  Full range of motion noted. Neurological: No deficits noted on motor, sensory and deep tendon reflex exam. Skin: No petechial rash or dryness.  Appeared moist.      Lab Results: Lab Results  Component Value Date   WBC 5.0 04/20/2020   HGB 12.3 04/20/2020   HCT 37.2 04/20/2020   MCV 91.9 04/20/2020   PLT 267 04/20/2020     Chemistry      Component Value Date/Time   NA 137 04/11/2020 1438   K 4.1 04/11/2020 1438   CL 103 04/11/2020 1438   CO2 24 04/11/2020 1438   BUN 14 04/11/2020 1438   CREATININE 0.94 04/11/2020 1438      Component Value Date/Time   CALCIUM 10.0 04/11/2020 1438   ALKPHOS 75 04/11/2020 1438   AST 16 04/11/2020 1438   ALT 11 04/11/2020 1438   BILITOT 0.5 04/11/2020  1438     Results for Miranda Greene, Miranda "TERRY" (MRN [id number]) as of 05/08/2020 10:50  Ref. Range 04/11/2020 14:38 04/11/2020 14:40  IgG (Immunoglobin G), Serum Latest Ref Range: 586 - 1,602 mg/dL  1,029  IgM (Immunoglobulin M), Srm Latest Ref Range: 26 - 217 mg/dL  48  IgA Latest Ref Range: 87 - 352 mg/dL  411 (H)  Kappa free light chain Latest Ref Range: 3.3 - 19.4 mg/L 28.8 (H)   Lamda free light chains Latest Ref Range: 5.7 - 26.3 mg/L 18.6   Kappa, lamda light chain ratio Latest Ref  Range: 0.26 - 1.65  1.55     Results for Miranda Greene, Miranda "TERRY" (MRN [id number]) as of 05/08/2020 10:50  Ref. Range 04/17/2020 08:01  ALBUMIN, U Latest Units: % 27.9  ALPHA 1 URINE Latest Units: % 2.5  Alpha 2, Urine Latest Units: % 4.7  % BETA, Urine Latest Units: % 22.4  Free Kappa Lt Chains,Ur Latest Ref Range: 0.63 - 113.79 mg/L 13.62  Free Kappa/Lambda Ratio Latest Ref Range: 1.03 - 31.76  8.90  Free Lambda Lt Chains,Ur Latest Ref Range: 0.47 - 11.77 mg/L 1.53  GAMMA GLOBULIN URINE Latest Units: % 42.5  Immunofixation Result, Urine Unknown Comment  M-SPIKE %, Urine Latest Ref Range: Not Observed % Not Observed  Total Protein, Urine-Ur/day Latest Ref Range: 30 - 150 mg/24 hr 150  Total Protein, Urine-UPE24 Latest Ref Range: Not Estab. mg/dL 13.6    Impression and Plan:   64 year old woman with:  1.    Dermatological manifestation of amyloid detected in July 2021 with an isolated right lower lip lesion.  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    She has completed her work-up at this time and this was discussed today in detail.  Her serum protein electrophoresis does not pick up on any M spike with immunofixation showed a polyclonal increase.  Her IgA quantitative immunoglobulins are mildly elevated at 411 with normal IgG and IgM.  Urine protein electrophoresis was unremarkable.  Serum light chains showed a mildly elevated kappa 28.8, normal lambda and normal kappa to lambda ratio.  Her bone marrow biopsy failed to show any evidence of increased plasma cell disorder with plasma cells of 3%.   These findings were discussed today in detail and the differential diagnosis was reviewed.  Systemic amyloidosis can still be a possibility versus secondary amyloid related to a autoimmune disorder.  She does report bone pain in her arm and will complete the work-up with a skeletal survey.  Her PET scan was not approved is of lack of  indication at this time.  After completing her skeletal survey, evaluation at plasma cell disorder clinic at a tertiary care facility would be reasonable given her unusual presentation.  Rheumatology evaluation will be also reasonable to rule out a autoimmune disorder.  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  She is up-to-date on her mammography.  3.  Follow-up: Will be in the next few weeks to follow her progress.   30  minutes were spent on this encounter.  The time was dedicated to reviewing laboratory data, discussing differential diagnosis and management options for the future.      Zola Button, MD 8/30/202110:49 AM

## 2020-05-08 NOTE — Telephone Encounter (Signed)
Patient called and said she can't come at 2:00 today.  The only time patient could come is on 05/12/20 at 2:40.

## 2020-05-09 ENCOUNTER — Telehealth: Payer: Self-pay | Admitting: Oncology

## 2020-05-09 NOTE — Telephone Encounter (Signed)
Scheduled per 08/30 los, patient has been called and notified. 

## 2020-05-10 ENCOUNTER — Ambulatory Visit (HOSPITAL_COMMUNITY)
Admission: RE | Admit: 2020-05-10 | Discharge: 2020-05-10 | Disposition: A | Discharge status: Home / Self Care | Payer: BC Managed Care – PPO | Source: Ambulatory Visit | Attending: Oncology | Admitting: Oncology

## 2020-05-10 ENCOUNTER — Other Ambulatory Visit: Payer: Self-pay

## 2020-05-10 DIAGNOSIS — D729 Disorder of white blood cells, unspecified: Secondary | ICD-10-CM | POA: Diagnosis not present

## 2020-05-10 DIAGNOSIS — E8809 Other disorders of plasma-protein metabolism, not elsewhere classified: Secondary | ICD-10-CM | POA: Diagnosis not present

## 2020-05-12 ENCOUNTER — Ambulatory Visit (INDEPENDENT_AMBULATORY_CARE_PROVIDER_SITE_OTHER): Payer: BC Managed Care – PPO | Admitting: Primary Care

## 2020-05-12 ENCOUNTER — Other Ambulatory Visit: Payer: Self-pay

## 2020-05-12 ENCOUNTER — Telehealth: Payer: Self-pay | Admitting: *Deleted

## 2020-05-12 ENCOUNTER — Encounter: Payer: Self-pay | Admitting: Primary Care

## 2020-05-12 VITALS — BP 128/80 | HR 81 | Ht 63.0 in | Wt 140.0 lb

## 2020-05-12 DIAGNOSIS — R300 Dysuria: Secondary | ICD-10-CM | POA: Insufficient documentation

## 2020-05-12 LAB — POCT URINALYSIS DIPSTICK
Bilirubin, UA: NEGATIVE
Blood, UA: NEGATIVE
Glucose, UA: POSITIVE — AB
Ketones, UA: NEGATIVE
Leukocytes, UA: NEGATIVE
Nitrite, UA: NEGATIVE
Protein, UA: NEGATIVE
Spec Grav, UA: 1.01 (ref 1.010–1.025)
Urobilinogen, UA: 0.2 E.U./dL
pH, UA: 6 (ref 5.0–8.0)

## 2020-05-12 NOTE — Patient Instructions (Signed)
We will be in touch once we receive your urine culture results.  Be sure to avoid spicy food, alcohol, caffeine which can irritate the bladder.  It was a pleasure to see you today!

## 2020-05-12 NOTE — Progress Notes (Signed)
Subjective:    Patient ID: Miranda Greene, female    DOB: 1956-01-06, 64 y.o.   MRN: [id number]  HPI  This visit occurred during the SARS-CoV-2 public health emergency.  Safety protocols were in place, including screening questions prior to the visit, additional usage of staff PPE, and extensive cleaning of exam room while observing appropriate contact time as indicated for disinfecting solutions.   Miranda Greene is a 64 year old female with a history of type 2 diabetes, breast cancer, GERD, hyperlipidemia who presents today with a chief complaint of bladder spasms.  When she urinates she feels like her bladder is "closing in" which will sometimes send chills up her spine. She also reports dark urine, urinary urgency, urinary frequency, dysuria. Symptoms began about 2-3 weeks ago.  She's taken a few medications OTC without improvement. She is checking her blood sugars every morning which are ranging 120's. 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   Review of Systems  Constitutional: Positive for chills. Negative for fever.  Gastrointestinal: Negative for abdominal pain and nausea.  Genitourinary: Positive for dysuria, frequency and urgency. Negative for hematuria and vaginal discharge.       Past Medical History:  Diagnosis Date   Arthritis    Hands, feet   Cancer ([place])    breast   Diabetes mellitus    GERD (gastroesophageal reflux disease)    Hyperlipidemia    Lupus ([place])      Social History   Socioeconomic History   Marital status: Married    Spouse name: Not on file   Number of children: Not on file   Years of education: Not on file   Highest education level: Not on file  Occupational History   Not on file  Tobacco Use   Smoking status: Never Smoker   Smokeless tobacco: Never Used  Vaping Use   Vaping Use: Never used  Substance and Sexual Activity    Alcohol use: No   Drug use: No   Sexual activity: Not on file  Other Topics Concern   Not on file  Social History Narrative   Married.   4 children.    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   Social Determinants of Health   Financial Resource Strain:    Difficulty of Paying Living Expenses: Not on file  Food Insecurity:    Worried About Charity fundraiser in the Last Year: Not on file   [employer] of Food in the Last Year: Not on file  Transportation Needs:    Lack of Transportation (Medical): Not on file   Lack of Transportation (Non-Medical): Not on file  Physical Activity:    Days of Exercise per Week: Not on file   Minutes of Exercise per Session: Not on file  Stress:    Feeling of Stress : Not on file  Social Connections:    Frequency of Communication with Friends and Family: Not on file   Frequency of Social Gatherings with Friends and Family: Not on file   Attends Religious Services: Not on file   Active Member of Clubs or Organizations: Not on file   Attends Archivist Meetings: Not on file   Marital Status: Not on file  Intimate Partner Violence:    Fear of Current or Ex-Partner: Not on  file   Emotionally Abused: Not on file   Physically Abused: Not on file   Sexually Abused: Not on file    Past Surgical History:  Procedure Laterality Date   ABDOMINAL HYSTERECTOMY     BLADDER SURGERY     BREAST EXCISIONAL BIOPSY Right    benign 2009   BREAST RECONSTRUCTION WITH PLACEMENT OF TISSUE EXPANDER AND FLEX HD (ACELLULAR HYDRATED DERMIS) Bilateral 06/21/2019   Procedure: BREAST RECONSTRUCTION WITH PLACEMENT OF TISSUE EXPANDER AND FLEX HD (ACELLULAR HYDRATED DERMIS);  Surgeon: Wallace Going, DO;  Location: [place];  Service: Plastics;  Laterality: Bilateral;   MASTECTOMY Bilateral 06/21/2019   MASTECTOMY W/ SENTINEL NODE BIOPSY Bilateral  06/21/2019   Procedure: BILATERAL MASTECTOMIES;  Surgeon: Coralie Keens, MD;  Location: [place];  Service: General;  Laterality: Bilateral;   REMOVAL OF BILATERAL TISSUE EXPANDERS WITH PLACEMENT OF BILATERAL BREAST IMPLANTS Bilateral 09/08/2019   Procedure: REMOVAL OF BILATERAL TISSUE EXPANDERS WITH PLACEMENT OF BILATERAL BREAST IMPLANTS;  Surgeon: Wallace Going, DO;  Location: [place];  Service: Plastics;  Laterality: Bilateral;   SENTINEL NODE BIOPSY Left 06/21/2019   Procedure: Left Sentinel Lymph Node Biopsy;  Surgeon: Coralie Keens, MD;  Location: [place];  Service: General;  Laterality: Left;    Family History  Problem Relation Age of Onset   Alcohol abuse Father    Lung cancer Father    Heart disease Father    Stroke Maternal Grandmother    Arthritis Paternal Grandmother    Heart disease Paternal Grandmother    Diabetes Paternal Grandmother    Arthritis Mother    Stroke Mother    Kidney disease Mother    Diabetes Mother    Dementia Mother     Allergies  Allergen Reactions   Meloxicam Hives   Invokana [Canagliflozin] Rash    Current Outpatient Medications on File Prior to Visit  Medication Sig Dispense Refill   ibuprofen (ADVIL,MOTRIN) 200 MG tablet Take 200 mg by mouth every 6 (six) hours as needed for moderate pain.      Insulin Glargine (LANTUS SOLOSTAR) 100 UNIT/ML Solostar Pen Inject 20 Units into the skin at bedtime. 15 mL 3   metFORMIN (GLUCOPHAGE) 1000 MG tablet Take 1 tablet (1,000 mg total) by mouth 2 (two) times daily with a meal. For diabetes. 180 tablet 3   omeprazole (PRILOSEC) 20 MG capsule Take 20 mg by mouth daily.     No current facility-administered medications on file prior to visit.    BP 128/80    Pulse 81    Ht 5\' 3"  (1.6 m)    Wt 140 lb (63.5 kg)    SpO2 98%    BMI 24.80 kg/m    Objective:   Physical Exam Constitutional:      Appearance: She is not ill-appearing.  Abdominal:     General:  Abdomen is flat.     Palpations: Abdomen is soft.     Tenderness: There is abdominal tenderness in the suprapubic area. There is no right CVA tenderness or left CVA tenderness.  Skin:    General: Skin is warm and dry.  Neurological:     Mental Status: She is alert.            Assessment & Plan:

## 2020-05-12 NOTE — Telephone Encounter (Signed)
Per Dr.Shadad, called [facility] for referral for pt to [facility]. All information was given and faxed over to [phone]

## 2020-05-12 NOTE — Assessment & Plan Note (Addendum)
Acute for the last three weeks. UA today with glucose, otherwise negative. Culture sent.  Await results.  Discussed the possibility of interstitial cystitis vs hyperglycemia contributing to symptoms. Avoid irritative food and drink. Continue to monitor glucose.

## 2020-05-13 LAB — URINE CULTURE
MICRO NUMBER:: 10910969
SPECIMEN QUALITY:: ADEQUATE

## 2020-05-18 DIAGNOSIS — D729 Disorder of white blood cells, unspecified: Secondary | ICD-10-CM | POA: Diagnosis not present

## 2020-05-22 ENCOUNTER — Other Ambulatory Visit: Payer: Self-pay | Admitting: Primary Care

## 2020-05-22 DIAGNOSIS — E119 Type 2 diabetes mellitus without complications: Secondary | ICD-10-CM

## 2020-05-24 DIAGNOSIS — E8589 Other amyloidosis: Secondary | ICD-10-CM | POA: Diagnosis not present

## 2020-05-28 DIAGNOSIS — M329 Systemic lupus erythematosus, unspecified: Secondary | ICD-10-CM | POA: Insufficient documentation

## 2020-05-29 DIAGNOSIS — L99 Other disorders of skin and subcutaneous tissue in diseases classified elsewhere: Secondary | ICD-10-CM | POA: Diagnosis not present

## 2020-05-29 DIAGNOSIS — E854 Organ-limited amyloidosis: Secondary | ICD-10-CM | POA: Diagnosis not present

## 2020-06-01 ENCOUNTER — Ambulatory Visit (INDEPENDENT_AMBULATORY_CARE_PROVIDER_SITE_OTHER): Payer: BC Managed Care – PPO | Admitting: Primary Care

## 2020-06-01 ENCOUNTER — Encounter: Payer: Self-pay | Admitting: Primary Care

## 2020-06-01 ENCOUNTER — Other Ambulatory Visit: Payer: Self-pay

## 2020-06-01 VITALS — BP 126/78 | HR 94 | Temp 98.5°F | Ht 63.0 in | Wt 139.0 lb

## 2020-06-01 DIAGNOSIS — E119 Type 2 diabetes mellitus without complications: Secondary | ICD-10-CM | POA: Diagnosis not present

## 2020-06-01 LAB — POCT GLYCOSYLATED HEMOGLOBIN (HGB A1C): Hemoglobin A1C: 7.1 % — AB (ref 4.0–5.6)

## 2020-06-01 MED ORDER — LANTUS SOLOSTAR 100 UNIT/ML ~~LOC~~ SOPN: 10.0000 [IU] | PEN_INJECTOR | Freq: Every day | SUBCUTANEOUS | 0 refills | Status: DC

## 2020-06-01 NOTE — Progress Notes (Signed)
Subjective:    Patient ID: Miranda Greene, female    DOB: [birth year], 64 y.o.   MRN: [id number]  HPI  This visit occurred during the SARS-CoV-2 public health emergency.  Safety protocols were in place, including screening questions prior to the visit, additional usage of staff PPE, and extensive cleaning of exam room while observing appropriate contact time as indicated for disinfecting solutions.   Miranda Greene is a 64 year old female with a history of breast cancer, type 2 diabetes, hyperlipidemia who presents today for follow up of diabetes.  Current medications include: Lantus 20 units HS, Metformin 1000 mg BID.   She is checking his/her blood glucose 1 times daily and is getting readings of:  AM fasting 70's to low 100's.  She will wake up with readings in the 70's and feel "shaky", will have to drink orange juice in order to feel better. 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 She is currently following with hematology/oncology through [facility].   Last A1C: 7.8 in June 2021, 7.1 today.  Last Eye Exam:  Last Foot Exam: Due Pneumonia Vaccination: Completed in 2017 ACE/ARB: None. Urine microalbumin UTD. Statin: None, has declined numerous times  BP Readings from Last 3 Encounters:  06/01/20 126/78  05/12/20 128/80  05/08/20 (!) 145/73   The 10-year ASCVD risk score Miranda Greene., et al., 2013) is: 8%   Values used to calculate the score:     Age: 42 years     Sex: Female     Is Non-Hispanic African American: No     Diabetic: Yes     Tobacco smoker: No     Systolic Blood Pressure: 244 mmHg     Is BP treated: No     HDL Cholesterol: 37.6 mg/dL     Total Cholesterol: 159 mg/dL   Review of Systems  Eyes: Negative for visual disturbance.  Respiratory: Negative for shortness of breath.   Cardiovascular: Negative for chest pain.  Neurological: Negative for dizziness and headaches.       Past Medical History:  Diagnosis Date  .  Arthritis    Hands, feet  . Cancer ([facility])    breast  . Diabetes mellitus   . GERD (gastroesophageal reflux disease)   . Hyperlipidemia   . Lupus ([place])      Social History   Socioeconomic History  . Marital status: Married    Spouse name: Not on file  . Number of children: Not on file  . Years of education: Not on file  . Highest education level: Not on file  Occupational History  . Not on file  Tobacco Use  . Smoking status: Never Smoker  . Smokeless tobacco: Never Used  Vaping Use  . Vaping Use: Never used  Substance and Sexual Activity  . Alcohol use: No  . Drug use: No  . Sexual activity: Not on file  Other Topics Concern  . Not on file  Social History Narrative   Married.   4 children.    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   Social Determinants of Health   Financial Resource Strain:   . Difficulty of Paying Living Expenses: Not on file  Food Insecurity:   . Worried About Charity fundraiser in the Last Year: Not on file  . Ran Out of Food in the Last Year: Not  on file  Transportation Needs:   . Lack of Transportation (Medical): Not on file  . Lack of Transportation (Non-Medical): Not on file  Physical Activity:   . Days of Exercise per Week: Not on file  . Minutes of Exercise per Session: Not on file  Stress:   . Feeling of Stress : Not on file  Social Connections:   . Frequency of Communication with Friends and Family: Not on file  . Frequency of Social Gatherings with Friends and Family: Not on file  . Attends Religious Services: Not on file  . Active Member of Clubs or Organizations: Not on file  . Attends Archivist Meetings: Not on file  . Marital Status: Not on file  Intimate Partner Violence:   . Fear of Current or Ex-Partner: Not on file  . Emotionally Abused: Not on file  . Physically Abused: Not on file  . Sexually Abused: Not on file    Past  Surgical History:  Procedure Laterality Date  . ABDOMINAL HYSTERECTOMY    . BLADDER SURGERY    . BREAST EXCISIONAL BIOPSY Right    benign 2009  . BREAST RECONSTRUCTION WITH PLACEMENT OF TISSUE EXPANDER AND FLEX HD (ACELLULAR HYDRATED DERMIS) Bilateral 06/21/2019   Procedure: BREAST RECONSTRUCTION WITH PLACEMENT OF TISSUE EXPANDER AND FLEX HD (ACELLULAR HYDRATED DERMIS);  Surgeon: Miranda Going, DO;  Location: [place];  Service: Plastics;  Laterality: Bilateral;  . MASTECTOMY Bilateral 06/21/2019  . MASTECTOMY W/ SENTINEL NODE BIOPSY Bilateral 06/21/2019   Procedure: BILATERAL MASTECTOMIES;  Surgeon: Miranda Keens, MD;  Location: [place];  Service: General;  Laterality: Bilateral;  . REMOVAL OF BILATERAL TISSUE EXPANDERS WITH PLACEMENT OF BILATERAL BREAST IMPLANTS Bilateral 09/08/2019   Procedure: REMOVAL OF BILATERAL TISSUE EXPANDERS WITH PLACEMENT OF BILATERAL BREAST IMPLANTS;  Surgeon: Miranda Going, DO;  Location: [place];  Service: Plastics;  Laterality: Bilateral;  . SENTINEL NODE BIOPSY Left 06/21/2019   Procedure: Left Sentinel Lymph Node Biopsy;  Surgeon: Miranda Keens, MD;  Location: [place];  Service: General;  Laterality: Left;    Family History  Problem Relation Age of Onset  . Alcohol abuse Father   . Lung cancer Father   . Heart disease Father   . Stroke Maternal Grandmother   . Arthritis Paternal Grandmother   . Heart disease Paternal Grandmother   . Diabetes Paternal Grandmother   . Arthritis Mother   . Stroke Mother   . Kidney disease Mother   . Diabetes Mother   . Dementia Mother     Allergies  Allergen Reactions  . Meloxicam Hives  . Invokana [Canagliflozin] Rash    Current Outpatient Medications on File Prior to Visit  Medication Sig Dispense Refill  . ibuprofen (ADVIL,MOTRIN) 200 MG tablet Take 200 mg by mouth every 6 (six) hours as needed for moderate pain.     . metFORMIN (GLUCOPHAGE) 1000 MG tablet Take 1 tablet  (1,000 mg total) by mouth 2 (two) times daily with a meal. For diabetes. 180 tablet 3  . omeprazole (PRILOSEC) 20 MG capsule Take 20 mg by mouth daily.     No current facility-administered medications on file prior to visit.    BP 126/78   Pulse 94   Temp 98.5 F (36.9 C) (Temporal)   Ht 5\' 3"  (1.6 m)   Wt 139 lb (63 kg)   SpO2 96%   BMI 24.62 kg/m    Objective:   Physical Exam Cardiovascular:     Rate and  Rhythm: Normal rate and regular rhythm.  Pulmonary:     Effort: Pulmonary effort is normal.     Breath sounds: Normal breath sounds.  Musculoskeletal:     Cervical back: Neck supple.  Skin:    General: Skin is warm and dry.            Assessment & Plan:

## 2020-06-01 NOTE — Patient Instructions (Addendum)
We've reduced your Lantus to 10 units nightly.  Continue metformin 1000 mg twice daily.  Be sure you are eating a healthy diet. Ensure you are consuming 64 ounces of water daily.  Monitor your blood sugars and notify me if you see readings consistently above 150.  Please schedule a follow up appointment in 6 months.   It was a pleasure to see you today!

## 2020-06-01 NOTE — Assessment & Plan Note (Signed)
Improved with A1C of 7.1 today, but she is dropping into the 70's with hypoglycemic symptoms.   Reduce Lantus to 10 units as she is not eating much. Continue metformin 1000 mg BID. Declines statin therapy again today. Urine microalbumin UTD. Pneumonia vaccination UTD. Foot exam today. Eye exam due.  Follow up in 6 months.

## 2020-06-12 ENCOUNTER — Ambulatory Visit: Payer: BC Managed Care – PPO | Admitting: Oncology

## 2020-06-27 ENCOUNTER — Ambulatory Visit: Payer: BC Managed Care – PPO | Admitting: Plastic Surgery

## 2020-07-17 ENCOUNTER — Other Ambulatory Visit: Payer: Self-pay

## 2020-07-17 ENCOUNTER — Ambulatory Visit (INDEPENDENT_AMBULATORY_CARE_PROVIDER_SITE_OTHER): Payer: BC Managed Care – PPO | Admitting: Plastic Surgery

## 2020-07-17 ENCOUNTER — Encounter: Payer: Self-pay | Admitting: Plastic Surgery

## 2020-07-17 VITALS — BP 125/90 | HR 93 | Temp 96.6°F | Ht 63.0 in | Wt 135.0 lb

## 2020-07-17 DIAGNOSIS — Z9889 Other specified postprocedural states: Secondary | ICD-10-CM

## 2020-07-17 DIAGNOSIS — N644 Mastodynia: Secondary | ICD-10-CM | POA: Insufficient documentation

## 2020-07-17 NOTE — Progress Notes (Addendum)
   Subjective:    Patient ID: Miranda Greene, female    DOB: [birth year], 64 y.o.   MRN: [id number]  The patient is a 64 year old female here for a follow-up visit on her bilateral breast reconstruction. She has implants in place. She complains of some tenderness on her left axilla, right axilla and left chest wall. Her implants are not causing any trouble and she is not had any issues with her implants. 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 It appears to be isolated to her oral cavity. More tests are being done and she is scheduled to see dermatologist at [facility]. We discussed the possibility of removing the implants which she does not really want to do right now. She is not interested in nipple areola tattoo at this time. The implants feel soft and I do not feel a capsular contracture. She has [place] high profile Xtra silicone implants 440 cc in each breast which were placed in September 08, 2019. The patient states she has an appointment with Dr. Ninfa Linden in the next few weeks. She is being seen at [facility] in [place] with oncology.     Review of Systems  Constitutional: Negative.  Negative for activity change and appetite change.  HENT: Negative.   Eyes: Negative.   Respiratory: Negative.  Negative for chest tightness.   Cardiovascular: Negative.   Endocrine: Negative.   Genitourinary: Negative.   Musculoskeletal: Negative.   Skin: Positive for rash.  Hematological: Negative.   Psychiatric/Behavioral: Negative.        Objective:   Physical Exam Vitals and nursing note reviewed.  Constitutional:      Appearance: Normal appearance.  HENT:     Head: Normocephalic and atraumatic.  Cardiovascular:     Rate and Rhythm: Normal rate.     Pulses: Normal pulses.  Pulmonary:     Effort: Pulmonary effort is normal.  Abdominal:     General: Abdomen is flat.  Neurological:     General: No focal deficit present.     Mental Status: She is alert and oriented  to person, place, and time.  Psychiatric:        Mood and Affect: Mood normal.        Behavior: Behavior normal.        Thought Content: Thought content normal.        Assessment & Plan:     ICD-10-CM   1. S/P breast reconstruction, bilateral  Z98.890     The patient has had the implants in place for a year. With her change in symptoms it is reasonable to get a ultrasound especially with her new amyloidosis diagnosis. We will plan on the ultrasound and I would like to see her after that.  Pictures were obtained of the patient and placed in the chart with the patient's or guardian's permission.

## 2020-07-18 ENCOUNTER — Ambulatory Visit: Payer: BC Managed Care – PPO | Admitting: Plastic Surgery

## 2020-07-19 ENCOUNTER — Other Ambulatory Visit: Payer: Self-pay

## 2020-07-19 ENCOUNTER — Ambulatory Visit
Admission: RE | Admit: 2020-07-19 | Discharge: 2020-07-19 | Disposition: A | Discharge status: Home / Self Care | Payer: BC Managed Care – PPO | Source: Ambulatory Visit | Attending: Plastic Surgery | Admitting: Plastic Surgery

## 2020-07-19 DIAGNOSIS — N644 Mastodynia: Secondary | ICD-10-CM

## 2020-07-19 DIAGNOSIS — Z9889 Other specified postprocedural states: Secondary | ICD-10-CM

## 2020-07-20 ENCOUNTER — Encounter: Payer: Self-pay | Admitting: Plastic Surgery

## 2020-07-22 LAB — HM DIABETES EYE EXAM

## 2020-08-07 DIAGNOSIS — L99 Other disorders of skin and subcutaneous tissue in diseases classified elsewhere: Secondary | ICD-10-CM | POA: Diagnosis not present

## 2020-08-07 DIAGNOSIS — E854 Organ-limited amyloidosis: Secondary | ICD-10-CM | POA: Diagnosis not present

## 2020-08-07 DIAGNOSIS — R197 Diarrhea, unspecified: Secondary | ICD-10-CM | POA: Diagnosis not present

## 2020-08-21 DIAGNOSIS — D0512 Intraductal carcinoma in situ of left breast: Secondary | ICD-10-CM | POA: Diagnosis not present

## 2020-08-22 DIAGNOSIS — E8589 Other amyloidosis: Secondary | ICD-10-CM | POA: Diagnosis not present

## 2020-08-22 DIAGNOSIS — E859 Amyloidosis, unspecified: Secondary | ICD-10-CM | POA: Diagnosis not present

## 2020-08-22 DIAGNOSIS — K130 Diseases of lips: Secondary | ICD-10-CM | POA: Diagnosis not present

## 2020-09-11 DIAGNOSIS — Z1211 Encounter for screening for malignant neoplasm of colon: Secondary | ICD-10-CM | POA: Diagnosis not present

## 2020-09-11 DIAGNOSIS — Z8639 Personal history of other endocrine, nutritional and metabolic disease: Secondary | ICD-10-CM | POA: Diagnosis not present

## 2020-09-11 DIAGNOSIS — E859 Amyloidosis, unspecified: Secondary | ICD-10-CM | POA: Diagnosis not present

## 2020-09-11 DIAGNOSIS — K529 Noninfective gastroenteritis and colitis, unspecified: Secondary | ICD-10-CM | POA: Diagnosis not present

## 2020-09-11 DIAGNOSIS — Z872 Personal history of diseases of the skin and subcutaneous tissue: Secondary | ICD-10-CM | POA: Diagnosis not present

## 2020-09-11 LAB — HM COLONOSCOPY

## 2020-10-02 DIAGNOSIS — L99 Other disorders of skin and subcutaneous tissue in diseases classified elsewhere: Secondary | ICD-10-CM | POA: Diagnosis not present

## 2020-10-02 DIAGNOSIS — E854 Organ-limited amyloidosis: Secondary | ICD-10-CM | POA: Diagnosis not present

## 2020-10-03 ENCOUNTER — Other Ambulatory Visit: Payer: Self-pay | Admitting: Primary Care

## 2020-10-03 DIAGNOSIS — E119 Type 2 diabetes mellitus without complications: Secondary | ICD-10-CM

## 2020-10-04 IMAGING — CT CT BIOPSY
1 of 2 series · 15 of 28 positions shown, 19 images · non-contrast
Comparison: none

CLINICAL DATA: Plasma cell lesion of the lip and workup for
possible underlying plasma cell neoplasm/myeloma.

[Series 2: i-spiral 5.0 br40 · axial · 0.85mm/px · z∈[-121,-54]mm · 15 of 22 slices shown, 19 images]
[im 2/22  mediastinal]
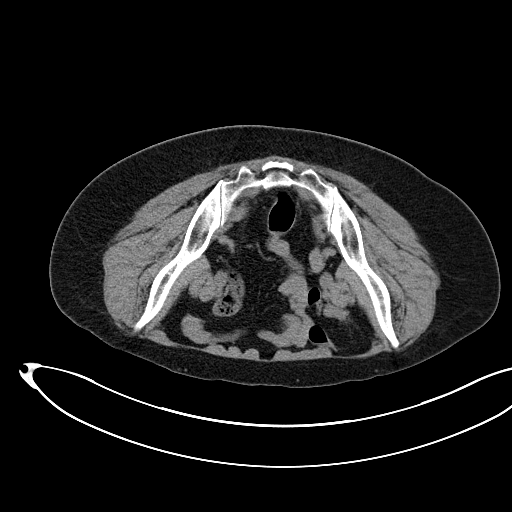
[im 2/22  lung]
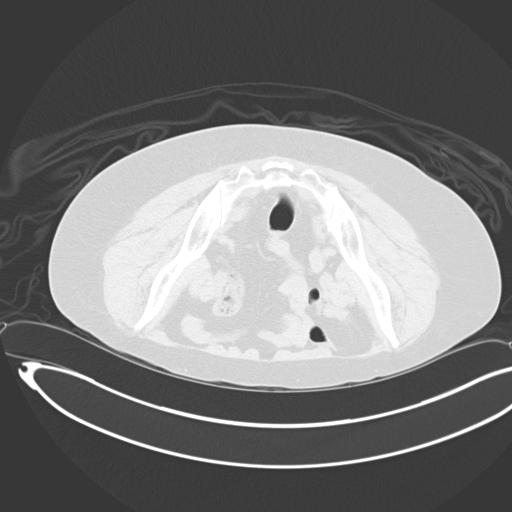
[im 4/22  lung]
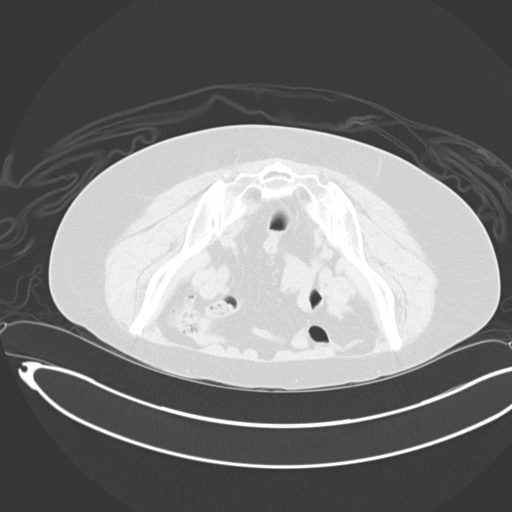
[im 5/22  lung]
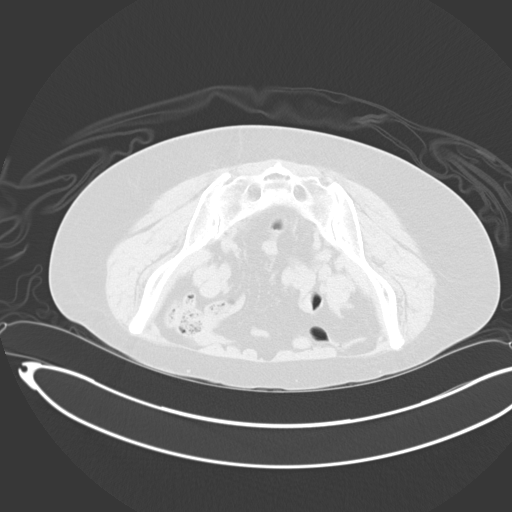
[im 6/22  lung]
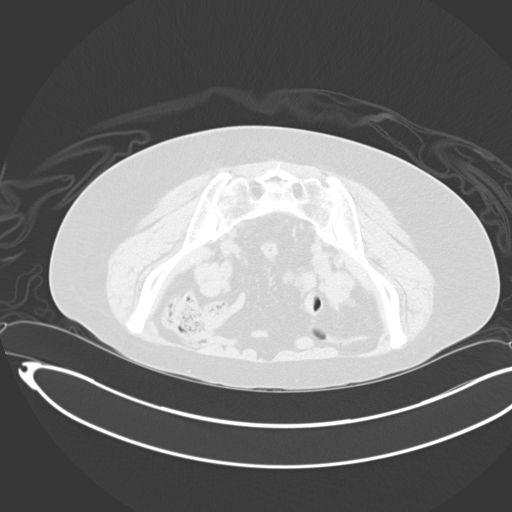
[im 8/22  mediastinal]
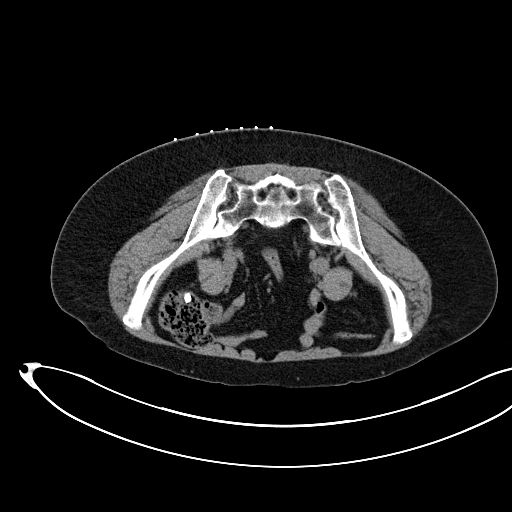
[im 8/22  lung]
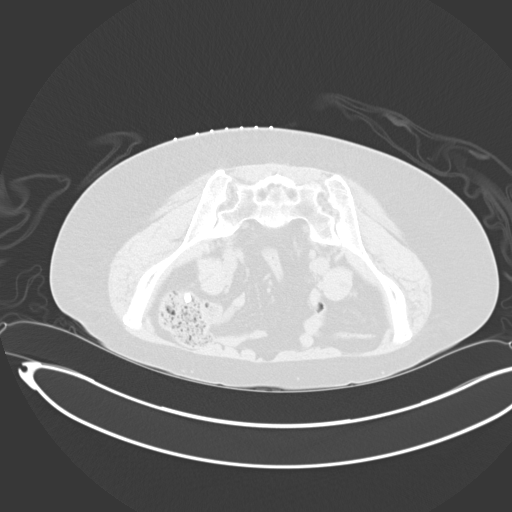
[im 9/22  lung]
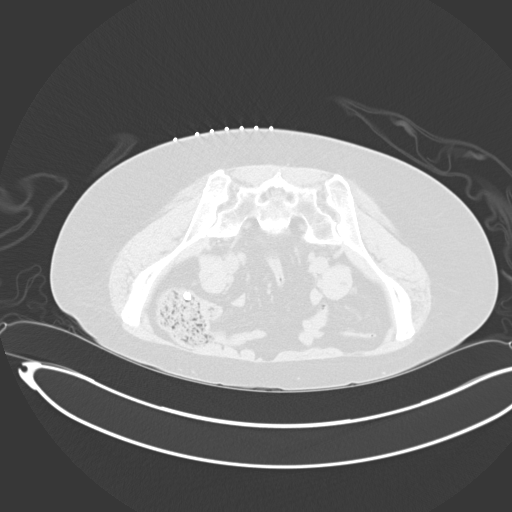
[im 10/22  lung]
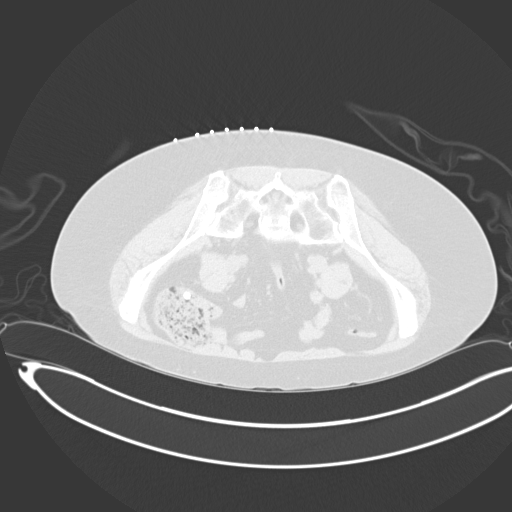
[im 12/22  lung]
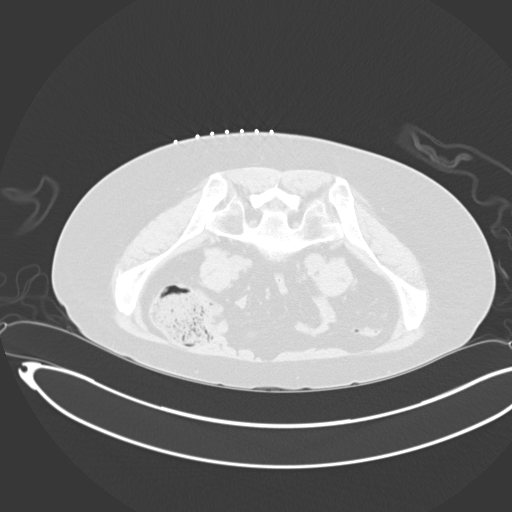
[im 13/22  mediastinal]
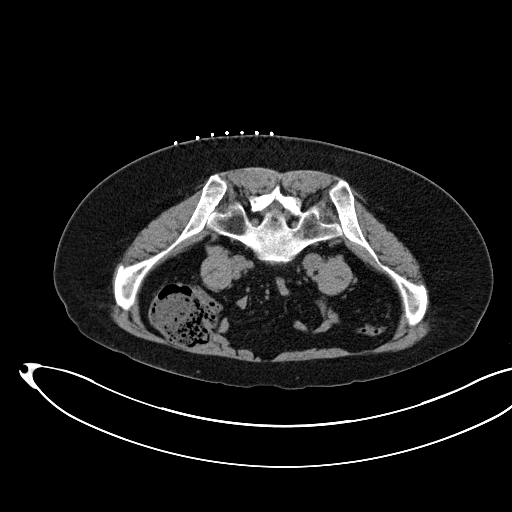
[im 13/22  lung]
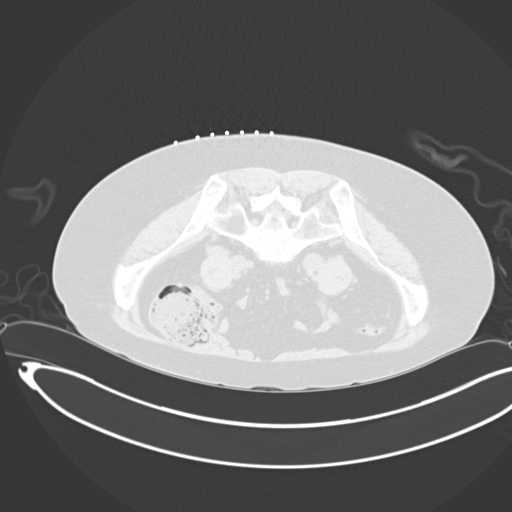
[im 14/22  lung]
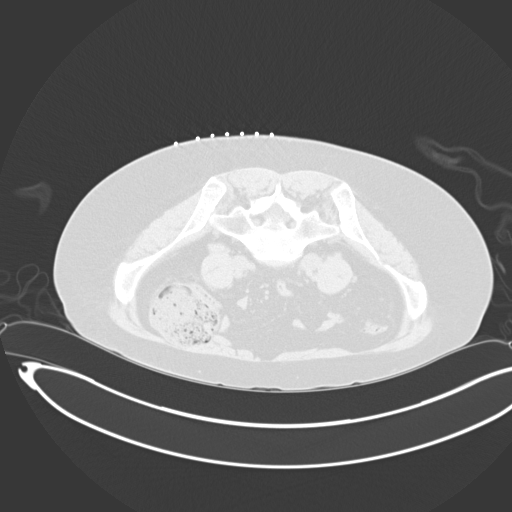
[im 16/22  lung]
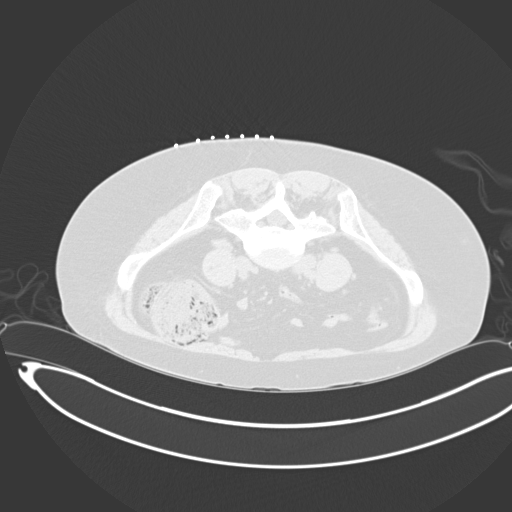
[im 17/22  lung]
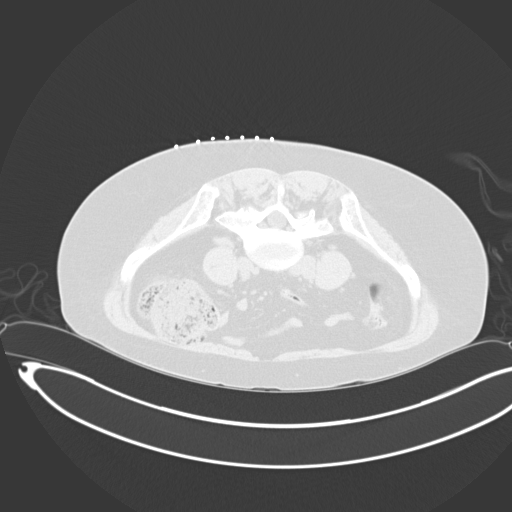
[im 18/22  mediastinal]
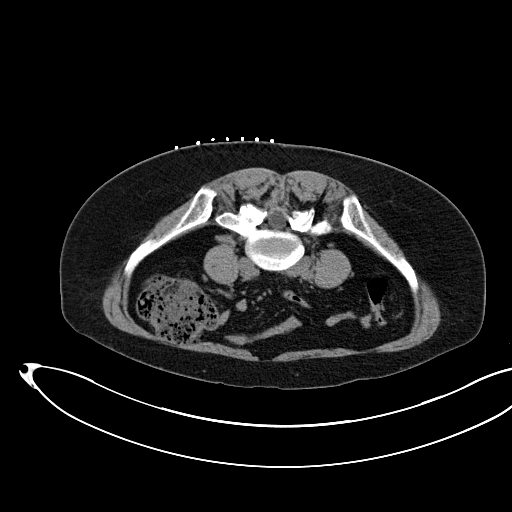
[im 18/22  lung]
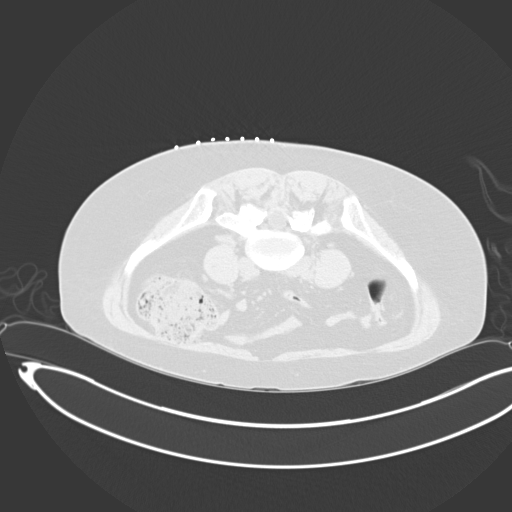
[im 20/22  lung]
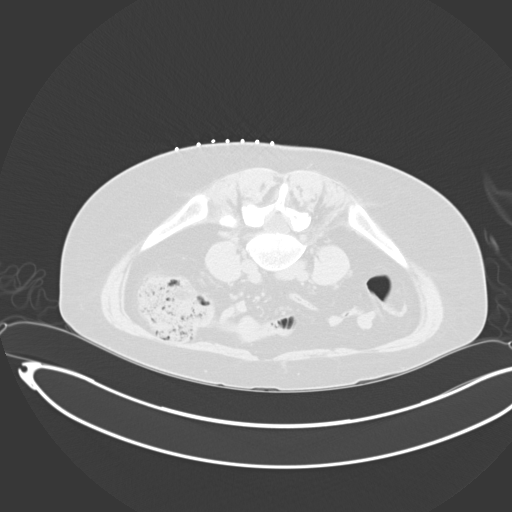
[im 21/22  lung]
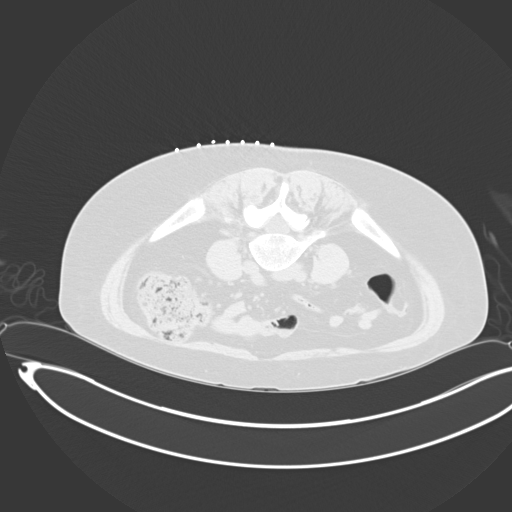

[15 of 28 positions shown; findings below may reference images not displayed]

EXAM:
CT GUIDED BONE MARROW ASPIRATION AND BIOPSY

ANESTHESIA/SEDATION:
Versed 2.0 mg IV, Fentanyl 100 mcg IV

Total Moderate Sedation Time:   13 minutes.

The patient's level of consciousness and physiologic status were
continuously monitored during the procedure by Radiology nursing.

PROCEDURE:
The procedure risks, benefits, and alternatives were explained to
the patient. Questions regarding the procedure were encouraged and
answered. The patient understands and consents to the procedure. A
time out was performed prior to initiating the procedure.

The right gluteal region was prepped with chlorhexidine. Sterile
gown and sterile gloves were used for the procedure. Local
anesthesia was provided with 1% Lidocaine.

Under CT guidance, an 11 gauge On Control bone cutting needle was
advanced from a posterior approach into the right iliac bone. Needle
positioning was confirmed with CT. Initial non heparinized and
heparinized aspirate samples were obtained of bone marrow. Core
biopsy was performed via the On Control drill needle.

COMPLICATIONS:
None
FINDINGS: Inspection of initial aspirate did reveal visible particles. Intact
core biopsy sample was obtained.
IMPRESSION: CT guided bone marrow biopsy of right posterior iliac bone with both
aspirate and core samples obtained.

## 2020-11-15 ENCOUNTER — Telehealth: Payer: Self-pay | Admitting: Primary Care

## 2020-11-15 NOTE — Telephone Encounter (Signed)
Absolutely, thanks!

## 2020-11-15 NOTE — Telephone Encounter (Signed)
Would this be ok with you? EM

## 2020-11-15 NOTE — Telephone Encounter (Signed)
Yes... but no more  than one new or TOC a week.. so it may have to be pushed out. Thanks!

## 2020-11-15 NOTE — Telephone Encounter (Signed)
Patient would like to know if she could do a TOC to Dr Diona Browner?  Please advise. EM

## 2020-11-16 NOTE — Telephone Encounter (Signed)
Per DPR I was able to leave a detailed message for the patient. Needs to schedule TOC appt. EM

## 2020-11-29 ENCOUNTER — Ambulatory Visit: Payer: BC Managed Care – PPO | Admitting: Primary Care

## 2020-12-08 ENCOUNTER — Encounter: Payer: Self-pay | Admitting: Family Medicine

## 2020-12-08 ENCOUNTER — Other Ambulatory Visit: Payer: Self-pay

## 2020-12-08 ENCOUNTER — Ambulatory Visit (INDEPENDENT_AMBULATORY_CARE_PROVIDER_SITE_OTHER): Payer: BC Managed Care – PPO | Admitting: Family Medicine

## 2020-12-08 VITALS — BP 122/62 | HR 92 | Temp 98.1°F | Ht 63.0 in | Wt 138.2 lb

## 2020-12-08 DIAGNOSIS — E854 Organ-limited amyloidosis: Secondary | ICD-10-CM | POA: Diagnosis not present

## 2020-12-08 DIAGNOSIS — E119 Type 2 diabetes mellitus without complications: Secondary | ICD-10-CM | POA: Diagnosis not present

## 2020-12-08 DIAGNOSIS — Z853 Personal history of malignant neoplasm of breast: Secondary | ICD-10-CM | POA: Diagnosis not present

## 2020-12-08 DIAGNOSIS — E785 Hyperlipidemia, unspecified: Secondary | ICD-10-CM

## 2020-12-08 DIAGNOSIS — M79601 Pain in right arm: Secondary | ICD-10-CM | POA: Insufficient documentation

## 2020-12-08 DIAGNOSIS — L99 Other disorders of skin and subcutaneous tissue in diseases classified elsewhere: Secondary | ICD-10-CM

## 2020-12-08 DIAGNOSIS — K219 Gastro-esophageal reflux disease without esophagitis: Secondary | ICD-10-CM

## 2020-12-08 DIAGNOSIS — M329 Systemic lupus erythematosus, unspecified: Secondary | ICD-10-CM

## 2020-12-08 DIAGNOSIS — E1169 Type 2 diabetes mellitus with other specified complication: Secondary | ICD-10-CM

## 2020-12-08 LAB — POCT GLYCOSYLATED HEMOGLOBIN (HGB A1C): Hemoglobin A1C: 8.3 % — AB (ref 4.0–5.6)

## 2020-12-08 NOTE — Assessment & Plan Note (Signed)
S/P  Double mastectomy... no radiation or chemo was needed. Followed by Alen Blew originally.. then changed to Oncologist at [facility].

## 2020-12-08 NOTE — Assessment & Plan Note (Signed)
Followed at [facility] by Oncologist  S/P ECHO, bone marrow biopsy... no involvement. Treated with oral chemo.. completed in early 09/2020

## 2020-12-08 NOTE — Progress Notes (Signed)
Patient ID: Miranda Greene, female    DOB: [birth year], 65 y.o.   MRN: [id number]  This visit was conducted in person.  BP 122/62   Pulse 92   Temp 98.1 F (36.7 C) (Temporal)   Ht _0  (1.6 m)   Wt 138 lb 4 oz (62.7 kg)   SpO2 97%   BMI 24.49 kg/m    CC:  Chief Complaint  Patient presents with  . Establish Care    TOC from Gentry Fitz    Subjective:   HPI: Miranda Greene is a 65 y.o. female presenting on 12/08/2020 for Establish Care (TOC from Gentry Fitz)   Last CPX: 02/2020   She had breast cancer dx in 04/2019.. S/P  Double mastectomy... no radiation or chemo was needed. Followed by Alen Blew originally.. then changed to Oncologist at [facility].  Diabetes:   She is using Lantus 10 units daily, metformin 1000 mg BID.  She as poor appetite and does not eat much.  She denies depression or anxiety. Lab Results  Component Value Date   HGBA1C 8.3 (A) 12/08/2020  Using medications without difficulties: Hypoglycemic episodes: yes Hyperglycemic episodes: Feet problems: none Blood Sugars averaging: eye exam within last year:  Elevated Cholesterol:  Lab Results  Component Value Date   CHOL 159 02/25/2020   HDL 37.60 (L) 02/25/2020   LDLCALC 96 02/25/2020   TRIG 123.0 02/25/2020   CHOLHDL 4 02/25/2020  Using medications without problems: Muscle aches:  Diet compliance: Exercise: Other complaints:  Lupus.. not seeing any MD reguarding this.   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 Followed at [facility] by Oncologist     right upper arm pain x 2 years.Marland Kitchen after pulled by dog.  Relevant past medical, surgical, family and social history reviewed and updated as indicated. Interim medical history since our last visit reviewed. Allergies and medications reviewed and updated. Outpatient Medications Prior to Visit  Medication Sig Dispense Refill  . ibuprofen (ADVIL,MOTRIN) 200 MG tablet Take 200 mg by mouth every 6 (six) hours as needed for moderate pain.     Marland Kitchen insulin glargine  (LANTUS SOLOSTAR) 100 UNIT/ML Solostar Pen Inject 10 Units into the skin at bedtime. 30 mL 0  . metFORMIN (GLUCOPHAGE) 1000 MG tablet TAKE 1 TABLET BY MOUTH TWICE DAILY WITH A MEAL FOR DIABETES 180 tablet 2  . omeprazole (PRILOSEC) 20 MG capsule Take 20 mg by mouth daily.     No facility-administered medications prior to visit.     Per HPI unless specifically indicated in ROS section below Review of Systems  Constitutional: Negative for fatigue and fever.  HENT: Negative for congestion.   Eyes: Negative for pain.  Respiratory: Negative for cough and shortness of breath.   Cardiovascular: Negative for chest pain, palpitations and leg swelling.  Gastrointestinal: Negative for abdominal pain.  Genitourinary: Negative for dysuria and vaginal bleeding.  Musculoskeletal: Negative for back pain.  Neurological: Negative for syncope, light-headedness and headaches.  Psychiatric/Behavioral: Negative for dysphoric mood.   Objective:  BP 122/62   Pulse 92   Temp 98.1 F (36.7 C) (Temporal)   Ht _1  (1.6 m)   Wt 138 lb 4 oz (62.7 kg)   SpO2 97%   BMI 24.49 kg/m   Wt Readings from Last 3 Encounters:  12/08/20 138 lb 4 oz (62.7 kg)  07/17/20 135 lb (61.2 kg)  06/01/20 139 lb (63 kg)      Physical Exam Constitutional:      General: She is  not in acute distress.    Appearance: Normal appearance. She is well-developed. She is not ill-appearing or toxic-appearing.  HENT:     Head: Normocephalic.     Right Ear: Hearing, tympanic membrane, ear canal and external ear normal. Tympanic membrane is not erythematous, retracted or bulging.     Left Ear: Hearing, tympanic membrane, ear canal and external ear normal. Tympanic membrane is not erythematous, retracted or bulging.     Nose: No mucosal edema or rhinorrhea.     Right Sinus: No maxillary sinus tenderness or frontal sinus tenderness.     Left Sinus: No maxillary sinus tenderness or frontal sinus tenderness.     Mouth/Throat:      Pharynx: Uvula midline.  Eyes:     General: Lids are normal. Lids are everted, no foreign bodies appreciated.     Conjunctiva/sclera: Conjunctivae normal.     Pupils: Pupils are equal, round, and reactive to light.  Neck:     Thyroid: No thyroid mass or thyromegaly.     Vascular: No carotid bruit.     Trachea: Trachea normal.  Cardiovascular:     Rate and Rhythm: Normal rate and regular rhythm.     Pulses: Normal pulses.     Heart sounds: Normal heart sounds, S1 normal and S2 normal. No murmur heard. No friction rub. No gallop.   Pulmonary:     Effort: Pulmonary effort is normal. No tachypnea or respiratory distress.     Breath sounds: Normal breath sounds. No decreased breath sounds, wheezing, rhonchi or rales.  Abdominal:     General: Bowel sounds are normal.     Palpations: Abdomen is soft.     Tenderness: There is no abdominal tenderness.  Musculoskeletal:     Right shoulder: Normal. No tenderness, bony tenderness or crepitus. Normal range of motion.     Right upper arm: Tenderness present. No swelling, edema, deformity, lacerations or bony tenderness.     Left upper arm: Normal.     Right elbow: Normal.       Arms:     Cervical back: Normal range of motion and neck supple.     Comments: Area of ttp marked in red  Skin:    General: Skin is warm and dry.     Findings: No rash.  Neurological:     Mental Status: She is alert.  Psychiatric:        Mood and Affect: Mood is not anxious or depressed.        Speech: Speech normal.        Behavior: Behavior normal. Behavior is cooperative.        Thought Content: Thought content normal.        Judgment: Judgment normal.       Results for orders placed or performed in visit on 12/08/20  POCT glycosylated hemoglobin (Hb A1C)  Result Value Ref Range   Hemoglobin A1C 8.3 (A) 4.0 - 5.6 %   HbA1c POC (<> result, manual entry)     HbA1c, POC (prediabetic range)     HbA1c, POC (controlled diabetic range)      This visit  occurred during the SARS-CoV-2 public health emergency.  Safety protocols were in place, including screening questions prior to the visit, additional usage of staff PPE, and extensive cleaning of exam room while observing appropriate contact time as indicated for disinfecting solutions.   COVID 19 screen:  No recent travel or known exposure to COVID19 The patient denies respiratory symptoms of COVID  19 at this time. The importance of social distancing was discussed today.   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 no involvement. Treated with oral chemo.. completed in early 09/2020      GERD (gastroesophageal reflux disease)     Controlled with daily prilosec.      History of breast cancer    S/P  Double mastectomy... no radiation or chemo was needed. Followed by Alen Blew originally.. then changed to Oncologist at [facility]      Hyperlipidemia associated with type 2 diabetes mellitus ([place])     Due for re-eval in 3 moths. Not on statin.       Lupus ([place])     Not currently  Treated.. has joint pain.  Consider rheum referral in future.      Right arm pain    Per pt on ONC eval.. noted to have tendonitis in left arm. Ongoing x 2 years !  ON review bone can showed: Mild calcific tendonitis right supraspinatus tendon.  This is not celarly in area pt has pain.. her is closer to bursa and mid triceps.   Will treat with home PT and topical diclofenac.. if not improving.. consider referral to orhto or Sports med      Type 2 diabetes mellitus ([place]) - Primary    Inadequate control per A1C of 8.3 today.  Get back on track with lifestyle cahnges.  Still occ having lows on Lantus 10 units daily and metformin 1000 mg BI. ? If metformin causing decreased appetite.  re-eval in 3 months.      Relevant Orders   POCT glycosylated hemoglobin (Hb A1C) (Completed)        Eliezer Lofts, MD

## 2020-12-08 NOTE — Assessment & Plan Note (Signed)
Inadequate control per A1C of 8.3 today.  Get back on track with lifestyle cahnges.  Still occ having lows on Lantus 10 units daily and metformin 1000 mg BI. ? If metformin causing decreased appetite.  re-eval in 3 months.

## 2020-12-08 NOTE — Assessment & Plan Note (Signed)
Controlled with daily prilosec.

## 2020-12-08 NOTE — Assessment & Plan Note (Signed)
Per pt on ONC eval.. noted to have tendonitis in left arm. Ongoing x 2 years !  ON review bone can showed: Mild calcific tendonitis right supraspinatus tendon.  This is not celarly in area pt has pain.. her is closer to bursa and mid triceps.   Will treat with home PT and topical diclofenac.. if not improving.. consider referral to orhto or Sports med

## 2020-12-08 NOTE — Patient Instructions (Addendum)
Work on low [employer], and exercise 3-5 days a week.  No skipping meals.  Apply Voltaren cream  Four times daily  To right upper arm pain for tendonitis vs bursitis.  Start home physical therapy.

## 2020-12-08 NOTE — Assessment & Plan Note (Addendum)
Not currently  Treated.. has joint pain.  Consider rheum referral in future.

## 2020-12-08 NOTE — Assessment & Plan Note (Signed)
Due for re-eval in 3 moths. Not on statin.

## 2020-12-12 ENCOUNTER — Encounter: Payer: Self-pay | Admitting: Primary Care

## 2021-01-02 IMAGING — US US BREAST*R* LIMITED INC AXILLA
1 series · 11 of 11 positions shown · non-contrast
Comparison: Previous exam(s).

CLINICAL DATA: Bilateral breast swelling and pain and tenderness
for past 6 months. Status post bilateral mastectomies with implant
reconstruction in 5050. She also reports a small rounded superficial
mass in the right axilla that was present prior to surgery.

EXAM:
ULTRASOUND OF THE BILATERAL BREAST

[Series 1: us breast*right* limited inc axilla · 0.07mm/px · 11 of 11 slices shown]
[im 1/11]
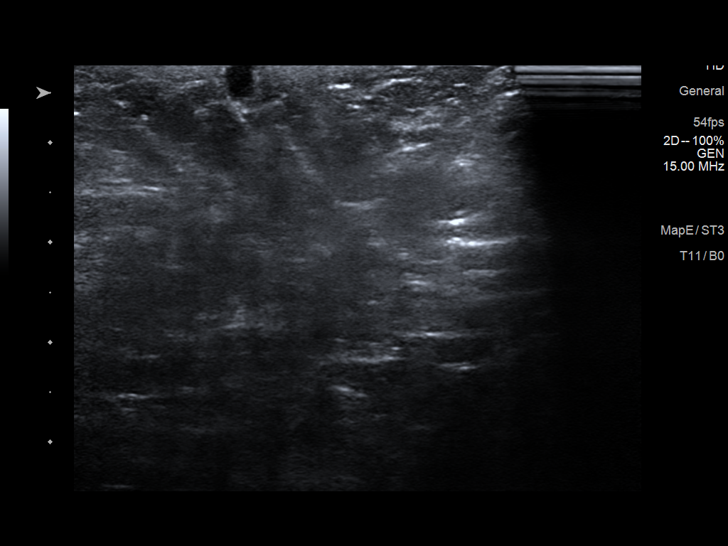
[im 2/11]
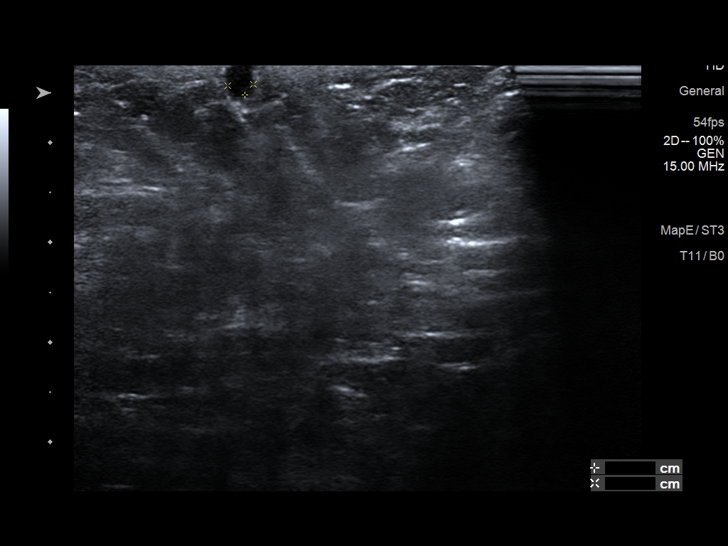
[im 3/11]
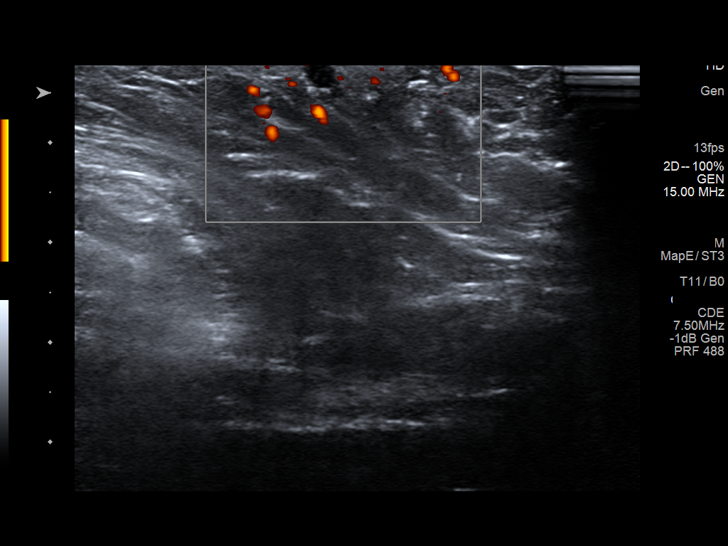
[im 4/11]
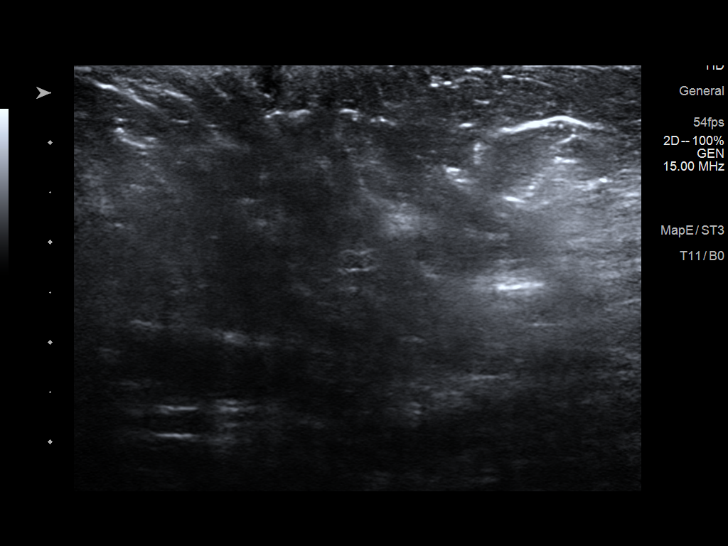
[im 5/11]
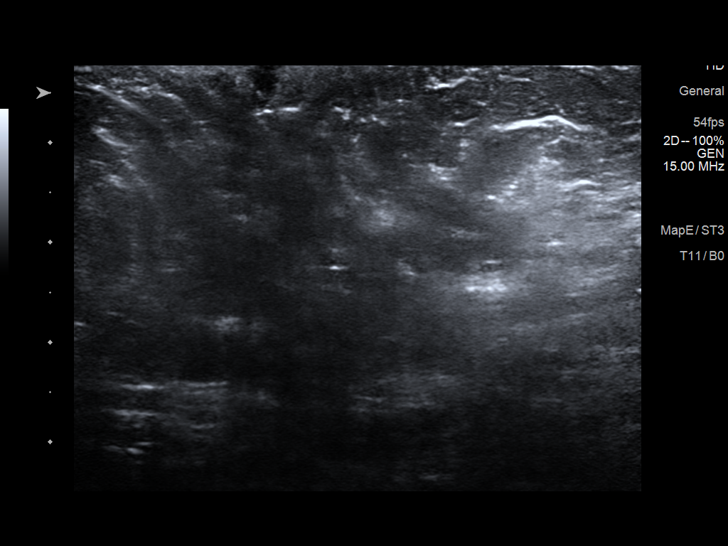
[im 6/11]
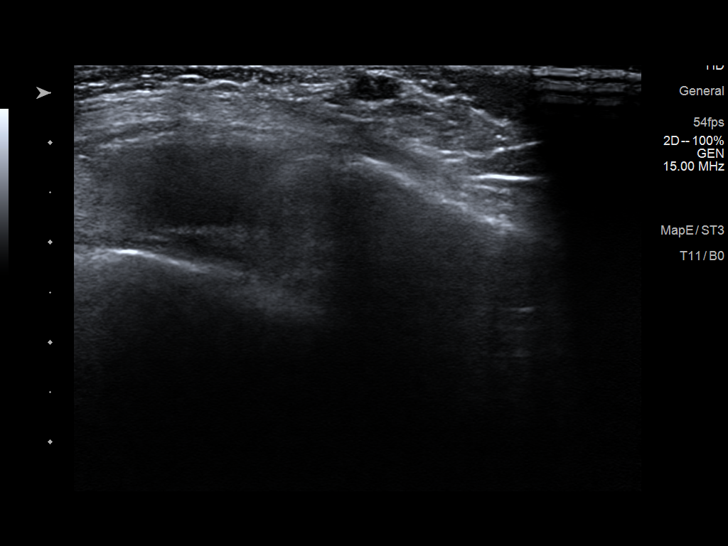
[im 7/11]
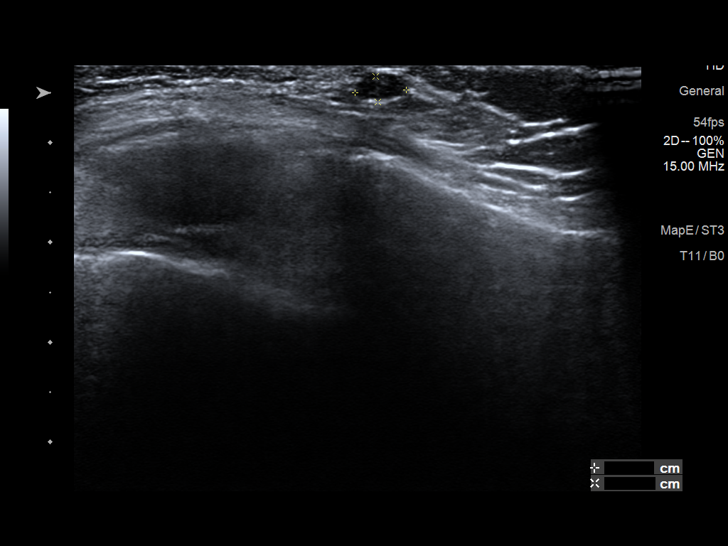
[im 8/11]
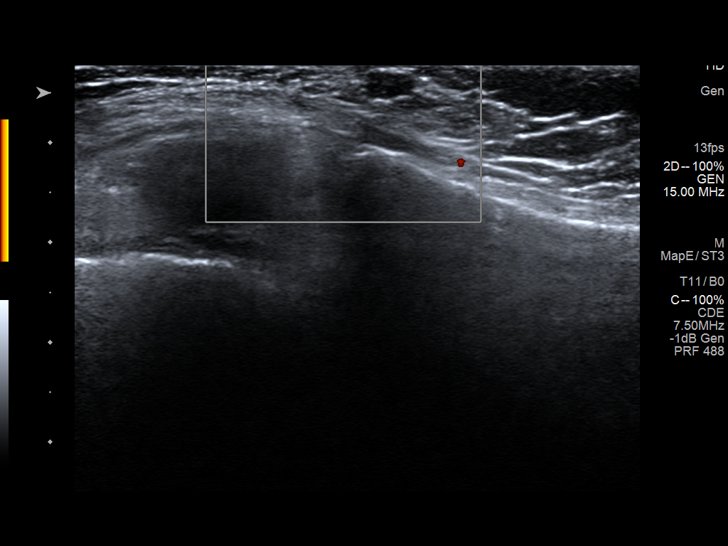
[im 9/11]
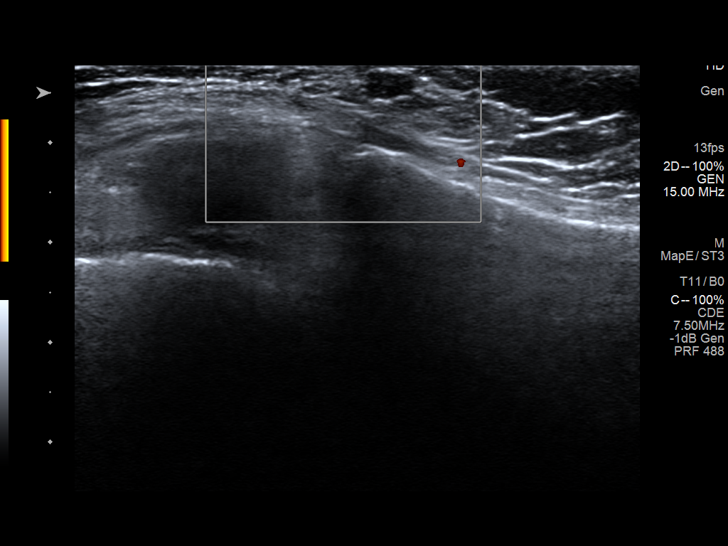
[im 10/11]
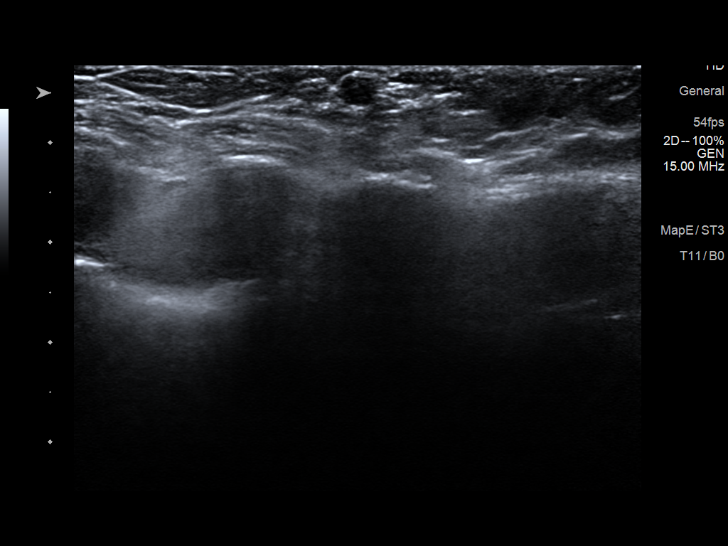
[im 11/11]
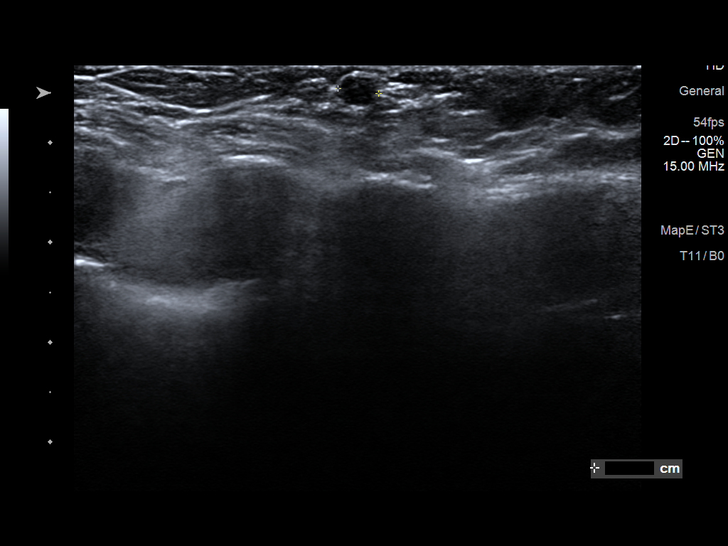

[11 of 11 positions shown; findings below may reference images not displayed]

FINDINGS: On physical exam, the patient has an approximately 3 mm rounded,
circumscribed, superficial mass in the right axilla. There are no
palpable abnormalities in the areas patient concern in both breasts.

Targeted ultrasound is performed, showing a 3 x 3 x 3 mm cyst with
low-level internal echoes in the posterior dermal layer and
extending into the subcutaneous fat in the right axilla,
corresponding to the amount full mass. There is a small skin tract.
No internal blood flow was seen with power Doppler. This corresponds
to the palpable mass.

In the parasternal region on the right, there is a 5 x 4 x 3 mm
oval, horizontally oriented, circumscribed cyst in the subcutaneous
fat with low-level internal echoes and no internal blood flow with
power Doppler.

No abnormalities are demonstrated on the left in the areas of
patient concern.
IMPRESSION: 1. No evidence of malignancy.
2. 3 mm sebaceous/epidermal inclusion cyst in the right axilla.
3. 5 mm oil cyst in the parasternal region the right.

RECOMMENDATION:
Clinical follow-up.

I have discussed the findings and recommendations with the patient.
If applicable, a reminder letter will be sent to the patient
regarding the next appointment.

BI-RADS CATEGORY  2: Benign.

## 2021-01-04 ENCOUNTER — Telehealth: Payer: Self-pay | Admitting: *Deleted

## 2021-01-04 ENCOUNTER — Other Ambulatory Visit: Payer: Self-pay | Admitting: Primary Care

## 2021-01-04 DIAGNOSIS — E119 Type 2 diabetes mellitus without complications: Secondary | ICD-10-CM

## 2021-01-04 NOTE — Telephone Encounter (Signed)
Spoke with patient rescheduled CPX with Dr. Diona Browner

## 2021-01-04 NOTE — Telephone Encounter (Addendum)
Received PA request from CoverMyMeds stating claim for Lantus Solostar Pen Injectors has been rejected and require PA. USE INTERCHANGEABLE SEMGLEEPRODUCT OR SERVICE NOT COVERED.    I started PA but had questions so I called patient to get her assistance in answering some questions.  Patient states last time the pharmacist was able to work something out for her where she didn't have to pay anything for her Lantus.  She will check with pharmacy to see if they can do that again for her and call me back if I need to proceed with PA.

## 2021-01-04 NOTE — Telephone Encounter (Signed)
To PCP? Are you now her PCP? She has CPE scheduled with me for July, this was probably scheduled last year.

## 2021-01-07 DIAGNOSIS — E119 Type 2 diabetes mellitus without complications: Secondary | ICD-10-CM

## 2021-01-08 MED ORDER — INSULIN GLARGINE-YFGN 100 UNIT/ML ~~LOC~~ SOPN: 10.0000 [IU] | PEN_INJECTOR | Freq: Every day | SUBCUTANEOUS | 0 refills | Status: DC

## 2021-01-08 NOTE — Telephone Encounter (Signed)
Insurance prefers [employer].  Has used Basaglar in the past.

## 2021-01-09 ENCOUNTER — Telehealth: Payer: Self-pay | Admitting: *Deleted

## 2021-01-09 NOTE — Telephone Encounter (Signed)
Received fax from [place] requesting PA for [facility].  PA completed on CoverMyMeds and sent to review.  Can take up to 72 hours for a decision.

## 2021-01-29 ENCOUNTER — Ambulatory Visit: Payer: Self-pay

## 2021-02-01 ENCOUNTER — Encounter: Payer: Self-pay | Admitting: Plastic Surgery

## 2021-02-16 ENCOUNTER — Other Ambulatory Visit: Payer: Self-pay

## 2021-02-20 ENCOUNTER — Other Ambulatory Visit: Payer: Self-pay

## 2021-02-20 ENCOUNTER — Encounter: Payer: Self-pay | Admitting: Surgical

## 2021-02-20 ENCOUNTER — Ambulatory Visit (INDEPENDENT_AMBULATORY_CARE_PROVIDER_SITE_OTHER): Payer: BC Managed Care – PPO | Admitting: Surgical

## 2021-02-20 DIAGNOSIS — N644 Mastodynia: Secondary | ICD-10-CM | POA: Diagnosis not present

## 2021-02-20 DIAGNOSIS — Z9889 Other specified postprocedural states: Secondary | ICD-10-CM

## 2021-02-20 NOTE — Progress Notes (Signed)
   Referring Provider Jinny Sanders, MD Miranda Greene,  Miranda Greene [id number]   CC: No chief complaint on file.     Miranda Greene is an 65 y.o. female.  HPI: Patient is a 65 year old female here for follow-up on her bilateral breast reconstruction.  She has implants in place.  She was last seen in the office on 07/17/2020 and noted some tenderness of her left axilla, right axilla and left chest wall.  Dr. Marla Roe as previously discussed with the patient that removal of the implants is an option for improvement in the pain.  She currently has [employer] high-profile extra silicone implant [id number] in place. Patient had ultrasound of left and right breast and axillas on 07/19/2020 which showed no evidence of malignancy, she did have a sebaceous cyst in the right axilla and oil cyst in the parasternal region of the right.  Patient reports that she is still having daily pain in her bilateral breasts.  She specifically reports feelings of knots in her bilateral axilla feels as if she has golf balls and tennis balls in these areas.  She also reports that she has a lot of swelling in the morning.  She has occasional sharp shooting pains.  She is also having headaches.  She is interested in removal of her breast implants due to the pain because it is affecting her daily life and she is unable to do the things she would like to do which include caring for her grandkids.  She does not feel as if she has limited physically as she is able to do a lot of physical activities.  Review of Systems General: No fevers or chills  Physical Exam Vitals with BMI 12/08/2020 07/17/2020 06/01/2020  Height 5\' 3"  5\' 3"  5\' 3"   Weight 138 lbs 4 oz 135 lbs 139 lbs  BMI 24.5 30.07 62.26  Systolic 333 545 625  Diastolic 62 90 78  Pulse 92 93 94    General:  No acute distress,  Alert and oriented, Non-Toxic, Normal speech and affect Bilateral breasts: Bilateral breast incisions intact.  No erythema noted.  No  swelling noted.  No subcutaneous fluid collections noted.  Bilateral breast implants are soft, no capsular contracture noted with palpation.  Assessment/Plan 65 year old female status post bilateral breast reconstruction with placement of bilateral breast implants on 09/08/2019.  She is having daily pain at this point with her bilateral breast implants.  She has had ultrasounds of bilateral breast and axilla which was essentially normal in regards to the implants.  On exam today there is no signs of concern but she is having daily pain and this is affecting her daily life and she is interested in removal of her implants.  We did discuss that physical therapy may be helpful, but she feels as if she is very active.  I would like her to schedule a telephone visit with Dr. Marla Roe in the next few weeks or next available to further discuss her options.  All of her questions were answered to her content.  Pictures were taken and placed in the patient's chart with patient permission.  There is no sign of infection, seroma, hematoma.   Miranda Greene 02/20/2021, 3:06 PM

## 2021-03-06 ENCOUNTER — Other Ambulatory Visit (INDEPENDENT_AMBULATORY_CARE_PROVIDER_SITE_OTHER): Payer: BC Managed Care – PPO

## 2021-03-06 ENCOUNTER — Other Ambulatory Visit: Payer: Self-pay

## 2021-03-06 ENCOUNTER — Telehealth: Payer: Self-pay | Admitting: Family Medicine

## 2021-03-06 DIAGNOSIS — E119 Type 2 diabetes mellitus without complications: Secondary | ICD-10-CM | POA: Diagnosis not present

## 2021-03-06 LAB — LIPID PANEL
Cholesterol: 183 mg/dL (ref 0–200)
HDL: 36.1 mg/dL — ABNORMAL LOW (ref >39.00)
LDL Cholesterol: 118 mg/dL — ABNORMAL HIGH (ref 0–99)
NonHDL: 147.25
Total CHOL/HDL Ratio: 5
Triglycerides: 148 mg/dL (ref 0.0–149.0)
VLDL: 29.6 mg/dL (ref 0.0–40.0)

## 2021-03-06 LAB — COMPREHENSIVE METABOLIC PANEL WITH GFR
ALT: 12 U/L (ref 0–35)
AST: 14 U/L (ref 0–37)
Albumin: 4.2 g/dL (ref 3.5–5.2)
Alkaline Phosphatase: 60 U/L (ref 39–117)
BUN: 13 mg/dL (ref 6–23)
CO2: 28 meq/L (ref 19–32)
Calcium: 9.6 mg/dL (ref 8.4–10.5)
Chloride: 104 meq/L (ref 96–112)
Creatinine, Ser: 0.89 mg/dL (ref 0.40–1.20)
GFR: 69.09 mL/min (ref >60.00)
Glucose, Bld: 91 mg/dL (ref 70–99)
Potassium: 4.5 meq/L (ref 3.5–5.1)
Sodium: 139 meq/L (ref 135–145)
Total Bilirubin: 0.6 mg/dL (ref 0.2–1.2)
Total Protein: 6.6 g/dL (ref 6.0–8.3)

## 2021-03-06 LAB — HEMOGLOBIN A1C: Hgb A1c MFr Bld: 8.3 % — ABNORMAL HIGH (ref 4.6–6.5)

## 2021-03-06 NOTE — Telephone Encounter (Signed)
-----   Message from Cloyd Stagers, RT sent at 02/19/2021  1:27 PM EDT ----- Regarding: Lab Orders forTuesday 6.28.2022 Please place lab orders for Tuesday 6.28.2022, office visit for physical on Friday 7.1.2022 Thank you, Dyke Maes RT(R)

## 2021-03-06 NOTE — Progress Notes (Signed)
No critical labs need to be addressed urgently. We will discuss labs in detail at upcoming office visit.   

## 2021-03-09 ENCOUNTER — Other Ambulatory Visit: Payer: Self-pay

## 2021-03-09 ENCOUNTER — Encounter: Payer: BC Managed Care – PPO | Admitting: Primary Care

## 2021-03-09 ENCOUNTER — Encounter: Payer: Self-pay | Admitting: Family Medicine

## 2021-03-09 ENCOUNTER — Ambulatory Visit (INDEPENDENT_AMBULATORY_CARE_PROVIDER_SITE_OTHER): Payer: BC Managed Care – PPO | Admitting: Family Medicine

## 2021-03-09 VITALS — BP 130/84 | HR 80 | Temp 97.6°F | Ht 63.75 in | Wt 137.5 lb

## 2021-03-09 DIAGNOSIS — E1169 Type 2 diabetes mellitus with other specified complication: Secondary | ICD-10-CM

## 2021-03-09 DIAGNOSIS — E1165 Type 2 diabetes mellitus with hyperglycemia: Secondary | ICD-10-CM

## 2021-03-09 DIAGNOSIS — Z0000 Encounter for general adult medical examination without abnormal findings: Secondary | ICD-10-CM

## 2021-03-09 DIAGNOSIS — E119 Type 2 diabetes mellitus without complications: Secondary | ICD-10-CM

## 2021-03-09 DIAGNOSIS — Z789 Other specified health status: Secondary | ICD-10-CM

## 2021-03-09 DIAGNOSIS — E785 Hyperlipidemia, unspecified: Secondary | ICD-10-CM

## 2021-03-09 LAB — MICROALBUMIN / CREATININE URINE RATIO
Creatinine,U: 34.4 mg/dL
Microalb Creat Ratio: 2 mg/g (ref 0.0–30.0)
Microalb, Ur: <0.7 mg/dL (ref 0.0–1.9)

## 2021-03-09 NOTE — Progress Notes (Signed)
Patient ID: Miranda Greene, female    DOB: 08-25-1956, 65 y.o.   MRN: [id number]  This visit was conducted in person.  BP 130/84   Pulse 80   Temp 97.6 F (36.4 C) (Temporal)   Ht 5' 3.75" (1.619 m)   Wt 137 lb 8 oz (62.4 kg)   SpO2 98%   BMI 23.79 kg/m    CC: Chief Complaint  Patient presents with   Annual Exam    Subjective:   HPI: Miranda Greene is a 65 y.o. female presenting on 03/09/2021 for Annual Exam  Elevated Cholesterol:  Myalgia with simvastatin, crestor and pravastatin. Lab Results  Component Value Date   CHOL 183 03/06/2021   HDL 36.10 (L) 03/06/2021   LDLCALC 118 (H) 03/06/2021   TRIG 148.0 03/06/2021   CHOLHDL 5 03/06/2021  Using medications without problems: Muscle aches:  Diet compliance: moderate Exercise: daily walking 10-15 minutes. Other complaints:   Diabetes:  Poor control on max metformin.  10 Units long acting glargine at bedtime  She  is on low carb diet.Marland Kitchen does eat fresh fruit ( Lab Results  Component Value Date   HGBA1C 8.3 (H) 03/06/2021  Using medications without difficulties: Hypoglycemic episodes: none Hyperglycemic episodes:  one episode when she was sick. Feet problems: no ulcer Blood Sugars averaging: FBS 95-147, 2 hours after meal: not checking eye exam within last year: yes  BP Readings from Last 3 Encounters:  03/09/21 130/84  12/08/20 122/62  07/17/20 125/90        Relevant past medical, surgical, family and social history reviewed and updated as indicated. Interim medical history since our last visit reviewed. Allergies and medications reviewed and updated. Outpatient Medications Prior to Visit  Medication Sig Dispense Refill   ibuprofen (ADVIL,MOTRIN) 200 MG tablet Take 200 mg by mouth every 6 (six) hours as needed for moderate pain.      Insulin Glargine-yfgn (SEMGLEE, YFGN,) 100 UNIT/ML SOPN Inject 10 Units into the skin at bedtime. 15 mL 0   metFORMIN (GLUCOPHAGE) 1000 MG tablet TAKE 1 TABLET BY MOUTH  TWICE DAILY WITH A MEAL FOR DIABETES 180 tablet 2   omeprazole (PRILOSEC) 20 MG capsule Take 20 mg by mouth daily.     Clotrimazole (MYCELEX OTC EX) Apply topically.     No facility-administered medications prior to visit.     Per HPI unless specifically indicated in ROS section below Review of Systems  Constitutional:  Negative for fatigue and fever.  HENT:  Negative for congestion.   Eyes:  Negative for pain.  Respiratory:  Negative for cough and shortness of breath.   Cardiovascular:  Negative for chest pain, palpitations and leg swelling.  Gastrointestinal:  Negative for abdominal pain.  Genitourinary:  Negative for dysuria and vaginal bleeding.  Musculoskeletal:  Negative for back pain.  Neurological:  Negative for syncope, light-headedness and headaches.  Psychiatric/Behavioral:  Negative for dysphoric mood.   Objective:  BP 130/84   Pulse 80   Temp 97.6 F (36.4 C) (Temporal)   Ht 5' 3.75" (1.619 m)   Wt 137 lb 8 oz (62.4 kg)   SpO2 98%   BMI 23.79 kg/m   Wt Readings from Last 3 Encounters:  03/09/21 137 lb 8 oz (62.4 kg)  12/08/20 138 lb 4 oz (62.7 kg)  07/17/20 135 lb (61.2 kg)      Physical Exam Constitutional:      General: She is not in acute distress.    Appearance: Normal appearance. She  is well-developed. She is not ill-appearing or toxic-appearing.  HENT:     Head: Normocephalic.     Right Ear: Hearing, tympanic membrane, ear canal and external ear normal.     Left Ear: Hearing, tympanic membrane, ear canal and external ear normal.     Nose: Nose normal.  Eyes:     General: Lids are normal. Lids are everted, no foreign bodies appreciated.     Conjunctiva/sclera: Conjunctivae normal.     Pupils: Pupils are equal, round, and reactive to light.  Neck:     Thyroid: No thyroid mass or thyromegaly.     Vascular: No carotid bruit.     Trachea: Trachea normal.  Cardiovascular:     Rate and Rhythm: Normal rate and regular rhythm.     Heart sounds: Normal  heart sounds, S1 normal and S2 normal. No murmur heard.   No gallop.  Pulmonary:     Effort: Pulmonary effort is normal. No respiratory distress.     Breath sounds: Normal breath sounds. No wheezing, rhonchi or rales.  Abdominal:     General: Bowel sounds are normal. There is no distension or abdominal bruit.     Palpations: Abdomen is soft. There is no fluid wave or mass.     Tenderness: There is no abdominal tenderness. There is no guarding or rebound.     Hernia: No hernia is present.  Musculoskeletal:     Cervical back: Normal range of motion and neck supple.  Lymphadenopathy:     Cervical: No cervical adenopathy.  Skin:    General: Skin is warm and dry.     Findings: No rash.  Neurological:     Mental Status: She is alert.     Cranial Nerves: No cranial nerve deficit.     Sensory: No sensory deficit.  Psychiatric:        Mood and Affect: Mood is not anxious or depressed.        Speech: Speech normal.        Behavior: Behavior normal. Behavior is cooperative.        Judgment: Judgment normal.      Results for orders placed or performed in visit on 03/06/21  Comprehensive metabolic panel  Result Value Ref Range   Sodium 139 135 - 145 mEq/L   Potassium 4.5 3.5 - 5.1 mEq/L   Chloride 104 96 - 112 mEq/L   CO2 28 19 - 32 mEq/L   Glucose, Bld 91 70 - 99 mg/dL   BUN 13 6 - 23 mg/dL   Creatinine, Ser 0.89 0.40 - 1.20 mg/dL   Total Bilirubin 0.6 0.2 - 1.2 mg/dL   Alkaline Phosphatase 60 39 - 117 U/L   AST 14 0 - 37 U/L   ALT 12 0 - 35 U/L   Total Protein 6.6 6.0 - 8.3 g/dL   Albumin 4.2 3.5 - 5.2 g/dL   GFR 69.09 >60.00 mL/min   Calcium 9.6 8.4 - 10.5 mg/dL  Lipid panel  Result Value Ref Range   Cholesterol 183 0 - 200 mg/dL   Triglycerides 148.0 0.0 - 149.0 mg/dL   HDL 36.10 (L) >39.00 mg/dL   VLDL 29.6 0.0 - 40.0 mg/dL   LDL Cholesterol 118 (H) 0 - 99 mg/dL   Total CHOL/HDL Ratio 5    NonHDL 147.25   Hemoglobin A1c  Result Value Ref Range   Hgb A1c MFr Bld 8.3  (H) 4.6 - 6.5 %    This visit occurred during the SARS-CoV-2  public health emergency.  Safety protocols were in place, including screening questions prior to the visit, additional usage of staff PPE, and extensive cleaning of exam room while observing appropriate contact time as indicated for disinfecting solutions.   COVID 19 screen:  No recent travel or known exposure to COVID19 The patient denies respiratory symptoms of COVID 19 at this time. The importance of social distancing was discussed today.   Assessment and Plan The patient's preventative maintenance and recommended screening tests for an annual wellness exam were reviewed in full today. Brought up to date unless services declined.  Counselled on the importance of diet, exercise, and its role in overall health and mortality. The patient's FH and SH was reviewed, including their home life, tobacco status, and drug and alcohol status.    Vaccines: Due for shingrix, COVID vaccine x 2 ( considering booster) Uptodate with PNA and td. Pap/DVE:  partial hysterectomy, occ right lower abd pain Mammo:  07/2020 [place]... yearly checks with history of breast cancer, followed by Dr. Wray Kearns Density: due  Colon: 09/11/20, repeat 10 years Smoking Status:none ETOH/ drug EYC:XKGY/JEHU  Hep C:  negative  Problem List Items Addressed This Visit     DM (diabetes mellitus), type 2, uncontrolled ([place])    Poor control despite ideal weight, exercise and diet... 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  She will check 2 hours postprandials to determine if mealtime insulin indicated.  Already on max metformin and increasing glargine may drop AMs too low.       Hyperlipidemia associated with type 2 diabetes mellitus ([place])    Has been intolerant of several statins... she will consider pravastatin very low dose along with Co Q 10 in future.. right now she is overwhelmed with plans for removal of breast implants.  The 10-year ASCVD risk score Mikey Bussing DC Brooke Bonito., et  al., 2013) is: 10.3%   Values used to calculate the score:     Age: 33 years     Sex: Female     Is Non-Hispanic African American: No     Diabetic: Yes     Tobacco smoker: No     Systolic Blood Pressure: 314 mmHg     Is BP treated: No     HDL Cholesterol: 36.1 mg/dL     Total Cholesterol: 183 mg/dL        Statin intolerance   Other Visit Diagnoses     Routine general medical examination at a health care facility    -  Primary        Eliezer Lofts, MD

## 2021-03-09 NOTE — Assessment & Plan Note (Signed)
Has been intolerant of several statins... she will consider pravastatin very low dose along with Co Q 10 in future.. right now she is overwhelmed with plans for removal of breast implants.  The 10-year ASCVD risk score Mikey Bussing DC Brooke Bonito., et al., 2013) is: 10.3%   Values used to calculate the score:     Age: 65 years     Sex: Female     Is Non-Hispanic African American: No     Diabetic: Yes     Tobacco smoker: No     Systolic Blood Pressure: 098 mmHg     Is BP treated: No     HDL Cholesterol: 36.1 mg/dL     Total Cholesterol: 183 mg/dL

## 2021-03-09 NOTE — Patient Instructions (Addendum)
Check   blood sugar  2 hours after meals.. especially after dinner over the next 2 weeks.. call with measurements.  We can decide if a fasting acting insulin would appropriate.  Consider pravastatin low dose three days a week with CoQ10.  Call  when you are ready for bone denisty.

## 2021-03-09 NOTE — Assessment & Plan Note (Signed)
Poor control despite ideal weight, exercise and diet... 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  She will check 2 hours postprandials to determine if mealtime insulin indicated.  Already on max metformin and increasing glargine may drop AMs too low.

## 2021-03-14 ENCOUNTER — Encounter: Payer: Self-pay | Admitting: Family Medicine

## 2021-03-23 ENCOUNTER — Other Ambulatory Visit: Payer: Self-pay

## 2021-03-23 ENCOUNTER — Telehealth (INDEPENDENT_AMBULATORY_CARE_PROVIDER_SITE_OTHER): Payer: BC Managed Care – PPO | Admitting: Plastic Surgery

## 2021-03-23 DIAGNOSIS — M79601 Pain in right arm: Secondary | ICD-10-CM

## 2021-03-23 DIAGNOSIS — Z9889 Other specified postprocedural states: Secondary | ICD-10-CM | POA: Diagnosis not present

## 2021-03-23 NOTE — Progress Notes (Signed)
   Subjective:    Patient ID: Miranda Greene, female    DOB: 08-Jun-1956, 65 y.o.   MRN: [id number]  The patient is a 65 yrs old wf joining me by televisit.  She is at work and I am in the office.  She underwent bilateral breast reconstruction.  She has Mentor high profile extra silicone implants 967 cc. She had concerns about axillary tenderness.  Today the patient states that the pain and tenderness has persisted.  Nothing she does seems to make it better.  She wakes up with it and fusses with it during the day.  She describes them as golf balls under her arms.  She is still interested in having them removed and understands that they would not be replaced in the future.  We discussed the possibility of putting smaller ones and then she does not want to do that either.     Review of Systems     Objective:   Physical Exam     Assessment & Plan:     ICD-10-CM   1. S/P breast reconstruction, bilateral  Z98.890     2. Right arm pain  M79.601       I connected with  Miranda Greene on 03/23/21 by a video enabled telemedicine application and verified that I am speaking with the correct person using two identifiers.  We spent 5 minutes in discussion, review of chart and documentation.  Plan for bilateral removal of implants with complete capsulectomies.   I discussed the limitations of evaluation and management by telemedicine. The patient expressed understanding and agreed to proceed.

## 2021-03-29 ENCOUNTER — Other Ambulatory Visit: Payer: Self-pay

## 2021-03-29 ENCOUNTER — Encounter: Payer: Self-pay | Admitting: Plastic Surgery

## 2021-03-29 ENCOUNTER — Ambulatory Visit (INDEPENDENT_AMBULATORY_CARE_PROVIDER_SITE_OTHER): Payer: BC Managed Care – PPO | Admitting: Plastic Surgery

## 2021-03-29 VITALS — BP 150/73 | Resp 16 | Ht 63.0 in | Wt 137.0 lb

## 2021-03-29 DIAGNOSIS — Z9889 Other specified postprocedural states: Secondary | ICD-10-CM

## 2021-03-29 DIAGNOSIS — Z853 Personal history of malignant neoplasm of breast: Secondary | ICD-10-CM

## 2021-03-29 MED ORDER — HYDROCODONE-ACETAMINOPHEN 5-325 MG PO TABS: 1.0000 | ORAL_TABLET | Freq: Three times a day (TID) | ORAL | 0 refills | Status: DC | PRN

## 2021-03-29 MED ORDER — DIAZEPAM 2 MG PO TABS: 2.0000 mg | ORAL_TABLET | Freq: Four times a day (QID) | ORAL | 0 refills | Status: DC | PRN

## 2021-03-29 MED ORDER — CEPHALEXIN 500 MG PO CAPS: 500.0000 mg | ORAL_CAPSULE | Freq: Four times a day (QID) | ORAL | 0 refills | Status: AC

## 2021-03-29 MED ORDER — ONDANSETRON HCL 4 MG PO TABS: 4.0000 mg | ORAL_TABLET | Freq: Three times a day (TID) | ORAL | 0 refills | Status: AC | PRN

## 2021-03-29 NOTE — Progress Notes (Signed)
Patient ID: Miranda Greene, female    DOB: Jan 13, 1956, 65 y.o.   MRN: [id number]   Chief Complaint  Patient presents with   Pre-op Exam    The patient is a 65 year old female here for her preoperative exam for removal of bilateral breast implants.  She currently has in [employer]or smooth round high-profile Xtra gel 380 cc implants bilaterally.  These were placed September 08, 2019.  She complains of chest wall tenderness and rib tenderness.  We have tried exercising and stretching.  It has not improved and the patient does not want to continue.  We have had multiple discussions about not being able to be reconstructed in the future if the implants are removed.  The patient has come to terms with this and understands well.  She seems to be very comfortable in her skin and would like to move ahead with removal.  She is also interested in having any excessive skin removed with the understanding that there will be some wrinkling.  This is necessary to prevent stretch when she raises her arms up.  She is not a smoker.   Review of Systems  Constitutional:  Negative for activity change.  HENT: Negative.    Eyes: Negative.   Respiratory:  Negative for chest tightness.   Cardiovascular: Negative.  Negative for leg swelling.  Gastrointestinal: Negative.   Endocrine: Negative.   Genitourinary: Negative.   Skin: Negative.  Negative for color change and wound.  Neurological: Negative.   Hematological: Negative.   Psychiatric/Behavioral: Negative.     Past Medical History:  Diagnosis Date   Arthritis    Hands, feet   Breast cancer ([place]) 2020   Cancer ([place])    breast   Diabetes mellitus    GERD (gastroesophageal reflux disease)    Hyperlipidemia    Lupus ([place])     Past Surgical History:  Procedure Laterality Date   ABDOMINAL HYSTERECTOMY     partial hysterectmy   AUGMENTATION MAMMAPLASTY Left 09/08/2019   BLADDER SURGERY     BREAST EXCISIONAL BIOPSY Right    benign 2009   BREAST  RECONSTRUCTION WITH PLACEMENT OF TISSUE EXPANDER AND FLEX HD (ACELLULAR HYDRATED DERMIS) Bilateral 06/21/2019   Procedure: BREAST RECONSTRUCTION WITH PLACEMENT OF TISSUE EXPANDER AND FLEX HD (ACELLULAR HYDRATED DERMIS);  Surgeon: Wallace Going, DO;  Location: [facility];  Service: Plastics;  Laterality: Bilateral;   MASTECTOMY Bilateral 06/21/2019   MASTECTOMY W/ SENTINEL NODE BIOPSY Bilateral 06/21/2019   Procedure: BILATERAL MASTECTOMIES;  Surgeon: Coralie Keens, MD;  Location: [facility];  Service: General;  Laterality: Bilateral;   REMOVAL OF BILATERAL TISSUE EXPANDERS WITH PLACEMENT OF BILATERAL BREAST IMPLANTS Bilateral 09/08/2019   Procedure: REMOVAL OF BILATERAL TISSUE EXPANDERS WITH PLACEMENT OF BILATERAL BREAST IMPLANTS;  Surgeon: Wallace Going, DO;  Location: [facility];  Service: Plastics;  Laterality: Bilateral;   SENTINEL NODE BIOPSY Left 06/21/2019   Procedure: Left Sentinel Lymph Node Biopsy;  Surgeon: Coralie Keens, MD;  Location: [facility];  Service: General;  Laterality: Left;      Current Outpatient Medications:    ibuprofen (ADVIL,MOTRIN) 200 MG tablet, Take 200 mg by mouth every 6 (six) hours as needed for moderate pain. , Disp: , Rfl:    Insulin Glargine-yfgn (SEMGLEE, YFGN,) 100 UNIT/ML SOPN, Inject 10 Units into the skin at bedtime., Disp: 15 mL, Rfl: 0   metFORMIN (GLUCOPHAGE) 1000 MG tablet, TAKE 1 TABLET BY MOUTH TWICE DAILY WITH A MEAL FOR DIABETES, Disp: 180 tablet,  Rfl: 2   omeprazole (PRILOSEC) 20 MG capsule, Take 20 mg by mouth daily., Disp: , Rfl:    Objective:   Vitals:   03/29/21 1338  BP: (!) 150/73  Resp: 16    Physical Exam Vitals and nursing note reviewed.  Constitutional:      Appearance: Normal appearance.  HENT:     Head: Normocephalic and atraumatic.  Cardiovascular:     Rate and Rhythm: Normal rate.     Pulses: Normal pulses.  Pulmonary:     Effort: Pulmonary effort is normal. No respiratory distress.     Breath  sounds: No wheezing.  Chest:     Chest wall: Tenderness present.  Abdominal:     General: Abdomen is flat. There is no distension.     Tenderness: There is no abdominal tenderness.  Musculoskeletal:        General: No deformity.  Skin:    General: Skin is warm.     Capillary Refill: Capillary refill takes less than 2 seconds.     Coloration: Skin is not jaundiced or pale.     Findings: No bruising.  Neurological:     General: No focal deficit present.     Mental Status: She is alert and oriented to person, place, and time.  Psychiatric:        Mood and Affect: Mood normal.        Behavior: Behavior normal.        Thought Content: Thought content normal.    Assessment & Plan:  S/P breast reconstruction, bilateral  History of breast cancer  The risks that can be encountered with and after removal of breast implant were discussed and include the following but not limited to these: bleeding, infection, delayed healing, anesthesia risks, skin sensation changes, injury to structures including nerves, blood vessels, and muscles which may be temporary or permanent, allergies to tape, suture materials and glues, blood products, topical preparations or injected agents, skin contour irregularities, skin discoloration and swelling, deep vein thrombosis, cardiac and pulmonary complications, pain, which may persist, fluid accumulation, wrinkling of the skin, persistent pain, formation of tight scar tissue (capsular contracture), possible need for revisional surgery or staged procedures.   The plan is for removal of bilateral breast implants.  Patient was also given her postop instructions and her postop medications and follow-up visits.  Patient was also given a prescription for second to nature.  The patient's Caprini score is 3 and we agree that she will be up and moving very quickly within a few hours after the surgery therefore will not do medication prophylaxis but ambulation.    [place], DO

## 2021-03-29 NOTE — H&P (View-Only) (Signed)
Patient ID: Miranda Greene, female    DOB: 07/30/1956, 65 y.o.   MRN: [id number]   Chief Complaint  Patient presents with   Pre-op Exam    The patient is a 65 year old female here for her preoperative exam for removal of bilateral breast implants.  She currently has in [employer]or smooth round high-profile Xtra gel 380 cc implants bilaterally.  These were placed September 08, 2019.  She complains of chest wall tenderness and rib tenderness.  We have tried exercising and stretching.  It has not improved and the patient does not want to continue.  We have had multiple discussions about not being able to be reconstructed in the future if the implants are removed.  The patient has come to terms with this and understands well.  She seems to be very comfortable in her skin and would like to move ahead with removal.  She is also interested in having any excessive skin removed with the understanding that there will be some wrinkling.  This is necessary to prevent stretch when she raises her arms up.  She is not a smoker.   Review of Systems  Constitutional:  Negative for activity change.  HENT: Negative.    Eyes: Negative.   Respiratory:  Negative for chest tightness.   Cardiovascular: Negative.  Negative for leg swelling.  Gastrointestinal: Negative.   Endocrine: Negative.   Genitourinary: Negative.   Skin: Negative.  Negative for color change and wound.  Neurological: Negative.   Hematological: Negative.   Psychiatric/Behavioral: Negative.     Past Medical History:  Diagnosis Date   Arthritis    Hands, feet   Breast cancer ([place]) 2020   Cancer ([place])    breast   Diabetes mellitus    GERD (gastroesophageal reflux disease)    Hyperlipidemia    Lupus ([place])     Past Surgical History:  Procedure Laterality Date   ABDOMINAL HYSTERECTOMY     partial hysterectmy   AUGMENTATION MAMMAPLASTY Left 09/08/2019   BLADDER SURGERY     BREAST EXCISIONAL BIOPSY Right    benign 2009   BREAST  RECONSTRUCTION WITH PLACEMENT OF TISSUE EXPANDER AND FLEX HD (ACELLULAR HYDRATED DERMIS) Bilateral 06/21/2019   Procedure: BREAST RECONSTRUCTION WITH PLACEMENT OF TISSUE EXPANDER AND FLEX HD (ACELLULAR HYDRATED DERMIS);  Surgeon: Wallace Going, DO;  Location: [facility];  Service: Plastics;  Laterality: Bilateral;   MASTECTOMY Bilateral 06/21/2019   MASTECTOMY W/ SENTINEL NODE BIOPSY Bilateral 06/21/2019   Procedure: BILATERAL MASTECTOMIES;  Surgeon: Coralie Keens, MD;  Location: [facility];  Service: General;  Laterality: Bilateral;   REMOVAL OF BILATERAL TISSUE EXPANDERS WITH PLACEMENT OF BILATERAL BREAST IMPLANTS Bilateral 09/08/2019   Procedure: REMOVAL OF BILATERAL TISSUE EXPANDERS WITH PLACEMENT OF BILATERAL BREAST IMPLANTS;  Surgeon: Wallace Going, DO;  Location: [facility];  Service: Plastics;  Laterality: Bilateral;   SENTINEL NODE BIOPSY Left 06/21/2019   Procedure: Left Sentinel Lymph Node Biopsy;  Surgeon: Coralie Keens, MD;  Location: [facility];  Service: General;  Laterality: Left;      Current Outpatient Medications:    ibuprofen (ADVIL,MOTRIN) 200 MG tablet, Take 200 mg by mouth every 6 (six) hours as needed for moderate pain. , Disp: , Rfl:    Insulin Glargine-yfgn (SEMGLEE, YFGN,) 100 UNIT/ML SOPN, Inject 10 Units into the skin at bedtime., Disp: 15 mL, Rfl: 0   metFORMIN (GLUCOPHAGE) 1000 MG tablet, TAKE 1 TABLET BY MOUTH TWICE DAILY WITH A MEAL FOR DIABETES, Disp: 180 tablet,  Rfl: 2   omeprazole (PRILOSEC) 20 MG capsule, Take 20 mg by mouth daily., Disp: , Rfl:    Objective:   Vitals:   03/29/21 1338  BP: (!) 150/73  Resp: 16    Physical Exam Vitals and nursing note reviewed.  Constitutional:      Appearance: Normal appearance.  HENT:     Head: Normocephalic and atraumatic.  Cardiovascular:     Rate and Rhythm: Normal rate.     Pulses: Normal pulses.  Pulmonary:     Effort: Pulmonary effort is normal. No respiratory distress.     Breath  sounds: No wheezing.  Chest:     Chest wall: Tenderness present.  Abdominal:     General: Abdomen is flat. There is no distension.     Tenderness: There is no abdominal tenderness.  Musculoskeletal:        General: No deformity.  Skin:    General: Skin is warm.     Capillary Refill: Capillary refill takes less than 2 seconds.     Coloration: Skin is not jaundiced or pale.     Findings: No bruising.  Neurological:     General: No focal deficit present.     Mental Status: She is alert and oriented to person, place, and time.  Psychiatric:        Mood and Affect: Mood normal.        Behavior: Behavior normal.        Thought Content: Thought content normal.    Assessment & Plan:  S/P breast reconstruction, bilateral  History of breast cancer  The risks that can be encountered with and after removal of breast implant were discussed and include the following but not limited to these: bleeding, infection, delayed healing, anesthesia risks, skin sensation changes, injury to structures including nerves, blood vessels, and muscles which may be temporary or permanent, allergies to tape, suture materials and glues, blood products, topical preparations or injected agents, skin contour irregularities, skin discoloration and swelling, deep vein thrombosis, cardiac and pulmonary complications, pain, which may persist, fluid accumulation, wrinkling of the skin, persistent pain, formation of tight scar tissue (capsular contracture), possible need for revisional surgery or staged procedures.   The plan is for removal of bilateral breast implants.  Patient was also given her postop instructions and her postop medications and follow-up visits.  Patient was also given a prescription for second to nature.  The patient's Caprini score is 3 and we agree that she will be up and moving very quickly within a few hours after the surgery therefore will not do medication prophylaxis but ambulation.    [place], DO

## 2021-04-02 ENCOUNTER — Telehealth: Payer: Self-pay | Admitting: *Deleted

## 2021-04-02 NOTE — Telephone Encounter (Signed)
Received Prior Authorization from [facility] for Hydrocodone-Acetaminophen for the patient.    Called [facility] and spoke with Jazz regarding PA for the Hydrocodone-Acet.  She stated that patient's insurance will only pay for a 7 day supply of the Hydrocodone.    Informed Jazz at the pharmacy per Dr. Marla Roe to only fill 7 days supply of the Hydrocodone.//AB/CMA

## 2021-04-10 ENCOUNTER — Encounter (HOSPITAL_BASED_OUTPATIENT_CLINIC_OR_DEPARTMENT_OTHER): Payer: Self-pay | Admitting: Plastic Surgery

## 2021-04-10 ENCOUNTER — Other Ambulatory Visit: Payer: Self-pay

## 2021-04-12 ENCOUNTER — Other Ambulatory Visit: Payer: Self-pay | Admitting: Family Medicine

## 2021-04-12 DIAGNOSIS — E119 Type 2 diabetes mellitus without complications: Secondary | ICD-10-CM

## 2021-04-13 ENCOUNTER — Telehealth: Payer: BC Managed Care – PPO | Admitting: Plastic Surgery

## 2021-04-16 ENCOUNTER — Encounter (HOSPITAL_BASED_OUTPATIENT_CLINIC_OR_DEPARTMENT_OTHER)
Admission: RE | Admit: 2021-04-16 | Discharge: 2021-04-16 | Disposition: A | Discharge status: Home / Self Care | Payer: BC Managed Care – PPO | Source: Ambulatory Visit | Attending: Plastic Surgery | Admitting: Plastic Surgery

## 2021-04-16 DIAGNOSIS — Z79899 Other long term (current) drug therapy: Secondary | ICD-10-CM | POA: Diagnosis not present

## 2021-04-16 DIAGNOSIS — Z01812 Encounter for preprocedural laboratory examination: Secondary | ICD-10-CM | POA: Insufficient documentation

## 2021-04-16 DIAGNOSIS — Z853 Personal history of malignant neoplasm of breast: Secondary | ICD-10-CM | POA: Diagnosis not present

## 2021-04-16 DIAGNOSIS — Z9889 Other specified postprocedural states: Secondary | ICD-10-CM | POA: Diagnosis not present

## 2021-04-16 DIAGNOSIS — Z7984 Long term (current) use of oral hypoglycemic drugs: Secondary | ICD-10-CM | POA: Diagnosis not present

## 2021-04-16 DIAGNOSIS — Z45811 Encounter for adjustment or removal of right breast implant: Secondary | ICD-10-CM | POA: Diagnosis not present

## 2021-04-16 DIAGNOSIS — Z45812 Encounter for adjustment or removal of left breast implant: Secondary | ICD-10-CM | POA: Diagnosis not present

## 2021-04-16 DIAGNOSIS — Z9013 Acquired absence of bilateral breasts and nipples: Secondary | ICD-10-CM | POA: Diagnosis not present

## 2021-04-16 DIAGNOSIS — Z794 Long term (current) use of insulin: Secondary | ICD-10-CM | POA: Diagnosis not present

## 2021-04-16 LAB — BASIC METABOLIC PANEL
Anion gap: 7 (ref 5–15)
BUN: 13 mg/dL (ref 8–23)
CO2: 25 mmol/L (ref 22–32)
Calcium: 9.2 mg/dL (ref 8.9–10.3)
Chloride: 106 mmol/L (ref 98–111)
Creatinine, Ser: 0.82 mg/dL (ref 0.44–1.00)
GFR, Estimated: >60 mL/min (ref >60)
Glucose, Bld: 116 mg/dL — ABNORMAL HIGH (ref 70–99)
Potassium: 5 mmol/L (ref 3.5–5.1)
Sodium: 138 mmol/L (ref 135–145)

## 2021-04-16 NOTE — Progress Notes (Signed)
Surgical soap given with instructions, pt verbalized understanding.  

## 2021-04-18 ENCOUNTER — Encounter (HOSPITAL_BASED_OUTPATIENT_CLINIC_OR_DEPARTMENT_OTHER): Payer: Self-pay | Admitting: Plastic Surgery

## 2021-04-18 ENCOUNTER — Other Ambulatory Visit: Payer: Self-pay

## 2021-04-18 ENCOUNTER — Encounter (HOSPITAL_BASED_OUTPATIENT_CLINIC_OR_DEPARTMENT_OTHER)
Admission: RE | Disposition: A | Discharge status: Home / Self Care | Payer: Self-pay | Source: Home / Self Care | Attending: Plastic Surgery

## 2021-04-18 ENCOUNTER — Ambulatory Visit (HOSPITAL_BASED_OUTPATIENT_CLINIC_OR_DEPARTMENT_OTHER): Payer: BC Managed Care – PPO | Admitting: Anesthesiology

## 2021-04-18 ENCOUNTER — Ambulatory Visit (HOSPITAL_BASED_OUTPATIENT_CLINIC_OR_DEPARTMENT_OTHER)
Admission: RE | Admit: 2021-04-18 | Discharge: 2021-04-18 | Disposition: A | Discharge status: Home / Self Care | Payer: BC Managed Care – PPO | Attending: Plastic Surgery | Admitting: Plastic Surgery

## 2021-04-18 DIAGNOSIS — Z45812 Encounter for adjustment or removal of left breast implant: Secondary | ICD-10-CM | POA: Diagnosis not present

## 2021-04-18 DIAGNOSIS — Z853 Personal history of malignant neoplasm of breast: Secondary | ICD-10-CM | POA: Diagnosis not present

## 2021-04-18 DIAGNOSIS — Z9013 Acquired absence of bilateral breasts and nipples: Secondary | ICD-10-CM | POA: Insufficient documentation

## 2021-04-18 DIAGNOSIS — Z794 Long term (current) use of insulin: Secondary | ICD-10-CM | POA: Insufficient documentation

## 2021-04-18 DIAGNOSIS — Z7984 Long term (current) use of oral hypoglycemic drugs: Secondary | ICD-10-CM | POA: Insufficient documentation

## 2021-04-18 DIAGNOSIS — Z79899 Other long term (current) drug therapy: Secondary | ICD-10-CM | POA: Insufficient documentation

## 2021-04-18 DIAGNOSIS — E854 Organ-limited amyloidosis: Secondary | ICD-10-CM | POA: Diagnosis not present

## 2021-04-18 DIAGNOSIS — T8549XA Other mechanical complication of breast prosthesis and implant, initial encounter: Secondary | ICD-10-CM | POA: Diagnosis not present

## 2021-04-18 DIAGNOSIS — Z45811 Encounter for adjustment or removal of right breast implant: Secondary | ICD-10-CM | POA: Insufficient documentation

## 2021-04-18 DIAGNOSIS — K219 Gastro-esophageal reflux disease without esophagitis: Secondary | ICD-10-CM | POA: Diagnosis not present

## 2021-04-18 DIAGNOSIS — Z9889 Other specified postprocedural states: Secondary | ICD-10-CM | POA: Diagnosis not present

## 2021-04-18 DIAGNOSIS — T8544XA Capsular contracture of breast implant, initial encounter: Secondary | ICD-10-CM | POA: Diagnosis not present

## 2021-04-18 HISTORY — PX: BREAST IMPLANT REMOVAL: SHX5361

## 2021-04-18 HISTORY — PX: CAPSULECTOMY: SHX5381

## 2021-04-18 LAB — GLUCOSE, CAPILLARY
Glucose-Capillary: 162 mg/dL — ABNORMAL HIGH (ref 70–99)
Glucose-Capillary: 179 mg/dL — ABNORMAL HIGH (ref 70–99)

## 2021-04-18 SURGERY — REMOVAL, IMPLANT, BREAST
Anesthesia: General | Site: Breast | Laterality: Bilateral

## 2021-04-18 MED ORDER — PHENYLEPHRINE 40 MCG/ML (10ML) SYRINGE FOR IV PUSH (FOR BLOOD PRESSURE SUPPORT)
PREFILLED_SYRINGE | INTRAVENOUS | Status: AC
  Filled 2021-04-18: qty 10

## 2021-04-18 MED ORDER — LIDOCAINE HCL (PF) 2 % IJ SOLN
INTRAMUSCULAR | Status: AC
  Filled 2021-04-18: qty 5

## 2021-04-18 MED ORDER — DEXAMETHASONE SODIUM PHOSPHATE 4 MG/ML IJ SOLN
INTRAMUSCULAR | Status: DC | PRN
  Administered 2021-04-18: 5 mg via INTRAVENOUS

## 2021-04-18 MED ORDER — FENTANYL CITRATE (PF) 100 MCG/2ML IJ SOLN: 25.0000 ug | INTRAMUSCULAR | Status: DC | PRN

## 2021-04-18 MED ORDER — PROPOFOL 10 MG/ML IV BOLUS
INTRAVENOUS | Status: DC | PRN
  Administered 2021-04-18: 120 mg via INTRAVENOUS

## 2021-04-18 MED ORDER — HYDROMORPHONE HCL 1 MG/ML IJ SOLN
INTRAMUSCULAR | Status: AC
  Filled 2021-04-18: qty 0.5

## 2021-04-18 MED ORDER — ONDANSETRON HCL 4 MG/2ML IJ SOLN
4.0000 mg | Freq: Once | INTRAMUSCULAR | Status: AC | PRN
  Administered 2021-04-18: 4 mg via INTRAVENOUS

## 2021-04-18 MED ORDER — HYDROMORPHONE HCL 1 MG/ML IJ SOLN
0.2500 mg | INTRAMUSCULAR | Status: DC | PRN
  Administered 2021-04-18: 0.5 mg via INTRAVENOUS

## 2021-04-18 MED ORDER — SODIUM CHLORIDE 0.9 % IV SOLN: 250.0000 mL | INTRAVENOUS | Status: DC | PRN

## 2021-04-18 MED ORDER — ATROPINE SULFATE 0.4 MG/ML IJ SOLN
INTRAMUSCULAR | Status: AC
  Filled 2021-04-18: qty 1

## 2021-04-18 MED ORDER — OXYCODONE HCL 5 MG PO TABS: 5.0000 mg | ORAL_TABLET | ORAL | Status: DC | PRN

## 2021-04-18 MED ORDER — FENTANYL CITRATE (PF) 100 MCG/2ML IJ SOLN
INTRAMUSCULAR | Status: AC
  Filled 2021-04-18: qty 2

## 2021-04-18 MED ORDER — SODIUM CHLORIDE 0.9 % IV SOLN
INTRAVENOUS | Status: AC
  Filled 2021-04-18: qty 10

## 2021-04-18 MED ORDER — LIDOCAINE HCL (CARDIAC) PF 100 MG/5ML IV SOSY
PREFILLED_SYRINGE | INTRAVENOUS | Status: DC | PRN
  Administered 2021-04-18: 100 mg via INTRAVENOUS

## 2021-04-18 MED ORDER — SUCCINYLCHOLINE CHLORIDE 200 MG/10ML IV SOSY
PREFILLED_SYRINGE | INTRAVENOUS | Status: AC
  Filled 2021-04-18: qty 10

## 2021-04-18 MED ORDER — MIDAZOLAM HCL 5 MG/5ML IJ SOLN
INTRAMUSCULAR | Status: DC | PRN
  Administered 2021-04-18: 2 mg via INTRAVENOUS

## 2021-04-18 MED ORDER — LACTATED RINGERS IV SOLN: INTRAVENOUS | Status: DC

## 2021-04-18 MED ORDER — ONDANSETRON HCL 4 MG/2ML IJ SOLN
INTRAMUSCULAR | Status: AC
  Filled 2021-04-18: qty 2

## 2021-04-18 MED ORDER — PROMETHAZINE HCL 25 MG/ML IJ SOLN
12.5000 mg | Freq: Four times a day (QID) | INTRAMUSCULAR | Status: AC | PRN
Start: 2021-04-18 — End: 2021-04-18
  Administered 2021-04-18: 12.5 mg via INTRAVENOUS

## 2021-04-18 MED ORDER — ONDANSETRON HCL 4 MG/2ML IJ SOLN
INTRAMUSCULAR | Status: DC | PRN
  Administered 2021-04-18: 4 mg via INTRAVENOUS

## 2021-04-18 MED ORDER — CEFAZOLIN SODIUM-DEXTROSE 2-4 GM/100ML-% IV SOLN
2.0000 g | INTRAVENOUS | Status: AC
  Administered 2021-04-18: 2 g via INTRAVENOUS

## 2021-04-18 MED ORDER — BUPIVACAINE HCL (PF) 0.25 % IJ SOLN
INTRAMUSCULAR | Status: AC
  Filled 2021-04-18: qty 60

## 2021-04-18 MED ORDER — SODIUM CHLORIDE 0.9% FLUSH: 3.0000 mL | INTRAVENOUS | Status: DC | PRN

## 2021-04-18 MED ORDER — PROMETHAZINE HCL 25 MG/ML IJ SOLN
INTRAMUSCULAR | Status: AC
  Filled 2021-04-18: qty 1

## 2021-04-18 MED ORDER — FENTANYL CITRATE (PF) 100 MCG/2ML IJ SOLN
INTRAMUSCULAR | Status: DC | PRN
  Administered 2021-04-18 (×2): 50 ug via INTRAVENOUS

## 2021-04-18 MED ORDER — BUPIVACAINE-EPINEPHRINE 0.25% -1:200000 IJ SOLN
INTRAMUSCULAR | Status: DC | PRN
  Administered 2021-04-18: 17 mL

## 2021-04-18 MED ORDER — SODIUM CHLORIDE 0.9% FLUSH: 3.0000 mL | Freq: Two times a day (BID) | INTRAVENOUS | Status: DC

## 2021-04-18 MED ORDER — SODIUM CHLORIDE 0.9 % IV SOLN
INTRAVENOUS | Status: DC | PRN
  Administered 2021-04-18: 200 mL

## 2021-04-18 MED ORDER — OXYCODONE HCL 5 MG PO TABS: 5.0000 mg | ORAL_TABLET | Freq: Once | ORAL | Status: DC | PRN

## 2021-04-18 MED ORDER — CHLORHEXIDINE GLUCONATE 4 % EX LIQD: 1.0000 "application " | Freq: Once | CUTANEOUS | Status: DC

## 2021-04-18 MED ORDER — ACETAMINOPHEN 325 MG RE SUPP: 650.0000 mg | RECTAL | Status: DC | PRN

## 2021-04-18 MED ORDER — ACETAMINOPHEN 325 MG PO TABS: 650.0000 mg | ORAL_TABLET | ORAL | Status: DC | PRN

## 2021-04-18 MED ORDER — OXYCODONE HCL 5 MG/5ML PO SOLN: 5.0000 mg | Freq: Once | ORAL | Status: DC | PRN

## 2021-04-18 MED ORDER — DEXAMETHASONE SODIUM PHOSPHATE 10 MG/ML IJ SOLN
INTRAMUSCULAR | Status: AC
  Filled 2021-04-18: qty 1

## 2021-04-18 MED ORDER — DROPERIDOL 2.5 MG/ML IJ SOLN
INTRAMUSCULAR | Status: DC | PRN
  Administered 2021-04-18: .625 mg via INTRAVENOUS

## 2021-04-18 MED ORDER — ACETAMINOPHEN 10 MG/ML IV SOLN: 1000.0000 mg | Freq: Once | INTRAVENOUS | Status: DC | PRN

## 2021-04-18 MED ORDER — CEFAZOLIN SODIUM-DEXTROSE 2-4 GM/100ML-% IV SOLN
INTRAVENOUS | Status: AC
  Filled 2021-04-18: qty 100

## 2021-04-18 MED ORDER — EPHEDRINE 5 MG/ML INJ
INTRAVENOUS | Status: AC
  Filled 2021-04-18: qty 10

## 2021-04-18 MED ORDER — MIDAZOLAM HCL 2 MG/2ML IJ SOLN
INTRAMUSCULAR | Status: AC
  Filled 2021-04-18: qty 2

## 2021-04-18 SURGICAL SUPPLY — 70 items
ADH SKN CLS APL DERMABOND .7 (GAUZE/BANDAGES/DRESSINGS) ×2
BAG DECANTER FOR FLEXI CONT (MISCELLANEOUS) ×2 IMPLANT
BINDER BREAST LRG (GAUZE/BANDAGES/DRESSINGS) ×2 IMPLANT
BINDER BREAST MEDIUM (GAUZE/BANDAGES/DRESSINGS) IMPLANT
BINDER BREAST XLRG (GAUZE/BANDAGES/DRESSINGS) IMPLANT
BINDER BREAST XXLRG (GAUZE/BANDAGES/DRESSINGS) IMPLANT
BIOPATCH RED 1 DISK 7.0 (GAUZE/BANDAGES/DRESSINGS) ×4 IMPLANT
BLADE HEX COATED 2.75 (ELECTRODE) ×2 IMPLANT
BLADE SURG 15 STRL LF DISP TIS (BLADE) ×2 IMPLANT
BLADE SURG 15 STRL SS (BLADE) ×4
BNDG GAUZE ELAST 4 BULKY (GAUZE/BANDAGES/DRESSINGS) ×4 IMPLANT
CANISTER SUCT 1200ML W/VALVE (MISCELLANEOUS) ×2 IMPLANT
COVER BACK TABLE 60X90IN (DRAPES) ×2 IMPLANT
COVER MAYO STAND STRL (DRAPES) ×2 IMPLANT
DECANTER SPIKE VIAL GLASS SM (MISCELLANEOUS) IMPLANT
DERMABOND ADVANCED (GAUZE/BANDAGES/DRESSINGS) ×2
DERMABOND ADVANCED .7 DNX12 (GAUZE/BANDAGES/DRESSINGS) ×2 IMPLANT
DRAIN CHANNEL 19F RND (DRAIN) IMPLANT
DRAPE LAPAROSCOPIC ABDOMINAL (DRAPES) ×2 IMPLANT
DRSG OPSITE POSTOP 4X6 (GAUZE/BANDAGES/DRESSINGS) ×4 IMPLANT
DRSG PAD ABDOMINAL 8X10 ST (GAUZE/BANDAGES/DRESSINGS) ×4 IMPLANT
ELECT BLADE 4.0 EZ CLEAN MEGAD (MISCELLANEOUS)
ELECT BLADE 6.5 EXT (BLADE) IMPLANT
ELECT REM PT RETURN 9FT ADLT (ELECTROSURGICAL) ×2
ELECTRODE BLDE 4.0 EZ CLN MEGD (MISCELLANEOUS) IMPLANT
ELECTRODE REM PT RTRN 9FT ADLT (ELECTROSURGICAL) ×1 IMPLANT
EVACUATOR SILICONE 100CC (DRAIN) ×4 IMPLANT
GLOVE SURG ENC MOIS LTX SZ6 (GLOVE) ×4 IMPLANT
GOWN STRL REUS W/ TWL LRG LVL3 (GOWN DISPOSABLE) ×3 IMPLANT
GOWN STRL REUS W/TWL LRG LVL3 (GOWN DISPOSABLE) ×6
IV NS 1000ML (IV SOLUTION)
IV NS 1000ML BAXH (IV SOLUTION) IMPLANT
IV NS 500ML (IV SOLUTION)
IV NS 500ML BAXH (IV SOLUTION) IMPLANT
KIT FILL SYSTEM UNIVERSAL (SET/KITS/TRAYS/PACK) IMPLANT
NDL SAFETY ECLIPSE 18X1.5 (NEEDLE) IMPLANT
NEEDLE HYPO 18GX1.5 SHARP (NEEDLE)
NEEDLE HYPO 25X1 1.5 SAFETY (NEEDLE) ×2 IMPLANT
NS IRRIG 1000ML POUR BTL (IV SOLUTION) ×2 IMPLANT
PACK BASIN DAY SURGERY FS (CUSTOM PROCEDURE TRAY) ×2 IMPLANT
PENCIL SMOKE EVACUATOR (MISCELLANEOUS) ×2 IMPLANT
PIN SAFETY STERILE (MISCELLANEOUS) ×2 IMPLANT
SLEEVE SCD COMPRESS KNEE MED (STOCKING) ×2 IMPLANT
SPONGE T-LAP 18X18 ~~LOC~~+RFID (SPONGE) ×2 IMPLANT
STAPLER VISISTAT 35W (STAPLE) IMPLANT
STRIP CLOSURE SKIN 1/2X4 (GAUZE/BANDAGES/DRESSINGS) ×4 IMPLANT
STRIP SUTURE WOUND CLOSURE 1/2 (MISCELLANEOUS) IMPLANT
SUT MNCRL AB 4-0 PS2 18 (SUTURE) IMPLANT
SUT MON AB 3-0 SH 27 (SUTURE) ×4
SUT MON AB 3-0 SH27 (SUTURE) ×2 IMPLANT
SUT MON AB 5-0 PS2 18 (SUTURE) ×2 IMPLANT
SUT PDS 3-0 CT2 (SUTURE) ×4
SUT PDS AB 2-0 CT2 27 (SUTURE) IMPLANT
SUT PDS II 3-0 CT2 27 ABS (SUTURE) ×2 IMPLANT
SUT PROLENE 3 0 PS 2 (SUTURE) IMPLANT
SUT SILK 3 0 PS 1 (SUTURE) IMPLANT
SUT SILK 4 0 PS 2 (SUTURE) ×4 IMPLANT
SUT VIC AB 3-0 SH 27 (SUTURE)
SUT VIC AB 3-0 SH 27X BRD (SUTURE) IMPLANT
SUT VICRYL 4-0 PS2 18IN ABS (SUTURE) ×4 IMPLANT
SWAB COLLECTION DEVICE MRSA (MISCELLANEOUS) IMPLANT
SWAB CULTURE ESWAB REG 1ML (MISCELLANEOUS) IMPLANT
SYR 50ML LL SCALE MARK (SYRINGE) IMPLANT
SYR BULB IRRIG 60ML STRL (SYRINGE) ×2 IMPLANT
SYR CONTROL 10ML LL (SYRINGE) ×2 IMPLANT
TOWEL GREEN STERILE FF (TOWEL DISPOSABLE) ×2 IMPLANT
TRAY DSU PREP LF (CUSTOM PROCEDURE TRAY) ×2 IMPLANT
TUBE CONNECTING 20X1/4 (TUBING) ×2 IMPLANT
UNDERPAD 30X36 HEAVY ABSORB (UNDERPADS AND DIAPERS) ×4 IMPLANT
YANKAUER SUCT BULB TIP NO VENT (SUCTIONS) ×2 IMPLANT

## 2021-04-18 NOTE — Discharge Instructions (Addendum)
INSTRUCTIONS FOR AFTER SURGERY   You will likely have some questions about what to expect following your operation.  The following information will help you and your family understand what to expect when you are discharged from the hospital.  Following these guidelines will help ensure a smooth recovery and reduce risks of complications.  Postoperative instructions include information on: diet, wound care, medications and physical activity.  AFTER SURGERY Expect to go home after the procedure.  In some cases, you may need to spend one night in the hospital for observation.  DIET This surgery does not require a specific diet.  However, I have to mention that the healthier you eat the better your body can start healing. It is important to increasing your protein intake.  This means limiting the foods with added sugar.  Focus on fruits and vegetables and some meat. It is very important to drink water after your surgery.  If your urine is bright yellow, then it is concentrated, and you need to drink more water.  As a general rule after surgery, you should have 8 ounces of water every hour while awake.  If you find you are persistently nauseated or unable to take in liquids let us know.  NO TOBACCO USE or EXPOSURE.  This will slow your healing process and increase the risk of a wound.  WOUND CARE If you don't have a drain: You can shower the day after surgery.  Use fragrance free soap.  Dial, Ram[place], [place] and Cetaphil are usually mild on the skin.  If you have a drain: Clean with baby wipes for 3-5 days and then you can shower.  If you have a binder you may remove it to shower and then put it back on. If you have steri-strips / tape directly attached to your skin leave them in place. It is OK to get these wet.  No baths, pools or hot tubs for two weeks. We close your incision to leave the smallest and best-looking scar. No ointment or creams on your incisions until given the go ahead.  Especially not  Neosporin (Too many skin reactions with this one).  A few weeks after surgery you can use Mederma and start massaging the scar. We ask you to wear your binder or sports bra for the first 6 weeks around the clock, including while sleeping. This provides added comfort and helps reduce the fluid accumulation at the surgery site.  ACTIVITY No heavy lifting until cleared by the doctor.  It is OK to walk and climb stairs. In fact, moving your legs is very important to decrease your risk of a blood clot.  It will also help keep you from getting deconditioned.  Every 1 to 2 hours get up and walk for 5 minutes. This will help with a quicker recovery back to normal.  Let pain be your guide so you don't do too much.  NO, you cannot do the spring cleaning and don't plan on taking care of anyone else.  This is your time for TLC.   WORK Everyone returns to work at different times. As a rough guide, most people take at least 1 - 2 weeks off prior to returning to work. If you need documentation for your job, bring the forms to your postoperative follow up visit.  DRIVING Arrange for someone to bring you home from the hospital.  You may be able to drive a few days after surgery but not while taking any narcotics or valium.  BOWEL  MOVEMENTS Constipation can occur after anesthesia and while taking pain medication.  It is important to stay ahead for your comfort.  We recommend taking Milk of Magnesia (2 tablespoons; twice a day) while taking the pain pills.  SEROMA This is fluid your body tried to put in the surgical site.  This is normal but if it creates excessive pain and swelling let us know.  It usually decreases in a few weeks.  MEDICATIONS and PAIN CONTROL At your preoperative visit for you history and physical you were given the following medications: An antibiotic: Start this medication when you get home and take according to the instructions on the bottle. Zofran 4 mg:  This is to treat nausea and  vomiting.  You can take this every 6 hours as needed and only if needed. Norco (hydrocodone/acetaminophen) 5/325 mg:  This is only to be used after you have taken the motrin or the tylenol. Every 8 hours as needed. Over the counter Medication to take: Ibuprofen (Motrin) 600 mg:  Take this every 6 hours.  If you have additional pain then take 500 mg of the tylenol.  Only take the Norco after you have tried these two. Miralax or stool softener of choice: Take this according to the bottle if you take the [place] Call your surgeon's office if any of the following occur:  Fever 101 degrees F or greater  Excessive bleeding or fluid from the incision site.  Pain that increases over time without aid from the medications  Redness, warmth, or pus draining from incision sites  Persistent nausea or inability to take in liquids  Severe misshapen area that underwent the operation.  [facility]ist  What is the benefit of having a drain?  During surgery your tissue layers are separated.  This raw surface stimulates your body to fill the space with serous fluid.  This is normal but you don't want that fluid to collect and prevent healing.  A fluid collection can also become infected.  The Jackson-Pratt (JP) drain is used to eliminate this collection of fluid and allow the tissue to heal together.    Jackson-Pratt (JP) bulb    How to care for your drainage and suction unit at home Your drainage catheter will be connected to a collection device. The vacuum caused when the device is compressed allows drainage to collect in the device.    Wash your hands with soap and water before and after touching the system. Empty the JP drain every 12 hours once you get home from your procedure. Record the fluid amount on the record sheet included. Start with stripping the drain tube to push the clots or excess fluid to the bulb.  Do this by pinching the tube with one hand near your skin.   Then with the other hand squeeze the tubing and work it toward the bulb.  This should be done several times a day.  This may collapse the tube which will correct on its own.   Use a safety pin to attach your collection device to your clothing so there is no tension on the insertion site.   If you have drainage at the skin insertion site, you can apply a gauze dressing and secure it with tape. If the drain falls out, apply a gauze dressing over the drain insertion site and secure with tape.   To empty the collection device:   Release the stopper on the top of the collection unit (bulb).  Pour contents into a measuring container such as a plastic medicine cup.  Record the day and amount of drainage on the attached sheet. This should be done at least twice a day.    To compress the Jackson-Pratt Bulb:  Release the stopper at the top of the bulb. Squeeze the bulb tightly in your fist, squeezing air out of the bulb.  Replace the stopper while the bulb is compressed.  Be careful not to spill the contents when squeezing the bulb. The drainage will start bright red and turn to pink and then yellow with time. IMPORTANT: If the bulb is not squeezed before adding the stopper it will not draw out the fluid.  Care for the JP drain site and your skin daily:  You may shower three days after surgery. Secure the drain to a ribbon or cloth around your waist while showering so it does not pull out while showering. Be sure your hands are cleaned with soap and water. Use a clean wet cotton swab to clean the skin around the drain site.  Use another cotton swab to place Vaseline or antibiotic ointment on the skin around the drain.     Contact your physician if any of the following occur:  The fluid in the bulb becomes cloudy. Your temperature is greater than 101.4.  The incision opens. If you have drainage at the skin insertion site, you can apply a gauze dressing and secure it with tape. If the drain falls  out, apply a gauze dressing over the drain insertion site and secure with tape.  You will usually have more drainage when you are active than while you rest or are asleep. If the drainage increases significantly or is bloody call the physician                             Bring this record with you to each office visit Date  Drainage Volume  Date   Drainage volume                                                                                                                                                                                          Post Anesthesia Home Care Instructions  Activity: Get plenty of rest for the remainder of the day. A responsible individual must stay with you for 24 hours following the procedure.  For the next 24 hours, DO NOT: -Drive a car -Paediatric nurse -Drink alcoholic beverages -Take any medication unless instructed by your physician -Make any legal decisions or sign important papers.  Meals: Start with liquid foods such as  gelatin or soup. Progress to regular foods as tolerated. Avoid greasy, spicy, heavy foods. If nausea and/or vomiting occur, drink only clear liquids until the nausea and/or vomiting subsides. Call your physician if vomiting continues.  Special Instructions/Symptoms: Your throat may feel dry or sore from the anesthesia or the breathing tube placed in your throat during surgery. If this causes discomfort, gargle with warm salt water. The discomfort should disappear within 24 hours.  If you had a scopolamine patch placed behind your ear for the management of post- operative nausea and/or vomiting:  1. The medication in the patch is effective for 72 hours, after which it should be removed.  Wrap patch in a tissue and discard in the trash. Wash hands thoroughly with soap and water. 2. You may remove the patch earlier than 72 hours if you experience unpleasant side effects which may include dry mouth, dizziness or  visual disturbances. 3. Avoid touching the patch. Wash your hands with soap and water after contact with the patch.

## 2021-04-18 NOTE — Interval H&P Note (Signed)
History and Physical Interval Note:  04/18/2021 7:43 AM  Miranda Greene  has presented today for surgery, with the diagnosis of history of breast cancer.  The various methods of treatment have been discussed with the patient and family. After consideration of risks, benefits and other options for treatment, the patient has consented to  Procedure(s): REMOVAL BREAST IMPLANTS (Bilateral) CAPSULECTOMY (Bilateral) as a surgical intervention.  The patient's history has been reviewed, patient examined, no change in status, stable for surgery.  I have reviewed the patient's chart and labs.  Questions were answered to the patient's satisfaction.     Loel Lofty Richa Shor

## 2021-04-18 NOTE — Anesthesia Postprocedure Evaluation (Signed)
Anesthesia Post Note  Patient: Miranda Greene  Procedure(s) Performed: REMOVAL BREAST IMPLANTS (Bilateral: Breast) CAPSULECTOMY (Bilateral: Breast)     Patient location during evaluation: PACU Anesthesia Type: General Level of consciousness: awake and alert Pain management: pain level controlled Vital Signs Assessment: post-procedure vital signs reviewed and stable Respiratory status: spontaneous breathing, nonlabored ventilation, respiratory function stable and patient connected to nasal cannula oxygen Cardiovascular status: blood pressure returned to baseline and stable Postop Assessment: no apparent nausea or vomiting Anesthetic complications: no   No notable events documented.  Last Vitals:  Vitals:   04/18/21 1100 04/18/21 1115  BP: (!) 150/86   Pulse: 85 76  Resp: (!) 24 14  Temp:    SpO2: 95% 94%    Last Pain:  Vitals:   04/18/21 1148  TempSrc:   PainSc: 0-No pain                 Kadra Kohan S

## 2021-04-18 NOTE — Anesthesia Preprocedure Evaluation (Signed)
Anesthesia Evaluation  Patient identified by MRN, date of birth, ID band Patient awake    Reviewed: Allergy & Precautions, NPO status , Patient's Chart, lab work & pertinent test results  Airway Mallampati: II  TM Distance: >3 FB Neck ROM: Full    Dental no notable dental hx.    Pulmonary neg pulmonary ROS,    Pulmonary exam normal breath sounds clear to auscultation       Cardiovascular negative cardio ROS Normal cardiovascular exam Rhythm:Regular Rate:Normal     Neuro/Psych negative neurological ROS  negative psych ROS   GI/Hepatic Neg liver ROS, GERD  Medicated,  Endo/Other  diabetes, Type 2, Insulin Dependent  Renal/GU negative Renal ROS  negative genitourinary   Musculoskeletal negative musculoskeletal ROS (+)   Abdominal   Peds negative pediatric ROS (+)  Hematology negative hematology ROS (+)   Anesthesia Other Findings   Reproductive/Obstetrics negative OB ROS                             Anesthesia Physical Anesthesia Plan  ASA: 3  Anesthesia Plan: General   Post-op Pain Management:    Induction: Intravenous  PONV Risk Score and Plan: 3 and Ondansetron, Midazolam, Droperidol and Treatment may vary due to age or medical condition  Airway Management Planned: Oral ETT and LMA  Additional Equipment:   Intra-op Plan:   Post-operative Plan: Extubation in OR  Informed Consent: I have reviewed the patients History and Physical, chart, labs and discussed the procedure including the risks, benefits and alternatives for the proposed anesthesia with the patient or authorized representative who has indicated his/her understanding and acceptance.     Dental advisory given  Plan Discussed with: CRNA and Surgeon  Anesthesia Plan Comments:         Anesthesia Quick Evaluation

## 2021-04-18 NOTE — Op Note (Addendum)
Op report    DATE OF OPERATION: 04/18/2021  LOCATION: [place]  SURGICAL DIVISION: Plastic Surgery  PREOPERATIVE DIAGNOSIS:  History of breast cancer.  Acquired absence of bilateral breast.   POSTOPERATIVE DIAGNOSIS:  1. History of breast cancer.  2. Acquired absence of bilateral breast.   PROCEDURE:  1. Bilateral removal of breast implants.  2. Bilateral capsulectomies. 3. Excision of 2 x 8 cm skin of each breast.  SURGEON: Kailah Pennel Sanger Jaelyn Bourgoin, DO  ASSISTANT: Roetta Sessions, PA  ANESTHESIA:  General.   COMPLICATIONS: None.   INDICATIONS FOR PROCEDURE:  The patient, Miranda Greene, is a 65 y.o. female born on 08/13/1956, is here for treatment after bilateral mastectomies.  She had tissue expanders placed followed by implants.  She complains of pain and discomfort in her breasts.  She made the difficult decision to remove the implants and excess skin.  MRN: [id number]  CONSENT:  Informed consent was obtained directly from the patient. Risks, benefits and alternatives were fully discussed. Specific risks including but not limited to bleeding, infection, hematoma, seroma, scarring, pain, implant infection, implant extrusion, capsular contracture, asymmetry, wound healing problems, and need for further surgery were all discussed. The patient did have an ample opportunity to have her questions answered to her satisfaction.   DESCRIPTION OF PROCEDURE:  The patient was taken to the operating room. SCDs were placed and IV antibiotics were given. The patient's chest was prepped and draped in a sterile fashion. A time out was performed and the implants to be used were identified.    Right breast: One percent Lidocaine with epinephrine was used to infiltrate at the incision site. The old mastectomy scar and skin was excised by 2 x 8 cm.  The mastectomy flaps from the superior and inferior flaps were raised over the pectoralis major muscle for 1 cm to minimize  tension for the closure. The capsule was split inferior to the skin incision to expose and remove the implant (Silicone [id number]).  The capsule was excised to the extent able to minimize bleeding.  The capsule was very thin on both the deep and superficial side. The pocket was irrigated with antibiotic solution. A drain was placed and secured to the chest wall with the 4-0 Silk.  The deep layer was closed with a 3-0 Monocryl suture. The remaining skin was closed with 4-0 Monocryl.   Left breast:   One percent Lidocaine with epinephrine was used to infiltrate at the incision site. The old mastectomy scar and skin was excised by 2 x 8 cm.  The mastectomy flaps from the superior and inferior flaps were raised over the pectoralis major muscle for 1 cm to minimize tension for the closure. The capsule was split inferior to the skin incision to expose and remove the implant.  The capsule was excised to the extent able to minimize bleeding.  The capsule was very thin on both the deep and superficial side. The pocket was irrigated with antibiotic solution. I did not tac the muscle as I was concerned it would pull with arm abduction. A drain was placed and secured to the chest wall with the 4-0 Silk.  The deep layer was closed with a 3-0 Monocryl suture. The remaining skin was closed with 4-0 Monocryl. Dermabond was applied to the incision site. A breast binder and ABDs were placed.  The patient was allowed to wake from anesthesia and taken to the recovery room in satisfactory condition.   The advanced practice practitioner (APP)  assisted throughout the case.  The APP was essential in retraction and counter traction when needed to make the case progress smoothly.  This retraction and assistance made it possible to see the tissue plans for the procedure.  The assistance was needed for blood control, tissue re-approximation and assisted with closure of the incision site.

## 2021-04-18 NOTE — Anesthesia Procedure Notes (Signed)
Procedure Name: LMA Insertion Date/Time: 04/18/2021 8:32 AM Performed by: Willa Frater, CRNA Pre-anesthesia Checklist: Patient identified, Emergency Drugs available, Suction available and Patient being monitored Patient Re-evaluated:Patient Re-evaluated prior to induction Oxygen Delivery Method: Circle system utilized Preoxygenation: Pre-oxygenation with 100% oxygen Induction Type: IV induction Ventilation: Mask ventilation without difficulty LMA: LMA inserted LMA Size: 4.0 Number of attempts: 1 Airway Equipment and Method: Bite block Placement Confirmation: positive ETCO2 Tube secured with: Tape Dental Injury: Teeth and Oropharynx as per pre-operative assessment

## 2021-04-18 NOTE — Transfer of Care (Signed)
Immediate Anesthesia Transfer of Care Note  Patient: Miranda Greene  Procedure(s) Performed: REMOVAL BREAST IMPLANTS (Bilateral: Breast) CAPSULECTOMY (Bilateral: Breast)  Patient Location: PACU  Anesthesia Type:General  Level of Consciousness: awake, alert , oriented, drowsy and patient cooperative  Airway & Oxygen Therapy: Patient Spontanous Breathing and Patient connected to face mask oxygen  Post-op Assessment: Report given to RN and Post -op Vital signs reviewed and stable  Post vital signs: Reviewed and stable  Last Vitals:  Vitals Value Taken Time  BP 162/84 04/18/21 0957  Temp    Pulse 83 04/18/21 0958  Resp 13 04/18/21 0958  SpO2 99 % 04/18/21 0958  Vitals shown include unvalidated device data.  Last Pain:  Vitals:   04/18/21 0714  TempSrc: Oral  PainSc: 0-No pain         Complications: No notable events documented.

## 2021-04-19 ENCOUNTER — Encounter (HOSPITAL_BASED_OUTPATIENT_CLINIC_OR_DEPARTMENT_OTHER): Payer: Self-pay | Admitting: Plastic Surgery

## 2021-04-23 ENCOUNTER — Telehealth: Payer: Self-pay | Admitting: Plastic Surgery

## 2021-04-23 NOTE — Telephone Encounter (Signed)
Returned patient's call. Patient was not available, spoke with husband. Advised this is a very common issue with patients who have a drain in place.  However, we can not remove  any earlier then her next appointment on 04/27/2021 at 9:40 with [facility]. Suggested to apply Vaseline to the skin around the opening of drain area, take a 4x4 gauze, fold in half, cut 1 inch the middle of the gauze, place under the drain for support, and wrap around and tape to secure. Spouse understood and agreed with plan.

## 2021-04-23 NOTE — Telephone Encounter (Signed)
Patient called to say that the area her drains are at is really red and sore and achey. She would like to know if they can be taken out early. Please call to advise

## 2021-04-25 LAB — SURGICAL PATHOLOGY

## 2021-04-26 NOTE — Progress Notes (Signed)
Patient is a 65 year old female here for follow-up after removal of bilateral breast implants and bilateral capsulectomies with Dr. Marla Roe on 04/18/2021.  Implants were removed due to chest wall tenderness and rib tenderness unimproved with stretching and exercising.  She is 1 week postop.  Patient reports she is doing really well, drain output has been 20 cc or less per 24 hours bilaterally.  She is not having any infectious symptoms.  She reports that she had almost immediate relief after removing the implants.  Chaperone present on exam On exam bilateral breast incisions appear intact, Steri-Strips and honeycomb dressing in place.  She does have some bruising along the right inferior mastectomy flap but no skin necrosis is noted.  No subcutaneous fluid collections or hematoma noted with palpation.  Recommend continue to wear compressive garment 24/7.  Continue to avoid strenuous activities.  Recommend following up in a few weeks for reevaluation.  Bilateral JP drains were removed.  No sign of infection, seroma, hematoma.

## 2021-04-27 ENCOUNTER — Other Ambulatory Visit: Payer: Self-pay

## 2021-04-27 ENCOUNTER — Ambulatory Visit (INDEPENDENT_AMBULATORY_CARE_PROVIDER_SITE_OTHER): Payer: BC Managed Care – PPO | Admitting: Surgical

## 2021-04-27 DIAGNOSIS — Z9889 Other specified postprocedural states: Secondary | ICD-10-CM

## 2021-05-22 ENCOUNTER — Other Ambulatory Visit: Payer: Self-pay

## 2021-05-22 ENCOUNTER — Ambulatory Visit (INDEPENDENT_AMBULATORY_CARE_PROVIDER_SITE_OTHER): Payer: BC Managed Care – PPO | Admitting: Plastic Surgery

## 2021-05-22 ENCOUNTER — Encounter: Payer: Self-pay | Admitting: Plastic Surgery

## 2021-05-22 DIAGNOSIS — Z9889 Other specified postprocedural states: Secondary | ICD-10-CM

## 2021-05-22 NOTE — Progress Notes (Signed)
The patient is a 65 year old female here for follow-up on her bilateral breast surgery.  She had breast cancer of the left breast and underwent bilateral mastectomies.  She had reconstruction and looked really good.  She complained of constant pain and decided that she wanted to have the implants removed.  Those have been removed and today was a follow-up.  She had them removed August 10.  She is extremely happy and very pleased.  She says her pain is gone and she feels much better.  Her incisions are healing very nicely.  There is no sign of hematoma or seroma.  We will plan to see her back in 1 year for follow-up unless she needs [place] sooner she is most welcome to come.  Pictures were obtained of the patient and placed in the chart with the patient's or guardian's permission.

## 2021-06-12 ENCOUNTER — Other Ambulatory Visit: Payer: Self-pay

## 2021-06-12 ENCOUNTER — Ambulatory Visit (INDEPENDENT_AMBULATORY_CARE_PROVIDER_SITE_OTHER): Payer: BC Managed Care – PPO | Admitting: Family Medicine

## 2021-06-12 VITALS — BP 122/84 | HR 78 | Temp 96.3°F | Ht 63.75 in | Wt 132.4 lb

## 2021-06-12 DIAGNOSIS — E119 Type 2 diabetes mellitus without complications: Secondary | ICD-10-CM

## 2021-06-12 DIAGNOSIS — E1165 Type 2 diabetes mellitus with hyperglycemia: Secondary | ICD-10-CM

## 2021-06-12 DIAGNOSIS — Z789 Other specified health status: Secondary | ICD-10-CM

## 2021-06-12 LAB — POCT GLYCOSYLATED HEMOGLOBIN (HGB A1C): Hemoglobin A1C: 7.8 % — AB (ref 4.0–5.6)

## 2021-06-12 LAB — HM DIABETES FOOT EXAM

## 2021-06-12 NOTE — Progress Notes (Signed)
Patient ID: Miranda Greene, female    DOB: [birth year], 65 y.o.   MRN: [id number]  This visit was conducted in person.  BP 122/84   Pulse 78   Temp (!) 96.3 F (35.7 C)   Ht 5' 3.75" (1.619 m)   Wt 132 lb 6 oz (60 kg)   SpO2 97%   BMI 22.90 kg/m    CC:  Chief Complaint  Patient presents with   Diabetes     Subjective:   HPI: Miranda Greene is a 65 y.o. female presenting on 06/12/2021 for Diabetes   She had a successful removal of breast implant.  Diabetes:  Improving A1C down from 8.3 after  3 months of lifestyle change and following low carb diet more closely.  Checking 2 hour postprandials.  On max metformin and 16 units of long acting glargine insulin Lab Results  Component Value Date   HGBA1C 7.8 (A) 06/12/2021  Using medications without difficulties: Hypoglycemic episodes: none Hyperglycemic episodes:  occ Feet problems: none Blood Sugars averaging: FBS 87-120, postprandial 180-200 eye exam within last year: yes 07/2020   Hx of statin intolerance.. considering trial of pravastatin along with CoQ10.   Relevant past medical, surgical, family and social history reviewed and updated as indicated. Interim medical history since our last visit reviewed. Allergies and medications reviewed and updated. Outpatient Medications Prior to Visit  Medication Sig Dispense Refill   ibuprofen (ADVIL,MOTRIN) 200 MG tablet Take 200 mg by mouth every 6 (six) hours as needed for moderate pain.      insulin glargine-yfgn (SEMGLEE, YFGN,) 100 UNIT/ML Pen Inject 13 Units into the skin at bedtime. 15 mL 1   metFORMIN (GLUCOPHAGE) 1000 MG tablet TAKE 1 TABLET BY MOUTH TWICE DAILY WITH A MEAL FOR DIABETES 180 tablet 2   omeprazole (PRILOSEC) 20 MG capsule Take 20 mg by mouth daily.     No facility-administered medications prior to visit.     Per HPI unless specifically indicated in ROS section below Review of Systems  Constitutional:  Negative for fatigue and fever.  HENT:   Negative for ear pain.   Eyes:  Negative for pain.  Respiratory:  Negative for chest tightness and shortness of breath.   Cardiovascular:  Negative for chest pain, palpitations and leg swelling.  Gastrointestinal:  Negative for abdominal pain.  Genitourinary:  Negative for dysuria.  Objective:  BP 122/84   Pulse 78   Temp (!) 96.3 F (35.7 C)   Ht 5' 3.75" (1.619 m)   Wt 132 lb 6 oz (60 kg)   SpO2 97%   BMI 22.90 kg/m   Wt Readings from Last 3 Encounters:  06/12/21 132 lb 6 oz (60 kg)  04/18/21 135 lb 5.8 oz (61.4 kg)  03/29/21 137 lb (62.1 kg)      Physical Exam Constitutional:      General: She is not in acute distress.    Appearance: Normal appearance. She is well-developed. She is not ill-appearing or toxic-appearing.  HENT:     Head: Normocephalic.     Right Ear: Hearing, tympanic membrane, ear canal and external ear normal. Tympanic membrane is not erythematous, retracted or bulging.     Left Ear: Hearing, tympanic membrane, ear canal and external ear normal. Tympanic membrane is not erythematous, retracted or bulging.     Nose: No mucosal edema or rhinorrhea.     Right Sinus: No maxillary sinus tenderness or frontal sinus tenderness.     Left Sinus: No maxillary  sinus tenderness or frontal sinus tenderness.     Mouth/Throat:     Pharynx: Uvula midline.  Eyes:     General: Lids are normal. Lids are everted, no foreign bodies appreciated.     Conjunctiva/sclera: Conjunctivae normal.     Pupils: Pupils are equal, round, and reactive to light.  Neck:     Thyroid: No thyroid mass or thyromegaly.     Vascular: No carotid bruit.     Trachea: Trachea normal.  Cardiovascular:     Rate and Rhythm: Normal rate and regular rhythm.     Pulses: Normal pulses.     Heart sounds: Normal heart sounds, S1 normal and S2 normal. No murmur heard.   No friction rub. No gallop.  Pulmonary:     Effort: Pulmonary effort is normal. No tachypnea or respiratory distress.     Breath  sounds: Normal breath sounds. No decreased breath sounds, wheezing, rhonchi or rales.  Abdominal:     General: Bowel sounds are normal.     Palpations: Abdomen is soft.     Tenderness: There is no abdominal tenderness.  Musculoskeletal:     Cervical back: Normal range of motion and neck supple.  Skin:    General: Skin is warm and dry.     Findings: No rash.  Neurological:     Mental Status: She is alert.  Psychiatric:        Mood and Affect: Mood is not anxious or depressed.        Speech: Speech normal.        Behavior: Behavior normal. Behavior is cooperative.        Thought Content: Thought content normal.        Judgment: Judgment normal.      Diabetic foot exam: Normal inspection No skin breakdown No calluses  Normal DP pulses Normal sensation to light touch and monofilament Nails normal  Results for orders placed or performed in visit on 06/12/21  POCT glycosylated hemoglobin (Hb A1C)  Result Value Ref Range   Hemoglobin A1C 7.8 (A) 4.0 - 5.6 %   HbA1c POC (<> result, manual entry)     HbA1c, POC (prediabetic range)     HbA1c, POC (controlled diabetic range)      This visit occurred during the SARS-CoV-2 public health emergency.  Safety protocols were in place, including screening questions prior to the visit, additional usage of staff PPE, and extensive cleaning of exam room while observing appropriate contact time as indicated for disinfecting solutions.   COVID 19 screen:  No recent travel or known exposure to COVID19 The patient denies respiratory symptoms of COVID 19 at this time. The importance of social distancing was discussed today.   Assessment and Plan    Problem List Items Addressed This Visit     Statin intolerance    Continues to refuse trial of pravastatin.. will reconsider the further from her implant removal.      Uncontrolled type 2 diabetes mellitus with hyperglycemia ([place])     Improving control with lifestyle changes and increased dose of  insulin to 16 units daily.  FBS at goal.. no further glargine increase needed.  She may need fasting acting mealtime insulin or  GLP1.       Other Visit Diagnoses     Type 2 diabetes mellitus without complication, without long-term current use of insulin ([place])    -  Primary   Relevant Orders   POCT glycosylated hemoglobin (Hb A1C) (Completed)  Jamise Pentland, MD   

## 2021-06-12 NOTE — Assessment & Plan Note (Signed)
Continues to refuse trial of pravastatin.. will reconsider the further from her implant removal.

## 2021-06-12 NOTE — Assessment & Plan Note (Signed)
Improving control with lifestyle changes and increased dose of insulin to 16 units daily.  FBS at goal.. no further glargine increase needed.  She may need fasting acting mealtime insulin or  GLP1.

## 2021-06-12 NOTE — Patient Instructions (Signed)
Continue working on diet changes. Watch post prandials blood sugar.  Keep up with regular activity.

## 2021-07-09 ENCOUNTER — Telehealth: Payer: Self-pay | Admitting: *Deleted

## 2021-07-09 NOTE — Telephone Encounter (Signed)
Received on (07/03/2021) via of fax DME Standard Written Order from [facility] [place].  Requesting signature and return.  Given to provider to sign.  Order signed and faxed back to [facility] [place].  Confirmation received and copy scanned in to the chart.//AB/CMA

## 2021-07-09 NOTE — Telephone Encounter (Deleted)
Received on (07/03/2021)

## 2021-07-17 ENCOUNTER — Ambulatory Visit: Payer: BC Managed Care – PPO | Admitting: Plastic Surgery

## 2021-08-24 ENCOUNTER — Encounter: Payer: Self-pay | Admitting: Family Medicine

## 2021-08-24 DIAGNOSIS — E119 Type 2 diabetes mellitus without complications: Secondary | ICD-10-CM

## 2021-08-24 DIAGNOSIS — Z86000 Personal history of in-situ neoplasm of breast: Secondary | ICD-10-CM | POA: Diagnosis not present

## 2021-08-24 MED ORDER — METFORMIN HCL 1000 MG PO TABS: ORAL_TABLET | ORAL | 2 refills | Status: DC

## 2021-09-05 ENCOUNTER — Telehealth: Payer: Self-pay | Admitting: Family Medicine

## 2021-09-05 DIAGNOSIS — E1165 Type 2 diabetes mellitus with hyperglycemia: Secondary | ICD-10-CM

## 2021-09-05 NOTE — Telephone Encounter (Signed)
-----   Message from Ellamae Sia sent at 08/28/2021  7:56 AM EST ----- Regarding: Lab orders for Friday, 1.6.23 Lab orders for a 3 month follow up appt.

## 2021-09-14 ENCOUNTER — Other Ambulatory Visit: Payer: BC Managed Care – PPO

## 2021-09-21 ENCOUNTER — Ambulatory Visit: Payer: BC Managed Care – PPO | Admitting: Family Medicine

## 2021-10-09 ENCOUNTER — Ambulatory Visit (INDEPENDENT_AMBULATORY_CARE_PROVIDER_SITE_OTHER): Payer: BC Managed Care – PPO | Admitting: Family Medicine

## 2021-10-09 ENCOUNTER — Other Ambulatory Visit: Payer: Self-pay

## 2021-10-09 ENCOUNTER — Encounter: Payer: Self-pay | Admitting: Family Medicine

## 2021-10-09 VITALS — BP 110/74 | HR 83 | Temp 97.3°F | Ht 63.75 in | Wt 133.1 lb

## 2021-10-09 DIAGNOSIS — E1169 Type 2 diabetes mellitus with other specified complication: Secondary | ICD-10-CM

## 2021-10-09 DIAGNOSIS — Z789 Other specified health status: Secondary | ICD-10-CM | POA: Diagnosis not present

## 2021-10-09 DIAGNOSIS — E1165 Type 2 diabetes mellitus with hyperglycemia: Secondary | ICD-10-CM | POA: Diagnosis not present

## 2021-10-09 DIAGNOSIS — E785 Hyperlipidemia, unspecified: Secondary | ICD-10-CM | POA: Diagnosis not present

## 2021-10-09 LAB — POCT GLYCOSYLATED HEMOGLOBIN (HGB A1C): Hemoglobin A1C: 7.6 % — AB (ref 4.0–5.6)

## 2021-10-09 MED ORDER — SEMGLEE (YFGN) 100 UNIT/ML ~~LOC~~ SOPN: 18.0000 [IU] | PEN_INJECTOR | Freq: Every day | SUBCUTANEOUS | 1 refills | Status: DC

## 2021-10-09 NOTE — Progress Notes (Signed)
Patient ID: Miranda Greene, female    DOB: August 24, 1956, 66 y.o.   MRN: [id number]  This visit was conducted in person.  BP 110/74    Pulse 83    Temp (!) 97.3 F (36.3 C) (Temporal)    Ht 5' 3.75" (1.619 m)    Wt 133 lb 2 oz (60.4 kg)    SpO2 97%    BMI 23.03 kg/m    CC:  Chief Complaint  Patient presents with   Diabetes    Subjective:   HPI: Miranda Greene is a 66 y.o. female presenting on 10/09/2021 for Diabetes   Diabetes:  She has been working on healthy eating, low carb diet, walking up stairs. Lab Results  Component Value Date   HGBA1C 7.6 (A) 10/09/2021   On max metformin and 18 units of long acting glargine insulin Using medications without difficulties: Hypoglycemic episodes: none Hyperglycemic episodes: none Feet problems: no ulcers Blood Sugars averaging: CBS 122 eye exam within last year: Yes  She feels she can work more aggressively on her diet... she is compliant with her meds daily.        Relevant past medical, surgical, family and social history reviewed and updated as indicated. Interim medical history since our last visit reviewed. Allergies and medications reviewed and updated. Outpatient Medications Prior to Visit  Medication Sig Dispense Refill   ibuprofen (ADVIL,MOTRIN) 200 MG tablet Take 200 mg by mouth every 6 (six) hours as needed for moderate pain.      metFORMIN (GLUCOPHAGE) 1000 MG tablet TAKE 1 TABLET BY MOUTH TWICE DAILY WITH A MEAL FOR DIABETES 180 tablet 2   Multiple Vitamins-Minerals (CENTRUM SILVER 50+WOMEN) TABS Take 1 tablet by mouth daily.     omeprazole (PRILOSEC) 20 MG capsule Take 20 mg by mouth daily.     Probiotic Product (PROBIOTIC PO) Take 1 tablet by mouth daily. [place] [employer]     SEMGLEE, YFGN, 100 UNIT/ML Pen Inject 18 Units into the skin at bedtime.     No facility-administered medications prior to visit.     Per HPI unless specifically indicated in ROS section below Review of Systems  Constitutional:  Negative  for fatigue and fever.  HENT:  Negative for congestion.   Eyes:  Negative for pain.  Respiratory:  Negative for cough and shortness of breath.   Cardiovascular:  Negative for chest pain, palpitations and leg swelling.  Gastrointestinal:  Negative for abdominal pain.  Genitourinary:  Negative for dysuria and vaginal bleeding.  Musculoskeletal:  Negative for back pain.  Neurological:  Negative for syncope, light-headedness and headaches.  Psychiatric/Behavioral:  Negative for dysphoric mood.   Objective:  BP 110/74    Pulse 83    Temp (!) 97.3 F (36.3 C) (Temporal)    Ht 5' 3.75" (1.619 m)    Wt 133 lb 2 oz (60.4 kg)    SpO2 97%    BMI 23.03 kg/m   Wt Readings from Last 3 Encounters:  10/09/21 133 lb 2 oz (60.4 kg)  06/12/21 132 lb 6 oz (60 kg)  04/18/21 135 lb 5.8 oz (61.4 kg)      Physical Exam Constitutional:      General: She is not in acute distress.    Appearance: Normal appearance. She is well-developed. She is not ill-appearing or toxic-appearing.  HENT:     Head: Normocephalic.     Right Ear: Hearing, tympanic membrane, ear canal and external ear normal. Tympanic membrane is not erythematous, retracted or  bulging.     Left Ear: Hearing, tympanic membrane, ear canal and external ear normal. Tympanic membrane is not erythematous, retracted or bulging.     Nose: No mucosal edema or rhinorrhea.     Right Sinus: No maxillary sinus tenderness or frontal sinus tenderness.     Left Sinus: No maxillary sinus tenderness or frontal sinus tenderness.     Mouth/Throat:     Pharynx: Uvula midline.  Eyes:     General: Lids are normal. Lids are everted, no foreign bodies appreciated.     Conjunctiva/sclera: Conjunctivae normal.     Pupils: Pupils are equal, round, and reactive to light.  Neck:     Thyroid: No thyroid mass or thyromegaly.     Vascular: No carotid bruit.     Trachea: Trachea normal.  Cardiovascular:     Rate and Rhythm: Normal rate and regular rhythm.     Pulses:  Normal pulses.     Heart sounds: Normal heart sounds, S1 normal and S2 normal. No murmur heard.   No friction rub. No gallop.  Pulmonary:     Effort: Pulmonary effort is normal. No tachypnea or respiratory distress.     Breath sounds: Normal breath sounds. No decreased breath sounds, wheezing, rhonchi or rales.  Abdominal:     General: Bowel sounds are normal.     Palpations: Abdomen is soft.     Tenderness: There is no abdominal tenderness.  Musculoskeletal:     Cervical back: Normal range of motion and neck supple.  Skin:    General: Skin is warm and dry.     Findings: No rash.  Neurological:     Mental Status: She is alert.  Psychiatric:        Mood and Affect: Mood is not anxious or depressed.        Speech: Speech normal.        Behavior: Behavior normal. Behavior is cooperative.        Thought Content: Thought content normal.        Judgment: Judgment normal.      Results for orders placed or performed in visit on 06/12/21  POCT glycosylated hemoglobin (Hb A1C)  Result Value Ref Range   Hemoglobin A1C 7.8 (A) 4.0 - 5.6 %   HbA1c POC (<> result, manual entry)     HbA1c, POC (prediabetic range)     HbA1c, POC (controlled diabetic range)    HM DIABETES FOOT EXAM  Result Value Ref Range   HM Diabetic Foot Exam done     This visit occurred during the SARS-CoV-2 public health emergency.  Safety protocols were in place, including screening questions prior to the visit, additional usage of staff PPE, and extensive cleaning of exam room while observing appropriate contact time as indicated for disinfecting solutions.   COVID 19 screen:  No recent travel or known exposure to COVID19 The patient denies respiratory symptoms of COVID 19 at this time. The importance of social distancing was discussed today.   Assessment and Plan    Problem List Items Addressed This Visit     Hyperlipidemia associated with type 2 diabetes mellitus ([place])    She is not interested in rechecking  today given the recent holidays... she will continue low chol diet and we will recheck in 3 months.      Relevant Medications   SEMGLEE, YFGN, 100 UNIT/ML Pen   Statin intolerance   Uncontrolled type 2 diabetes mellitus with hyperglycemia ([place]) - Primary  Chronic, improving control.  Encouraged exercise, weight loss, healthy eating habits.  Re-eval on same meds in 3 months.      Relevant Medications   SEMGLEE, YFGN, 100 UNIT/ML Pen   Other Relevant Orders   POCT glycosylated hemoglobin (Hb A1C) (Completed)     Eliezer Lofts, MD

## 2021-10-09 NOTE — Assessment & Plan Note (Signed)
Chronic, improving control.  Encouraged exercise, weight loss, healthy eating habits.  Re-eval on same meds in 3 months.

## 2021-10-09 NOTE — Assessment & Plan Note (Signed)
She is not interested in rechecking today given the recent holidays... she will continue low chol diet and we will recheck in 3 months.

## 2021-10-09 NOTE — Patient Instructions (Signed)
Keep working on healthy low carb eating and increase exercise.

## 2021-10-15 DIAGNOSIS — C50911 Malignant neoplasm of unspecified site of right female breast: Secondary | ICD-10-CM | POA: Diagnosis not present

## 2021-10-15 DIAGNOSIS — C50912 Malignant neoplasm of unspecified site of left female breast: Secondary | ICD-10-CM | POA: Diagnosis not present

## 2021-10-26 DIAGNOSIS — E854 Organ-limited amyloidosis: Secondary | ICD-10-CM | POA: Diagnosis not present

## 2021-10-26 DIAGNOSIS — R002 Palpitations: Secondary | ICD-10-CM | POA: Diagnosis not present

## 2021-10-26 DIAGNOSIS — L99 Other disorders of skin and subcutaneous tissue in diseases classified elsewhere: Secondary | ICD-10-CM | POA: Diagnosis not present

## 2022-01-08 ENCOUNTER — Telehealth: Payer: Self-pay | Admitting: *Deleted

## 2022-01-08 ENCOUNTER — Ambulatory Visit: Payer: BC Managed Care – PPO | Admitting: Family Medicine

## 2022-01-08 NOTE — Telephone Encounter (Signed)
Received on (12/12/21) via of fax DME Standard Written Order from [facility] [place].  Requesting signature and return.  Given to provider to sign.   ? ?DME Standard Written Order signed and faxed back to [facility] [place].  Confirmation received and copy scanned into the chart.//AB/CMA ?

## 2022-01-15 ENCOUNTER — Encounter: Payer: Self-pay | Admitting: Family Medicine

## 2022-01-15 ENCOUNTER — Ambulatory Visit (INDEPENDENT_AMBULATORY_CARE_PROVIDER_SITE_OTHER): Payer: BC Managed Care – PPO | Admitting: Family Medicine

## 2022-01-15 VITALS — BP 122/78 | HR 98 | Ht 63.75 in | Wt 136.8 lb

## 2022-01-15 DIAGNOSIS — R0609 Other forms of dyspnea: Secondary | ICD-10-CM | POA: Insufficient documentation

## 2022-01-15 DIAGNOSIS — E1165 Type 2 diabetes mellitus with hyperglycemia: Secondary | ICD-10-CM

## 2022-01-15 LAB — POCT GLYCOSYLATED HEMOGLOBIN (HGB A1C): Hemoglobin A1C: 8 % — AB (ref 4.0–5.6)

## 2022-01-15 MED ORDER — CONTINUOUS GLUCOSE MONITOR SUP KIT
PACK | 0 refills | Status: DC
Start: 2022-01-15 — End: 2022-02-05

## 2022-01-15 NOTE — Assessment & Plan Note (Signed)
New.. start supplement to replete. ?

## 2022-01-15 NOTE — Assessment & Plan Note (Signed)
New, ongoing 6-8 month. ? Las 3 months ago in nml range. ? No S/S of infection. ? no symptoms of reflux.Marland Kitchen description of throat tightness may be associated with panic and anxiety. ? Given DOE component .Marland Kitchen EKG done today... no abnormalities. ?  If persists.Marland Kitchen we will eval further with possible ECHO and CXR to make sure no involvement with other co morbidities such as lupus, amyloidosis, breast cancer history ( no radiation history). 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 ?

## 2022-01-15 NOTE — Assessment & Plan Note (Signed)
Chronic, poor control ? ? inadequate control despite semglee and metfomrin 100 mg BID( intermediate acting). Reported blood sugars do not clearly correspond with A1C. ? I am hesitant to increase insulin given concern for hypoglycemia in AMs. ? We will have her follow more closely with Continuous glucose monitor. ?She will increase metformin to 1000 mg TID unless increase in side effects. Work on lifestyle changes and decrease stress. ?Re-eval in 3 months. ?  ?

## 2022-01-15 NOTE — Patient Instructions (Addendum)
Set up yearly eye exam for diabetes and have the opthalmologist send [place] a copy of the evaluation for the chart. ? Increase the dose of metformin.. by taking  1000 mg three times daily... if GI upset call. ? Continue Semglee 18 Units daily. ? Don't skip meals, follow blood sugars. ? Start continuous glucose monitor. ? Start magnesium supplement over the counter. ?

## 2022-01-15 NOTE — Progress Notes (Signed)
? ? Patient ID: Miranda Greene, female    DOB: 1955-10-18, 66 y.o.   MRN: [id number] ? ?This visit was conducted in person. ? ?BP 122/78   Pulse 98   Ht 5' 3.75" (1.619 m)   Wt 136 lb 12.8 oz (62.1 kg)   SpO2 100%   BMI 23.67 kg/m?   ? ?CC:  ?Chief Complaint  ?Patient presents with  ? Follow-up  ?  Follow up , for  diabetes, having some shortness of breathiness at times  notice this over the last couple months on and off ,   ? ? ?Subjective:  ? ?HPI: ?Miranda Greene is a 66 y.o. female presenting on 01/15/2022 for Follow-up (Follow up , for  diabetes, having some shortness of breathiness at times  notice this over the last couple months on and off , ) ? ? ?Diabetes:  Worsening diabetes control despite lifestyle changes... she reports a lot of stress in last 3 months. ? On metformin 1000 mg BID and Semglee 18 units at bedtime ?Lab Results  ?Component Value Date  ? HGBA1C 8.0 (A) 01/15/2022  ?Using medications without difficulties: none, compliant.. not on ER given cost. ?Hypoglycemic episodes: none, one 67 ?Hyperglycemic episodes: none ?Feet problems: no ulcers ?Blood Sugars averaging: FBS 85-140, 2 hours after dinner 180.. usually checking 1-2 times per day. ?eye exam within last year: due  ? ?Wt Readings from Last 3 Encounters:  ?01/15/22 136 lb 12.8 oz (62.1 kg)  ?10/09/21 133 lb 2 oz (60.4 kg)  ?06/12/21 132 lb 6 oz (60 kg)  ? ? She has noted intermittent shortness of breath in last 6-8 months. ( Has not mentioned to any MD ? Noting with climbing steps. Occ at rest feel like she cannot get her breath. Feels like throat is closing up at night. No heartburn on omeprazole. ? O2 Sat 97%, HR 85-92 ? No cough, wheeze, no infection. ? Increase in fatigue lately. ?No chest pain, no chest tightness. ? ? Hx of breast cancer ( no chem , no radiation) and amlyodosis ?   ?Reviewed 10/26/2021 cbc Hg 13. TSH  1.21 ?Mg was low.. despite ? ?Relevant past medical, surgical, family and social history reviewed and updated as  indicated. Interim medical history since our last visit reviewed. ?Allergies and medications reviewed and updated. ?Outpatient Medications Prior to Visit  ?Medication Sig Dispense Refill  ? ibuprofen (ADVIL,MOTRIN) 200 MG tablet Take 200 mg by mouth every 6 (six) hours as needed for moderate pain.     ? metFORMIN (GLUCOPHAGE) 1000 MG tablet TAKE 1 TABLET BY MOUTH TWICE DAILY WITH A MEAL FOR DIABETES 180 tablet 2  ? Multiple Vitamins-Minerals (CENTRUM SILVER 50+WOMEN) TABS Take 1 tablet by mouth daily.    ? omeprazole (PRILOSEC) 20 MG capsule Take 20 mg by mouth daily.    ? Probiotic Product (PROBIOTIC PO) Take 1 tablet by mouth daily. [place]ure Made    ? SEMGLEE, YFGN, 100 UNIT/ML Pen Inject 18 Units into the skin at bedtime. 15 mL 1  ? ?No facility-administered medications prior to visit.  ?  ? ?Per HPI unless specifically indicated in ROS section below ?Review of Systems  ?Constitutional:  Negative for fatigue and fever.  ?HENT:  Negative for congestion.   ?Eyes:  Negative for pain.  ?Respiratory:  Negative for cough and shortness of breath.   ?Cardiovascular:  Negative for chest pain, palpitations and leg swelling.  ?Gastrointestinal:  Negative for abdominal pain.  ?Genitourinary:  Negative for dysuria and  vaginal bleeding.  ?Musculoskeletal:  Negative for back pain.  ?Neurological:  Negative for syncope, light-headedness and headaches.  ?Psychiatric/Behavioral:  Negative for dysphoric mood.   ?Objective:  ?BP 122/78   Pulse 98   Ht 5' 3.75" (1.619 m)   Wt 136 lb 12.8 oz (62.1 kg)   SpO2 100%   BMI 23.67 kg/m?   ?Wt Readings from Last 3 Encounters:  ?01/15/22 136 lb 12.8 oz (62.1 kg)  ?10/09/21 133 lb 2 oz (60.4 kg)  ?06/12/21 132 lb 6 oz (60 kg)  ?  ?  ?Physical Exam ?Constitutional:   ?   General: She is not in acute distress. ?   Appearance: Normal appearance. She is well-developed. She is not ill-appearing or toxic-appearing.  ?HENT:  ?   Head: Normocephalic.  ?   Right Ear: Hearing, tympanic membrane, ear  canal and external ear normal. Tympanic membrane is not erythematous, retracted or bulging.  ?   Left Ear: Hearing, tympanic membrane, ear canal and external ear normal. Tympanic membrane is not erythematous, retracted or bulging.  ?   Nose: No mucosal edema or rhinorrhea.  ?   Right Sinus: No maxillary sinus tenderness or frontal sinus tenderness.  ?   Left Sinus: No maxillary sinus tenderness or frontal sinus tenderness.  ?   Mouth/Throat:  ?   Pharynx: Uvula midline.  ?Eyes:  ?   General: Lids are normal. Lids are everted, no foreign bodies appreciated.  ?   Conjunctiva/sclera: Conjunctivae normal.  ?   Pupils: Pupils are equal, round, and reactive to light.  ?Neck:  ?   Thyroid: No thyroid mass or thyromegaly.  ?   Vascular: No carotid bruit.  ?   Trachea: Trachea normal.  ?Cardiovascular:  ?   Rate and Rhythm: Normal rate and regular rhythm.  ?   Pulses: Normal pulses.  ?   Heart sounds: Normal heart sounds, S1 normal and S2 normal. No murmur heard. ?  No friction rub. No gallop.  ?Pulmonary:  ?   Effort: Pulmonary effort is normal. No tachypnea or respiratory distress.  ?   Breath sounds: Normal breath sounds. No decreased breath sounds, wheezing, rhonchi or rales.  ?Abdominal:  ?   General: Bowel sounds are normal.  ?   Palpations: Abdomen is soft.  ?   Tenderness: There is no abdominal tenderness.  ?Musculoskeletal:  ?   Cervical back: Normal range of motion and neck supple.  ?Skin: ?   General: Skin is warm and dry.  ?   Findings: No rash.  ?Neurological:  ?   Mental Status: She is alert.  ?Psychiatric:     ?   Mood and Affect: Mood is not anxious or depressed.     ?   Speech: Speech normal.     ?   Behavior: Behavior normal. Behavior is cooperative.     ?   Thought Content: Thought content normal.     ?   Judgment: Judgment normal.  ? ?   ?Results for orders placed or performed in visit on 01/15/22  ?POCT glycosylated hemoglobin (Hb A1C)  ?Result Value Ref Range  ? Hemoglobin A1C 8.0 (A) 4.0 - 5.6 %  ?  HbA1c POC (<> result, manual entry)    ? HbA1c, POC (prediabetic range)    ? HbA1c, POC (controlled diabetic range)    ? ? ? ?COVID 19 screen:  No recent travel or known exposure to [place] ?The patient denies respiratory symptoms of COVID 19 at this  time. ?The importance of social distancing was discussed today.  ? ?Assessment and Plan ? ? EKG: normal EKG, normal sinus rhythm, unchanged from previous tracings. ? ?Problem List Items Addressed This Visit   ? ? DOE (dyspnea on exertion)  ?   New, ongoing 6-8 month. ? Las 3 months ago in nml range. ? No S/S of infection. ? no symptoms of reflux.Marland Kitchen description of throat tightness may be associated with panic and anxiety. ? Given DOE component .Marland Kitchen EKG done today... no abnormalities. ?  If persists.Marland Kitchen we will eval further with possible ECHO and CXR to make sure no involvement with other co morbidities such as lupus, amyloidosis, breast cancer history ( no radiation history). 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 ? ?  ?  ? Relevant Orders  ? EKG 12-Lead (Completed)  ? Hypomagnesemia  ? Uncontrolled type 2 diabetes mellitus with hyperglycemia (HCC) - Primary  ?   Chronic, poor control ? ? inadequate control despite semglee and metfomrin 100 mg BID( intermediate acting). Reported blood sugars do not clearly correspond with A1C. ? I am hesitant to increase insulin given concern for hypoglycemia in AMs. ? We will have her follow more closely with Continuous glucose monitor. ?She will increase metformin to 1000 mg TID unless increase in side effects. Work on lifestyle changes and decrease stress. ?Re-eval in 3 months. ?  ? ?  ?  ? Relevant Orders  ? POCT glycosylated hemoglobin (Hb A1C) (Completed)  ? ?Meds ordered this encounter  ?Medications  ? Continuous Glucose Monitor Sup KIT  ?  Sig: Check blood sugar continuously given  poor control and occasional low blood sugars on insulin. ? Dx E11.65  ?  Dispense:  1  kit  ?  Refill:  0  ? ?Orders Placed This Encounter  ?Procedures  ? POCT glycosylated hemoglobin (Hb A1C)  ?  Associate with Z13.1  ? EKG 12-Lead  ? ? ? ?Eliezer Lofts, MD  ? ?

## 2022-02-05 ENCOUNTER — Telehealth: Payer: Self-pay | Admitting: Family Medicine

## 2022-02-05 NOTE — Telephone Encounter (Addendum)
Spoke with Stanton Kidney at [employer].  She was asking what continuous glucose monitor we were wanting patient to have.  She states her insurance will cover the [employer].  Instructed to dispense the [facility] sensor and reader.  Medication list updated.

## 2022-02-05 NOTE — Telephone Encounter (Signed)
Mary from [facility] needs to speak with the nurse about this pt, she needs clarification. Please return a call back when possible, thanks.  Callback Number: [phone]

## 2022-02-05 NOTE — Addendum Note (Signed)
Addended by: Carter Kitten on: 02/05/2022 10:20 AM   Modules accepted: Orders

## 2022-04-02 DIAGNOSIS — E119 Type 2 diabetes mellitus without complications: Secondary | ICD-10-CM | POA: Diagnosis not present

## 2022-04-02 DIAGNOSIS — H52223 Regular astigmatism, bilateral: Secondary | ICD-10-CM | POA: Diagnosis not present

## 2022-04-02 DIAGNOSIS — H524 Presbyopia: Secondary | ICD-10-CM | POA: Diagnosis not present

## 2022-04-02 DIAGNOSIS — H5213 Myopia, bilateral: Secondary | ICD-10-CM | POA: Diagnosis not present

## 2022-04-02 LAB — HM DIABETES EYE EXAM

## 2022-04-03 ENCOUNTER — Encounter: Payer: Self-pay | Admitting: Family Medicine

## 2022-04-03 MED ORDER — SEMGLEE (YFGN) 100 UNIT/ML ~~LOC~~ SOPN
18.0000 [IU] | PEN_INJECTOR | Freq: Every day | SUBCUTANEOUS | 0 refills | Status: DC
Start: 2022-04-03 — End: 2022-06-25

## 2022-04-16 ENCOUNTER — Ambulatory Visit (INDEPENDENT_AMBULATORY_CARE_PROVIDER_SITE_OTHER): Payer: BC Managed Care – PPO | Admitting: Family Medicine

## 2022-04-16 ENCOUNTER — Encounter: Payer: Self-pay | Admitting: Family Medicine

## 2022-04-16 VITALS — BP 130/70 | HR 83 | Temp 97.8°F | Ht 63.75 in | Wt 133.0 lb

## 2022-04-16 DIAGNOSIS — E1165 Type 2 diabetes mellitus with hyperglycemia: Secondary | ICD-10-CM

## 2022-04-16 DIAGNOSIS — E1169 Type 2 diabetes mellitus with other specified complication: Secondary | ICD-10-CM

## 2022-04-16 DIAGNOSIS — E119 Type 2 diabetes mellitus without complications: Secondary | ICD-10-CM | POA: Diagnosis not present

## 2022-04-16 DIAGNOSIS — R0609 Other forms of dyspnea: Secondary | ICD-10-CM

## 2022-04-16 DIAGNOSIS — E785 Hyperlipidemia, unspecified: Secondary | ICD-10-CM

## 2022-04-16 DIAGNOSIS — Z789 Other specified health status: Secondary | ICD-10-CM

## 2022-04-16 LAB — POCT GLYCOSYLATED HEMOGLOBIN (HGB A1C): Hemoglobin A1C: 7 % — AB (ref 4.0–5.6)

## 2022-04-16 LAB — MICROALBUMIN / CREATININE URINE RATIO
Creatinine,U: 129.3 mg/dL
Microalb Creat Ratio: 0.7 mg/g (ref 0.0–30.0)
Microalb, Ur: 0.9 mg/dL (ref 0.0–1.9)

## 2022-04-16 MED ORDER — METFORMIN HCL 850 MG PO TABS
ORAL_TABLET | ORAL | 1 refills | Status: DC
Start: 2022-04-16 — End: 2022-11-21

## 2022-04-16 NOTE — Assessment & Plan Note (Signed)
Chronic, improved control. Her metformin dose is slightly above maximum.  We will change this prescription to 850 mg 3 times daily for a max of 2550 mg daily of short acting metformin.  If her insurance does not cover the 850 mg size tablet we can have her do 1000 mg in the morning and evening and half a tablet midday. She will continue working on healthy lifestyle changes, regular exercise and low-carb diet.

## 2022-04-16 NOTE — Patient Instructions (Signed)
Change metformin to 850 mg three times daily.  Continue Semglee.  Continue working on healthy eating and regular exercise.

## 2022-04-16 NOTE — Assessment & Plan Note (Signed)
Given past intolerance she continues to refuse statin medication or other treatment of cholesterol at this time.

## 2022-04-16 NOTE — Assessment & Plan Note (Signed)
Minimal symptoms at this time.  May have been related to stress or deconditioning.  She is not interested in any further workup.

## 2022-04-16 NOTE — Progress Notes (Signed)
Patient ID: Miranda Greene, female    DOB: May 03, 1956, 65 y.o.   MRN: [id number]  This visit was conducted in person.  BP 130/70   Pulse 83   Temp 97.8 F (36.6 C) (Oral)   Ht 5' 3.75" (1.619 m)   Wt 133 lb (60.3 kg)   SpO2 98%   BMI 23.01 kg/m    CC:  Chief Complaint  Patient presents with   Diabetes    Subjective:   HPI: Miranda Greene is a 66 y.o. female presenting on 04/16/2022 for Diabetes  Diabetes Significant improvement in A1C down from 8 in 01/2022. At that time she was given a prescription for a continuous blood sugar monitor to follow CBGs more closely..   On metformin 1000 mg TID ( this was increased from BID 3 months ago, But noteable we had down she was taking 500 mg, so she is now on too much metformin) and Semglee 18 units at bedtime.  She denies any SE at higher dose.   Lab Results  Component Value Date   HGBA1C 7.0 (A) 04/16/2022  Using medications without difficulties: Hypoglycemic episodes:  Once Hyperglycemic episodes: none Feet problems: none Blood Sugars averaging: FBS  93-105 eye exam within last year: yes  BP Readings from Last 3 Encounters:  04/16/22 130/70  01/15/22 122/78  10/09/21 110/74    At last OV: DOE. NO sign of infection, EKG unremarkable, possible symptoms of anxiety, no symptoms of reflux. Today she reports  improvement in symptoms.Marland Kitchen only rarely having SOB/DOE.  No chest pain.       Relevant past medical, surgical, family and social history reviewed and updated as indicated. Interim medical history since our last visit reviewed. Allergies and medications reviewed and updated. Outpatient Medications Prior to Visit  Medication Sig Dispense Refill   ibuprofen (ADVIL,MOTRIN) 200 MG tablet Take 200 mg by mouth every 6 (six) hours as needed for moderate pain.      metFORMIN (GLUCOPHAGE) 500 MG tablet Take 500 mg by mouth in the morning, at noon, and at bedtime.     Multiple Vitamins-Minerals (CENTRUM SILVER 50+WOMEN) TABS  Take 1 tablet by mouth daily.     omeprazole (PRILOSEC) 20 MG capsule Take 20 mg by mouth daily.     Probiotic Product (PROBIOTIC PO) Take 1 tablet by mouth daily. [place] Made     SEMGLEE, YFGN, 100 UNIT/ML Pen Inject 18 Units into the skin at bedtime. 15 mL 0   metFORMIN (GLUCOPHAGE) 1000 MG tablet TAKE 1 TABLET BY MOUTH TWICE DAILY WITH A MEAL FOR DIABETES (Patient taking differently: Take 500 mg by mouth in the morning, at noon, and at bedtime. TAKE 1 TABLET BY MOUTH TWICE DAILY WITH A MEAL FOR DIABETES) 180 tablet 2   Continuous Blood Gluc Receiver (FREESTYLE LIBRE 14 DAY READER) DEVI by Does not apply route.     Continuous Blood Gluc Sensor (FREESTYLE LIBRE 14 DAY SENSOR) MISC by Does not apply route.     No facility-administered medications prior to visit.     Per HPI unless specifically indicated in ROS section below Review of Systems  Constitutional:  Negative for fatigue and fever.  HENT:  Negative for congestion.   Eyes:  Negative for pain.  Respiratory:  Negative for cough and shortness of breath.   Cardiovascular:  Negative for chest pain, palpitations and leg swelling.  Gastrointestinal:  Negative for abdominal pain.  Genitourinary:  Negative for dysuria and vaginal bleeding.  Musculoskeletal:  Negative for  back pain.  Neurological:  Negative for syncope, light-headedness and headaches.  Psychiatric/Behavioral:  Negative for dysphoric mood.    Objective:  BP 130/70   Pulse 83   Temp 97.8 F (36.6 C) (Oral)   Ht 5' 3.75" (1.619 m)   Wt 133 lb (60.3 kg)   SpO2 98%   BMI 23.01 kg/m   Wt Readings from Last 3 Encounters:  04/16/22 133 lb (60.3 kg)  01/15/22 136 lb 12.8 oz (62.1 kg)  10/09/21 133 lb 2 oz (60.4 kg)      Physical Exam Constitutional:      General: She is not in acute distress.    Appearance: Normal appearance. She is well-developed. She is not ill-appearing or toxic-appearing.  HENT:     Head: Normocephalic.     Right Ear: Hearing, tympanic  membrane, ear canal and external ear normal. Tympanic membrane is not erythematous, retracted or bulging.     Left Ear: Hearing, tympanic membrane, ear canal and external ear normal. Tympanic membrane is not erythematous, retracted or bulging.     Nose: No mucosal edema or rhinorrhea.     Right Sinus: No maxillary sinus tenderness or frontal sinus tenderness.     Left Sinus: No maxillary sinus tenderness or frontal sinus tenderness.     Mouth/Throat:     Pharynx: Uvula midline.  Eyes:     General: Lids are normal. Lids are everted, no foreign bodies appreciated.     Conjunctiva/sclera: Conjunctivae normal.     Pupils: Pupils are equal, round, and reactive to light.  Neck:     Thyroid: No thyroid mass or thyromegaly.     Vascular: No carotid bruit.     Trachea: Trachea normal.  Cardiovascular:     Rate and Rhythm: Normal rate and regular rhythm.     Pulses: Normal pulses.     Heart sounds: Normal heart sounds, S1 normal and S2 normal. No murmur heard.    No friction rub. No gallop.  Pulmonary:     Effort: Pulmonary effort is normal. No tachypnea or respiratory distress.     Breath sounds: Normal breath sounds. No decreased breath sounds, wheezing, rhonchi or rales.  Abdominal:     General: Bowel sounds are normal.     Palpations: Abdomen is soft.     Tenderness: There is no abdominal tenderness.  Musculoskeletal:     Cervical back: Normal range of motion and neck supple.  Skin:    General: Skin is warm and dry.     Findings: No rash.  Neurological:     Mental Status: She is alert.  Psychiatric:        Mood and Affect: Mood is not anxious or depressed.        Speech: Speech normal.        Behavior: Behavior normal. Behavior is cooperative.        Thought Content: Thought content normal.        Judgment: Judgment normal.       Results for orders placed or performed in visit on 04/16/22  POCT glycosylated hemoglobin (Hb A1C)  Result Value Ref Range   Hemoglobin A1C 7.0 (A)  4.0 - 5.6 %   HbA1c POC (<> result, manual entry)     HbA1c, POC (prediabetic range)     HbA1c, POC (controlled diabetic range)       COVID 19 screen:  No recent travel or known exposure to COVID19 The patient denies respiratory symptoms of COVID 19 at this  time. The importance of social distancing was discussed today.   Assessment and Plan    Problem List Items Addressed This Visit     DOE (dyspnea on exertion)    Minimal symptoms at this time.  May have been related to stress or deconditioning.  She is not interested in any further workup.      Hyperlipidemia associated with type 2 diabetes mellitus (HCC)   Relevant Medications   metFORMIN (GLUCOPHAGE) 850 MG tablet   Statin intolerance    Given past intolerance she continues to refuse statin medication or other treatment of cholesterol at this time.      Uncontrolled type 2 diabetes mellitus with hyperglycemia (HCC) - Primary    Chronic, improved control. Her metformin dose is slightly above maximum.  We will change this prescription to 850 mg 3 times daily for a max of 2550 mg daily of short acting metformin.  If her insurance does not cover the 850 mg size tablet we can have her do 1000 mg in the morning and evening and half a tablet midday. She will continue working on healthy lifestyle changes, regular exercise and low-carb diet.      Relevant Medications   metFORMIN (GLUCOPHAGE) 850 MG tablet   Other Relevant Orders   POCT glycosylated hemoglobin (Hb A1C) (Completed)   Other Visit Diagnoses     Type 2 diabetes mellitus without complication, without long-term current use of insulin (HCC)       Relevant Medications   metFORMIN (GLUCOPHAGE) 850 MG tablet      Orders Placed This Encounter  Procedures   Microalbumin / creatinine urine ratio    Standing Status:   Future    Standing Expiration Date:   04/16/2023   POCT glycosylated hemoglobin (Hb A1C)   Meds ordered this encounter  Medications   metFORMIN  (GLUCOPHAGE) 850 MG tablet    Sig: TAKE 1 TABLET BY MOUTH THREE TIMES DAILY WITH A MEAL FOR DIABETES    Dispense:  270 tablet    Refill:  1      Eliezer Lofts, MD

## 2022-05-07 ENCOUNTER — Other Ambulatory Visit: Payer: Self-pay | Admitting: Family Medicine

## 2022-05-07 DIAGNOSIS — E119 Type 2 diabetes mellitus without complications: Secondary | ICD-10-CM

## 2022-05-28 ENCOUNTER — Ambulatory Visit: Payer: BC Managed Care – PPO | Admitting: Plastic Surgery

## 2022-06-25 ENCOUNTER — Other Ambulatory Visit: Payer: Self-pay | Admitting: Family Medicine

## 2022-06-26 MED ORDER — SEMGLEE (YFGN) 100 UNIT/ML ~~LOC~~ SOPN
18.0000 [IU] | PEN_INJECTOR | Freq: Every day | SUBCUTANEOUS | 0 refills | Status: DC
Start: 2022-06-26 — End: 2022-09-15

## 2022-06-27 ENCOUNTER — Telehealth: Payer: Self-pay | Admitting: Family Medicine

## 2022-06-27 DIAGNOSIS — E1165 Type 2 diabetes mellitus with hyperglycemia: Secondary | ICD-10-CM

## 2022-06-27 NOTE — Telephone Encounter (Signed)
-----   Message from Ellamae Sia sent at 06/27/2022 10:19 AM EDT ----- Regarding: Lab orders for Friday, 11.3.23 Patient is scheduled for CPX labs, please order future labs, Thanks , Karna Christmas

## 2022-07-12 ENCOUNTER — Other Ambulatory Visit (INDEPENDENT_AMBULATORY_CARE_PROVIDER_SITE_OTHER): Payer: BC Managed Care – PPO

## 2022-07-12 DIAGNOSIS — E1165 Type 2 diabetes mellitus with hyperglycemia: Secondary | ICD-10-CM

## 2022-07-12 LAB — COMPREHENSIVE METABOLIC PANEL
ALT: 11 U/L (ref 0–35)
AST: 16 U/L (ref 0–37)
Albumin: 4.4 g/dL (ref 3.5–5.2)
Alkaline Phosphatase: 61 U/L (ref 39–117)
BUN: 17 mg/dL (ref 6–23)
CO2: 31 mEq/L (ref 19–32)
Calcium: 9.6 mg/dL (ref 8.4–10.5)
Chloride: 101 mEq/L (ref 96–112)
Creatinine, Ser: 0.8 mg/dL (ref 0.40–1.20)
GFR: 77.78 mL/min (ref >60.00)
Glucose, Bld: 84 mg/dL (ref 70–99)
Potassium: 4.1 mEq/L (ref 3.5–5.1)
Sodium: 138 mEq/L (ref 135–145)
Total Bilirubin: 0.5 mg/dL (ref 0.2–1.2)
Total Protein: 7.2 g/dL (ref 6.0–8.3)

## 2022-07-12 LAB — MICROALBUMIN / CREATININE URINE RATIO
Creatinine,U: 140.9 mg/dL
Microalb Creat Ratio: 0.5 mg/g (ref 0.0–30.0)
Microalb, Ur: 0.7 mg/dL (ref 0.0–1.9)

## 2022-07-12 LAB — LIPID PANEL
Cholesterol: 180 mg/dL (ref 0–200)
HDL: 37.6 mg/dL — ABNORMAL LOW (ref >39.00)
LDL Cholesterol: 114 mg/dL — ABNORMAL HIGH (ref 0–99)
NonHDL: 142.66
Total CHOL/HDL Ratio: 5
Triglycerides: 144 mg/dL (ref 0.0–149.0)
VLDL: 28.8 mg/dL (ref 0.0–40.0)

## 2022-07-12 LAB — HEMOGLOBIN A1C: Hgb A1c MFr Bld: 7.3 % — ABNORMAL HIGH (ref 4.6–6.5)

## 2022-07-12 NOTE — Progress Notes (Signed)
No critical labs need to be addressed urgently. We will discuss labs in detail at upcoming office visit.   

## 2022-07-19 ENCOUNTER — Ambulatory Visit (INDEPENDENT_AMBULATORY_CARE_PROVIDER_SITE_OTHER): Payer: BC Managed Care – PPO | Admitting: Family Medicine

## 2022-07-19 ENCOUNTER — Encounter: Payer: Self-pay | Admitting: Family Medicine

## 2022-07-19 VITALS — BP 120/80 | HR 88 | Temp 98.1°F | Ht 63.75 in | Wt 132.1 lb

## 2022-07-19 DIAGNOSIS — Z0000 Encounter for general adult medical examination without abnormal findings: Secondary | ICD-10-CM

## 2022-07-19 DIAGNOSIS — E1165 Type 2 diabetes mellitus with hyperglycemia: Secondary | ICD-10-CM

## 2022-07-19 DIAGNOSIS — E1169 Type 2 diabetes mellitus with other specified complication: Secondary | ICD-10-CM

## 2022-07-19 DIAGNOSIS — T466X5A Adverse effect of antihyperlipidemic and antiarteriosclerotic drugs, initial encounter: Secondary | ICD-10-CM

## 2022-07-19 DIAGNOSIS — M329 Systemic lupus erythematosus, unspecified: Secondary | ICD-10-CM

## 2022-07-19 DIAGNOSIS — M791 Myalgia, unspecified site: Secondary | ICD-10-CM

## 2022-07-19 DIAGNOSIS — E785 Hyperlipidemia, unspecified: Secondary | ICD-10-CM

## 2022-07-19 DIAGNOSIS — Z789 Other specified health status: Secondary | ICD-10-CM

## 2022-07-19 DIAGNOSIS — L99 Other disorders of skin and subcutaneous tissue in diseases classified elsewhere: Secondary | ICD-10-CM

## 2022-07-19 DIAGNOSIS — E854 Organ-limited amyloidosis: Secondary | ICD-10-CM

## 2022-07-19 LAB — HM DIABETES FOOT EXAM

## 2022-07-19 NOTE — Assessment & Plan Note (Signed)
Stable, chronic.   Almost at goal on current metformin dose.Continue current medication.  Encouraged exercise, weight loss, healthy eating habits.

## 2022-07-19 NOTE — Assessment & Plan Note (Signed)
Not currently treated. Not interested in referral back to rheum at this time.

## 2022-07-19 NOTE — Progress Notes (Signed)
Patient ID: Miranda Greene, female    DOB: 1955-10-07, 66 y.o.   MRN: [id number]  This visit was conducted in person.  BP 120/80   Pulse 88   Temp 98.1 F (36.7 C) (Oral)   Ht 5' 3.75" (1.619 m)   Wt 132 lb 2 oz (59.9 kg)   SpO2 96%   BMI 22.86 kg/m    CC:  Chief Complaint  Patient presents with   Annual Exam    Subjective:   HPI: Miranda Greene is a 66 y.o. female presenting on 07/19/2022 for Annual Exam  The patient presents for  complete physical and review of chronic health problems. He/She also has the following acute concerns today: none  Mother had an MI yesterday and in in the hospital.   Reviewed last OV note in detail.  Elevated Cholesterol: SE to statin x 2  in past.. myalgia Lab Results  Component Value Date   CHOL 180 07/12/2022   HDL 37.60 (L) 07/12/2022   LDLCALC 114 (H) 07/12/2022   TRIG 144.0 07/12/2022   CHOLHDL 5 07/12/2022  Using medications without problems: Muscle aches:  Diet compliance: low carb Exercise: walking daily Other complaints:  Wt Readings from Last 3 Encounters:  07/19/22 132 lb 2 oz (59.9 kg)  04/16/22 133 lb (60.3 kg)  01/15/22 136 lb 12.8 oz (62.1 kg)     Diabetes:  Moderate control on metformin 850 mg  3 times daily Lab Results  Component Value Date   HGBA1C 7.3 (H) 07/12/2022  Using medications without difficulties: none Hypoglycemic episodes:  rare .. 67 after late eating lunch Hyperglycemic episodes: occ Feet problems: none Blood Sugars averaging: FBS 87-108 eye exam within last year: yes  Lupus     Relevant past medical, surgical, family and social history reviewed and updated as indicated. Interim medical history since our last visit reviewed. Allergies and medications reviewed and updated. Outpatient Medications Prior to Visit  Medication Sig Dispense Refill   ibuprofen (ADVIL,MOTRIN) 200 MG tablet Take 200 mg by mouth every 6 (six) hours as needed for moderate pain.      metFORMIN (GLUCOPHAGE)  850 MG tablet TAKE 1 TABLET BY MOUTH THREE TIMES DAILY WITH A MEAL FOR DIABETES 270 tablet 1   Multiple Vitamins-Minerals (CENTRUM SILVER 50+WOMEN) TABS Take 1 tablet by mouth daily.     omeprazole (PRILOSEC) 20 MG capsule Take 20 mg by mouth daily.     Probiotic Product (PROBIOTIC PO) Take 1 tablet by mouth daily. Nauture Made     SEMGLEE, YFGN, 100 UNIT/ML Pen Inject 18 Units into the skin at bedtime. 15 mL 0   No facility-administered medications prior to visit.     Per HPI unless specifically indicated in ROS section below Review of Systems  Constitutional:  Negative for fatigue and fever.  HENT:  Negative for congestion.   Eyes:  Negative for pain.  Respiratory:  Negative for cough and shortness of breath.   Cardiovascular:  Negative for chest pain, palpitations and leg swelling.  Gastrointestinal:  Negative for abdominal pain.  Genitourinary:  Negative for dysuria and vaginal bleeding.  Musculoskeletal:  Negative for back pain.  Neurological:  Negative for syncope, light-headedness and headaches.  Psychiatric/Behavioral:  Negative for dysphoric mood.    Objective:  BP 120/80   Pulse 88   Temp 98.1 F (36.7 C) (Oral)   Ht 5' 3.75" (1.619 m)   Wt 132 lb 2 oz (59.9 kg)   SpO2 96%   BMI  22.86 kg/m   Wt Readings from Last 3 Encounters:  07/19/22 132 lb 2 oz (59.9 kg)  04/16/22 133 lb (60.3 kg)  01/15/22 136 lb 12.8 oz (62.1 kg)      Physical Exam Vitals and nursing note reviewed.  Constitutional:      General: She is not in acute distress.    Appearance: Normal appearance. She is well-developed. She is not ill-appearing or toxic-appearing.  HENT:     Head: Normocephalic.     Right Ear: Hearing, tympanic membrane, ear canal and external ear normal.     Left Ear: Hearing, tympanic membrane, ear canal and external ear normal.     Nose: Nose normal.  Eyes:     General: Lids are normal. Lids are everted, no foreign bodies appreciated.     Conjunctiva/sclera: Conjunctivae  normal.     Pupils: Pupils are equal, round, and reactive to light.  Neck:     Thyroid: No thyroid mass or thyromegaly.     Vascular: No carotid bruit.     Trachea: Trachea normal.  Cardiovascular:     Rate and Rhythm: Normal rate and regular rhythm.     Heart sounds: Normal heart sounds, S1 normal and S2 normal. No murmur heard.    No gallop.  Pulmonary:     Effort: Pulmonary effort is normal. No respiratory distress.     Breath sounds: Normal breath sounds. No wheezing, rhonchi or rales.  Abdominal:     General: Bowel sounds are normal. There is no distension or abdominal bruit.     Palpations: Abdomen is soft. There is no fluid wave or mass.     Tenderness: There is no abdominal tenderness. There is no guarding or rebound.     Hernia: No hernia is present.  Musculoskeletal:     Cervical back: Normal range of motion and neck supple.  Lymphadenopathy:     Cervical: No cervical adenopathy.  Skin:    General: Skin is warm and dry.     Findings: No rash.  Neurological:     Mental Status: She is alert.     Cranial Nerves: No cranial nerve deficit.     Sensory: No sensory deficit.  Psychiatric:        Mood and Affect: Mood is not anxious or depressed.        Speech: Speech normal.        Behavior: Behavior normal. Behavior is cooperative.        Judgment: Judgment normal.      Diabetic foot exam: Normal inspection No skin breakdown No calluses  Normal DP pulses Normal sensation to light touch and monofilament Nails normal     Results for orders placed or performed in visit on 07/12/22  Microalbumin / creatinine urine ratio  Result Value Ref Range   Microalb, Ur 0.7 0.0 - 1.9 mg/dL   Creatinine,U 140.9 mg/dL   Microalb Creat Ratio 0.5 0.0 - 30.0 mg/g  Comprehensive metabolic panel  Result Value Ref Range   Sodium 138 135 - 145 mEq/L   Potassium 4.1 3.5 - 5.1 mEq/L   Chloride 101 96 - 112 mEq/L   CO2 31 19 - 32 mEq/L   Glucose, Bld 84 70 - 99 mg/dL   BUN 17 6 - 23  mg/dL   Creatinine, Ser 0.80 0.40 - 1.20 mg/dL   Total Bilirubin 0.5 0.2 - 1.2 mg/dL   Alkaline Phosphatase 61 39 - 117 U/L   AST 16 0 - 37 U/L  ALT 11 0 - 35 U/L   Total Protein 7.2 6.0 - 8.3 g/dL   Albumin 4.4 3.5 - 5.2 g/dL   GFR 77.78 >60.00 mL/min   Calcium 9.6 8.4 - 10.5 mg/dL  Lipid panel  Result Value Ref Range   Cholesterol 180 0 - 200 mg/dL   Triglycerides 144.0 0.0 - 149.0 mg/dL   HDL 37.60 (L) >39.00 mg/dL   VLDL 28.8 0.0 - 40.0 mg/dL   LDL Cholesterol 114 (H) 0 - 99 mg/dL   Total CHOL/HDL Ratio 5    NonHDL 142.66   Hemoglobin A1c  Result Value Ref Range   Hgb A1c MFr Bld 7.3 (H) 4.6 - 6.5 %     COVID 19 screen:  No recent travel or known exposure to COVID19 The patient denies respiratory symptoms of COVID 19 at this time. The importance of social distancing was discussed today.   Assessment and Plan   The patient's preventative maintenance and recommended screening tests for an annual wellness exam were reviewed in full today. Brought up to date unless services declined.  Counselled on the importance of diet, exercise, and its role in overall health and mortality. The patient's FH and SH was reviewed, including their home life, tobacco status, and drug and alcohol status.   Vaccines: Due for shingrix, COVID vaccine x 3 ( considering  x 4) Uptodate with PNA and td. Refuses flu. Pap/DVE:  partial hysterectomy Mammo:  S/P total mastectomy, history of breast cancer, followed by Dr. Ninfa Linden Bone Density:  not interested today.. 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 Intolerant of statins and not interested in retrial or alternate medications for cholesterol at this time.  The 10-year ASCVD risk score  (Arnett DK, et al., 2019) is: 9.4%   Values used to calculate the score:     Age: 24 years     Sex: Female     Is Non-Hispanic African American: No     Diabetic: Yes     Tobacco smoker: No     Systolic Blood Pressure: 858 mmHg     Is BP treated: No     HDL Cholesterol: 37.6 mg/dL     Total Cholesterol: 180 mg/dL   Did discuss mother new MI/ CAD history increase her risk.       Lupus ([place])    Not currently treated. Not interested in referral back to rheum at this time.      Statin intolerance   Uncontrolled type 2 diabetes mellitus with hyperglycemia (HCC)    Stable, chronic.   Almost at goal on current metformin dose.Continue current medication.  Encouraged exercise, weight loss, healthy eating habits.           Other Visit Diagnoses     Routine general medical examination at a health care facility    -  Primary   Myalgia due to statin           Eliezer Lofts, MD

## 2022-07-19 NOTE — Assessment & Plan Note (Signed)
Stable

## 2022-07-19 NOTE — Patient Instructions (Addendum)
Encourage exercise, weight loss, healthy eating habits.

## 2022-07-19 NOTE — Assessment & Plan Note (Signed)
Chronic, poor control. Intolerant of statins and not interested in retrial or alternate medications for cholesterol at this time.  The 10-year ASCVD risk score (Arnett DK, et al., 2019) is: 9.4%   Values used to calculate the score:     Age: 66 years     Sex: Female     Is Non-Hispanic African American: No     Diabetic: Yes     Tobacco smoker: No     Systolic Blood Pressure: 856 mmHg     Is BP treated: No     HDL Cholesterol: 37.6 mg/dL     Total Cholesterol: 180 mg/dL   Did discuss mother new MI/ CAD history increase her risk.

## 2022-08-26 DIAGNOSIS — Z86000 Personal history of in-situ neoplasm of breast: Secondary | ICD-10-CM | POA: Diagnosis not present

## 2022-08-27 ENCOUNTER — Encounter: Payer: Self-pay | Admitting: Family Medicine

## 2022-08-27 MED ORDER — VALACYCLOVIR HCL 1 G PO TABS: 2000.0000 mg | ORAL_TABLET | Freq: Two times a day (BID) | ORAL | 4 refills | Status: AC

## 2022-09-12 ENCOUNTER — Other Ambulatory Visit: Payer: Self-pay | Admitting: Family Medicine

## 2022-11-19 DIAGNOSIS — L99 Other disorders of skin and subcutaneous tissue in diseases classified elsewhere: Secondary | ICD-10-CM | POA: Diagnosis not present

## 2022-11-19 DIAGNOSIS — E854 Organ-limited amyloidosis: Secondary | ICD-10-CM | POA: Diagnosis not present

## 2022-11-21 ENCOUNTER — Other Ambulatory Visit: Payer: Self-pay | Admitting: Family Medicine

## 2022-11-21 DIAGNOSIS — E119 Type 2 diabetes mellitus without complications: Secondary | ICD-10-CM

## 2022-12-25 ENCOUNTER — Telehealth: Payer: Self-pay | Admitting: *Deleted

## 2022-12-25 DIAGNOSIS — E1165 Type 2 diabetes mellitus with hyperglycemia: Secondary | ICD-10-CM

## 2022-12-25 DIAGNOSIS — E1169 Type 2 diabetes mellitus with other specified complication: Secondary | ICD-10-CM

## 2022-12-25 NOTE — Telephone Encounter (Signed)
-----   Message from Alvina Chou sent at 12/25/2022  2:22 PM EDT ----- Regarding: Lab orders for Wednesday, 5.8.24 Patient is scheduled for CPX labs, please order future labs, Thanks , Camelia Eng

## 2022-12-26 NOTE — Addendum Note (Signed)
Addended byKerby Nora E on: 12/26/2022 01:17 PM   Modules accepted: Orders

## 2023-01-15 ENCOUNTER — Other Ambulatory Visit (INDEPENDENT_AMBULATORY_CARE_PROVIDER_SITE_OTHER): Payer: BC Managed Care – PPO

## 2023-01-15 DIAGNOSIS — E785 Hyperlipidemia, unspecified: Secondary | ICD-10-CM

## 2023-01-15 DIAGNOSIS — E1165 Type 2 diabetes mellitus with hyperglycemia: Secondary | ICD-10-CM

## 2023-01-15 DIAGNOSIS — E1169 Type 2 diabetes mellitus with other specified complication: Secondary | ICD-10-CM

## 2023-01-15 LAB — LIPID PANEL
Cholesterol: 191 mg/dL (ref 0–200)
HDL: 39.8 mg/dL (ref >39.00)
LDL Cholesterol: 127 mg/dL — ABNORMAL HIGH (ref 0–99)
NonHDL: 151.62
Total CHOL/HDL Ratio: 5
Triglycerides: 121 mg/dL (ref 0.0–149.0)
VLDL: 24.2 mg/dL (ref 0.0–40.0)

## 2023-01-15 LAB — COMPREHENSIVE METABOLIC PANEL
ALT: 13 U/L (ref 0–35)
AST: 18 U/L (ref 0–37)
Albumin: 4.3 g/dL (ref 3.5–5.2)
Alkaline Phosphatase: 65 U/L (ref 39–117)
BUN: 15 mg/dL (ref 6–23)
CO2: 28 mEq/L (ref 19–32)
Calcium: 9.5 mg/dL (ref 8.4–10.5)
Chloride: 101 mEq/L (ref 96–112)
Creatinine, Ser: 0.95 mg/dL (ref 0.40–1.20)
GFR: 63.06 mL/min (ref >60.00)
Glucose, Bld: 93 mg/dL (ref 70–99)
Potassium: 4.7 mEq/L (ref 3.5–5.1)
Sodium: 136 mEq/L (ref 135–145)
Total Bilirubin: 0.6 mg/dL (ref 0.2–1.2)
Total Protein: 7.2 g/dL (ref 6.0–8.3)

## 2023-01-15 LAB — HEMOGLOBIN A1C: Hgb A1c MFr Bld: 7.5 % — ABNORMAL HIGH (ref 4.6–6.5)

## 2023-01-15 LAB — MAGNESIUM: Magnesium: 1.8 mg/dL (ref 1.5–2.5)

## 2023-01-15 NOTE — Progress Notes (Signed)
No critical labs need to be addressed urgently. We will discuss labs in detail at upcoming office visit.   

## 2023-01-21 ENCOUNTER — Ambulatory Visit (INDEPENDENT_AMBULATORY_CARE_PROVIDER_SITE_OTHER): Payer: BC Managed Care – PPO | Admitting: Family Medicine

## 2023-01-21 ENCOUNTER — Encounter: Payer: Self-pay | Admitting: Family Medicine

## 2023-01-21 VITALS — BP 140/80 | HR 84 | Temp 97.2°F | Ht 63.75 in | Wt 133.5 lb

## 2023-01-21 DIAGNOSIS — Z789 Other specified health status: Secondary | ICD-10-CM

## 2023-01-21 DIAGNOSIS — E785 Hyperlipidemia, unspecified: Secondary | ICD-10-CM

## 2023-01-21 DIAGNOSIS — E1169 Type 2 diabetes mellitus with other specified complication: Secondary | ICD-10-CM | POA: Diagnosis not present

## 2023-01-21 DIAGNOSIS — E1165 Type 2 diabetes mellitus with hyperglycemia: Secondary | ICD-10-CM

## 2023-01-21 NOTE — Assessment & Plan Note (Signed)
Chronic, poor control. Intolerant of statins and not interested in retrial or alternate medications for cholesterol at this time.  The 10-year ASCVD risk score (Arnett DK, et al., 2019) is: 13.8%   Values used to calculate the score:     Age: 67 years     Sex: Female     Is Non-Hispanic African American: No     Diabetic: Yes     Tobacco smoker: No     Systolic Blood Pressure: 140 mmHg     Is BP treated: No     HDL Cholesterol: 39.8 mg/dL     Total Cholesterol: 191 mg/dL   Did discuss mother new MI/ CAD history increase her risk.

## 2023-01-21 NOTE — Assessment & Plan Note (Addendum)
Worsening control, chronic.    Moderate control on metformin 850 mg  3 times daily  ( at max dose) SE to invokana.. cannot tolerate SGlt2i... could try ozemoic family if not at goal in next 3 months. She wishes to work on lifestyle changes and stress reduction.  Reevaluate in 3 months.  Will

## 2023-01-21 NOTE — Patient Instructions (Addendum)
Work on low [employer], increase exercise. Work on stress reduction.

## 2023-01-21 NOTE — Progress Notes (Signed)
Patient ID: Miranda Greene, female    DOB: 1956/02/23, 67 y.o.   MRN: [id number]  This visit was conducted in person.  BP (!) 140/80 (BP Location: Left Arm, Patient Position: Sitting, Cuff Size: Normal)   Pulse 84   Temp (!) 97.2 F (36.2 C) (Temporal)   Ht 5' 3.75" (1.619 m)   Wt 133 lb 8 oz (60.6 kg)   SpO2 99%   BMI 23.10 kg/m    CC:  Chief Complaint  Patient presents with   Diabetes    Subjective:   HPI: Miranda Greene is a 67 y.o. female presenting on 01/21/2023 for Diabetes   Her mother passed away. 6 months ago, working overtime,  Elevated Cholesterol: SE to statin x 2  in past.. myalgia Lab Results  Component Value Date   CHOL 191 01/15/2023   HDL 39.80 01/15/2023   LDLCALC 127 (H) 01/15/2023   TRIG 121.0 01/15/2023   CHOLHDL 5 01/15/2023  Using medications without problems: Muscle aches:  Diet compliance: low carb Exercise: walking daily Other complaints:  Wt Readings from Last 3 Encounters:  01/21/23 133 lb 8 oz (60.6 kg)  07/19/22 132 lb 2 oz (59.9 kg)  04/16/22 133 lb (60.3 kg)     Diabetes:  Moderate control on metformin 850 mg  3 times daily ( at max dose)  SE to invokana.. cannot tolerate SGlt2i... could try ozemopic family. Lab Results  Component Value Date   HGBA1C 7.5 (H) 01/15/2023  Using medications without difficulties:  daily diarrhea.. tolerable. Hypoglycemic episodes:  rare .. 67 after late eating lunch Hyperglycemic episodes: occ Feet problems: none Blood Sugars averaging: FBS 87-108 eye exam within last year: yes    Relevant past medical, surgical, family and social history reviewed and updated as indicated. Interim medical history since our last visit reviewed. Allergies and medications reviewed and updated. Outpatient Medications Prior to Visit  Medication Sig Dispense Refill   ibuprofen (ADVIL,MOTRIN) 200 MG tablet Take 200 mg by mouth every 6 (six) hours as needed for moderate pain.      metFORMIN (GLUCOPHAGE) 850 MG  tablet TAKE 1 TABLET BY MOUTH THREE TIMES DAILY WITH A MEAL FOR DIABETES 270 tablet 0   Multiple Vitamins-Minerals (CENTRUM SILVER 50+WOMEN) TABS Take 1 tablet by mouth daily.     omeprazole (PRILOSEC) 20 MG capsule Take 20 mg by mouth daily.     Probiotic Product (PROBIOTIC PO) Take 1 tablet by mouth daily. Nauture Made     SEMGLEE, YFGN, 100 UNIT/ML Pen INJECT 18 UNITS INTO THE SKIN  AT BEDTIME 15 mL 4   No facility-administered medications prior to visit.     Per HPI unless specifically indicated in ROS section below Review of Systems  Constitutional:  Negative for fatigue and fever.  HENT:  Negative for congestion.   Eyes:  Negative for pain.  Respiratory:  Negative for cough and shortness of breath.   Cardiovascular:  Negative for chest pain, palpitations and leg swelling.  Gastrointestinal:  Negative for abdominal pain.  Genitourinary:  Negative for dysuria and vaginal bleeding.  Musculoskeletal:  Negative for back pain.  Neurological:  Negative for syncope, light-headedness and headaches.  Psychiatric/Behavioral:  Negative for dysphoric mood.    Objective:  BP (!) 140/80 (BP Location: Left Arm, Patient Position: Sitting, Cuff Size: Normal)   Pulse 84   Temp (!) 97.2 F (36.2 C) (Temporal)   Ht 5' 3.75" (1.619 m)   Wt 133 lb 8 oz (60.6 kg)  SpO2 99%   BMI 23.10 kg/m   Wt Readings from Last 3 Encounters:  01/21/23 133 lb 8 oz (60.6 kg)  07/19/22 132 lb 2 oz (59.9 kg)  04/16/22 133 lb (60.3 kg)      Physical Exam Vitals and nursing note reviewed.  Constitutional:      General: She is not in acute distress.    Appearance: Normal appearance. She is well-developed. She is not ill-appearing or toxic-appearing.  HENT:     Head: Normocephalic.     Right Ear: Hearing, tympanic membrane, ear canal and external ear normal.     Left Ear: Hearing, tympanic membrane, ear canal and external ear normal.     Nose: Nose normal.  Eyes:     General: Lids are normal. Lids are  everted, no foreign bodies appreciated.     Conjunctiva/sclera: Conjunctivae normal.     Pupils: Pupils are equal, round, and reactive to light.  Neck:     Thyroid: No thyroid mass or thyromegaly.     Vascular: No carotid bruit.     Trachea: Trachea normal.  Cardiovascular:     Rate and Rhythm: Normal rate and regular rhythm.     Heart sounds: Normal heart sounds, S1 normal and S2 normal. No murmur heard.    No gallop.  Pulmonary:     Effort: Pulmonary effort is normal. No respiratory distress.     Breath sounds: Normal breath sounds. No wheezing, rhonchi or rales.  Abdominal:     General: Bowel sounds are normal. There is no distension or abdominal bruit.     Palpations: Abdomen is soft. There is no fluid wave or mass.     Tenderness: There is no abdominal tenderness. There is no guarding or rebound.     Hernia: No hernia is present.  Musculoskeletal:     Cervical back: Normal range of motion and neck supple.  Lymphadenopathy:     Cervical: No cervical adenopathy.  Skin:    General: Skin is warm and dry.     Findings: No rash.  Neurological:     Mental Status: She is alert.     Cranial Nerves: No cranial nerve deficit.     Sensory: No sensory deficit.  Psychiatric:        Mood and Affect: Mood is not anxious or depressed.        Speech: Speech normal.        Behavior: Behavior normal. Behavior is cooperative.        Judgment: Judgment normal.      Diabetic foot exam: Normal inspection No skin breakdown No calluses  Normal DP pulses Normal sensation to light touch and monofilament Nails normal     Results for orders placed or performed in visit on 01/15/23  Magnesium  Result Value Ref Range   Magnesium 1.8 1.5 - 2.5 mg/dL  Lipid panel  Result Value Ref Range   Cholesterol 191 0 - 200 mg/dL   Triglycerides 161.0 0.0 - 149.0 mg/dL   HDL 96.04 >54.09 mg/dL   VLDL 81.1 0.0 - 91.4 mg/dL   LDL Cholesterol 782 (H) 0 - 99 mg/dL   Total CHOL/HDL Ratio 5    NonHDL  151.62   Hemoglobin A1c  Result Value Ref Range   Hgb A1c MFr Bld 7.5 (H) 4.6 - 6.5 %  Comprehensive metabolic panel  Result Value Ref Range   Sodium 136 135 - 145 mEq/L   Potassium 4.7 3.5 - 5.1 mEq/L   Chloride 101  96 - 112 mEq/L   CO2 28 19 - 32 mEq/L   Glucose, Bld 93 70 - 99 mg/dL   BUN 15 6 - 23 mg/dL   Creatinine, Ser 1.61 0.40 - 1.20 mg/dL   Total Bilirubin 0.6 0.2 - 1.2 mg/dL   Alkaline Phosphatase 65 39 - 117 U/L   AST 18 0 - 37 U/L   ALT 13 0 - 35 U/L   Total Protein 7.2 6.0 - 8.3 g/dL   Albumin 4.3 3.5 - 5.2 g/dL   GFR 09.60 >45.40 mL/min   Calcium 9.5 8.4 - 10.5 mg/dL     COVID 19 screen:  No recent travel or known exposure to COVID19 The patient denies respiratory symptoms of COVID 19 at this time. The importance of social distancing was discussed today.   Assessment and Plan   The patient's preventative maintenance and recommended screening tests for an annual wellness exam were reviewed in full today. Brought up to date unless services declined.  Counselled on the importance of diet, exercise, and its role in overall health and mortality. The patient's FH and SH was reviewed, including their home life, tobacco status, and drug and alcohol status.   Vaccines: Due for shingrix, COVID vaccine x 3 ( considering  x 4) Uptodate with PNA and td. Refuses flu. Pap/DVE:  partial hysterectomy Mammo:  S/P total mastectomy, history of breast cancer, followed by Dr. Magnus Ivan Bone Density:  not interested today.. will discuss in 2024 Colon: 09/11/20, repeat 10 years Smoking Status:none ETOH/ drug JWJ:[id number]  Hep C:  negative  Problem List Items Addressed This Visit     Hyperlipidemia associated with type 2 diabetes mellitus (HCC)     Chronic, poor control. Intolerant of statins and not interested in retrial or alternate medications for cholesterol at this time.  The 10-year ASCVD risk score (Arnett DK, et al., 2019) is: 13.8%   Values used to calculate the  score:     Age: 82 years     Sex: Female     Is Non-Hispanic African American: No     Diabetic: Yes     Tobacco smoker: No     Systolic Blood Pressure: 140 mmHg     Is BP treated: No     HDL Cholesterol: 39.8 mg/dL     Total Cholesterol: 191 mg/dL   Did discuss mother new MI/ CAD history increase her risk.       Hypomagnesemia   Statin intolerance   Uncontrolled type 2 diabetes mellitus with hyperglycemia (HCC) - Primary    Worsening control, chronic.    Moderate control on metformin 850 mg  3 times daily  ( at max dose) SE to invokana.. cannot tolerate SGlt2i... could try ozemoic family if not at goal in next 3 months. She wishes to work on lifestyle changes and stress reduction.  Reevaluate in 3 months.  Will         Kerby Nora, MD

## 2023-02-25 ENCOUNTER — Other Ambulatory Visit: Payer: Self-pay | Admitting: Family Medicine

## 2023-02-25 DIAGNOSIS — E119 Type 2 diabetes mellitus without complications: Secondary | ICD-10-CM

## 2023-02-25 MED ORDER — METFORMIN HCL 850 MG PO TABS
850.0000 mg | ORAL_TABLET | Freq: Three times a day (TID) | ORAL | 1 refills | Status: DC
Start: 2023-02-25 — End: 2023-10-28

## 2023-03-31 ENCOUNTER — Telehealth: Payer: Self-pay | Admitting: *Deleted

## 2023-03-31 DIAGNOSIS — E1169 Type 2 diabetes mellitus with other specified complication: Secondary | ICD-10-CM

## 2023-03-31 DIAGNOSIS — E1165 Type 2 diabetes mellitus with hyperglycemia: Secondary | ICD-10-CM

## 2023-03-31 NOTE — Telephone Encounter (Signed)
Future lab orders

## 2023-04-05 ENCOUNTER — Emergency Department (HOSPITAL_COMMUNITY): Payer: BC Managed Care – PPO

## 2023-04-05 ENCOUNTER — Encounter (HOSPITAL_COMMUNITY): Payer: Self-pay | Admitting: Emergency Medicine

## 2023-04-05 ENCOUNTER — Ambulatory Visit (HOSPITAL_COMMUNITY)
Admission: EM | Admit: 2023-04-05 | Discharge: 2023-04-05 | Disposition: A | Discharge status: Home / Self Care | Payer: BC Managed Care – PPO | Attending: Internal Medicine | Admitting: Internal Medicine

## 2023-04-05 ENCOUNTER — Emergency Department (HOSPITAL_COMMUNITY)
Admission: EM | Admit: 2023-04-05 | Discharge: 2023-04-05 | Disposition: A | Discharge status: Home / Self Care | Payer: BC Managed Care – PPO | Attending: Emergency Medicine | Admitting: Emergency Medicine

## 2023-04-05 ENCOUNTER — Other Ambulatory Visit: Payer: Self-pay

## 2023-04-05 ENCOUNTER — Encounter (HOSPITAL_COMMUNITY): Payer: Self-pay

## 2023-04-05 DIAGNOSIS — Z7984 Long term (current) use of oral hypoglycemic drugs: Secondary | ICD-10-CM | POA: Insufficient documentation

## 2023-04-05 DIAGNOSIS — K7689 Other specified diseases of liver: Secondary | ICD-10-CM | POA: Diagnosis not present

## 2023-04-05 DIAGNOSIS — R1011 Right upper quadrant pain: Secondary | ICD-10-CM | POA: Diagnosis not present

## 2023-04-05 DIAGNOSIS — N390 Urinary tract infection, site not specified: Secondary | ICD-10-CM | POA: Diagnosis not present

## 2023-04-05 DIAGNOSIS — R1084 Generalized abdominal pain: Secondary | ICD-10-CM

## 2023-04-05 DIAGNOSIS — R1031 Right lower quadrant pain: Secondary | ICD-10-CM | POA: Diagnosis not present

## 2023-04-05 DIAGNOSIS — E119 Type 2 diabetes mellitus without complications: Secondary | ICD-10-CM | POA: Insufficient documentation

## 2023-04-05 DIAGNOSIS — K8689 Other specified diseases of pancreas: Secondary | ICD-10-CM | POA: Diagnosis not present

## 2023-04-05 DIAGNOSIS — R9431 Abnormal electrocardiogram [ECG] [EKG]: Secondary | ICD-10-CM | POA: Diagnosis not present

## 2023-04-05 DIAGNOSIS — K828 Other specified diseases of gallbladder: Secondary | ICD-10-CM | POA: Diagnosis not present

## 2023-04-05 DIAGNOSIS — K760 Fatty (change of) liver, not elsewhere classified: Secondary | ICD-10-CM | POA: Diagnosis not present

## 2023-04-05 LAB — COMPREHENSIVE METABOLIC PANEL
ALT: 14 U/L (ref 0–44)
AST: 19 U/L (ref 15–41)
Albumin: 3.8 g/dL (ref 3.5–5.0)
Alkaline Phosphatase: 61 U/L (ref 38–126)
Anion gap: 12 (ref 5–15)
BUN: 10 mg/dL (ref 8–23)
CO2: 22 mmol/L (ref 22–32)
Calcium: 9.1 mg/dL (ref 8.9–10.3)
Chloride: 99 mmol/L (ref 98–111)
Creatinine, Ser: 0.96 mg/dL (ref 0.44–1.00)
GFR, Estimated: >60 mL/min (ref >60)
Glucose, Bld: 193 mg/dL — ABNORMAL HIGH (ref 70–99)
Potassium: 4.5 mmol/L (ref 3.5–5.1)
Sodium: 133 mmol/L — ABNORMAL LOW (ref 135–145)
Total Bilirubin: 0.2 mg/dL — ABNORMAL LOW (ref 0.3–1.2)
Total Protein: 6.8 g/dL (ref 6.5–8.1)

## 2023-04-05 LAB — URINALYSIS, ROUTINE W REFLEX MICROSCOPIC
Bilirubin Urine: NEGATIVE
Glucose, UA: 50 mg/dL — AB
Hgb urine dipstick: NEGATIVE
Ketones, ur: NEGATIVE mg/dL
Nitrite: NEGATIVE
Protein, ur: NEGATIVE mg/dL
Specific Gravity, Urine: 1.012 (ref 1.005–1.030)
WBC, UA: >50 WBC/hpf (ref 0–5)
pH: 5 (ref 5.0–8.0)

## 2023-04-05 LAB — CBC
HCT: 36 % (ref 36.0–46.0)
Hemoglobin: 12 g/dL (ref 12.0–15.0)
MCH: 30.1 pg (ref 26.0–34.0)
MCHC: 33.3 g/dL (ref 30.0–36.0)
MCV: 90.2 fL (ref 80.0–100.0)
Platelets: 309 10*3/uL (ref 150–400)
RBC: 3.99 MIL/uL (ref 3.87–5.11)
RDW: 12.8 % (ref 11.5–15.5)
WBC: 6.6 10*3/uL (ref 4.0–10.5)
nRBC: 0 % (ref 0.0–0.2)

## 2023-04-05 LAB — POCT URINALYSIS DIP (MANUAL ENTRY)
Bilirubin, UA: NEGATIVE
Glucose, UA: 250 mg/dL — AB
Ketones, POC UA: NEGATIVE mg/dL
Nitrite, UA: NEGATIVE
Protein Ur, POC: NEGATIVE mg/dL
Spec Grav, UA: 1.015 (ref 1.010–1.025)
Urobilinogen, UA: 0.2 E.U./dL
pH, UA: 6 (ref 5.0–8.0)

## 2023-04-05 LAB — TROPONIN I (HIGH SENSITIVITY)
Troponin I (High Sensitivity): 3 ng/L (ref <18)
Troponin I (High Sensitivity): 3 ng/L (ref <18)

## 2023-04-05 LAB — LIPASE, BLOOD: Lipase: 31 U/L (ref 11–51)

## 2023-04-05 LAB — CBG MONITORING, ED: Glucose-Capillary: 164 mg/dL — ABNORMAL HIGH (ref 70–99)

## 2023-04-05 MED ORDER — SODIUM CHLORIDE 0.9 % IV SOLN
1.0000 g | Freq: Once | INTRAVENOUS | Status: AC
  Administered 2023-04-05: 1 g via INTRAVENOUS
  Filled 2023-04-05: qty 10

## 2023-04-05 MED ORDER — IOHEXOL 350 MG/ML SOLN
75.0000 mL | Freq: Once | INTRAVENOUS | Status: AC | PRN
  Administered 2023-04-05: 75 mL via INTRAVENOUS

## 2023-04-05 MED ORDER — LACTATED RINGERS IV BOLUS
1000.0000 mL | Freq: Once | INTRAVENOUS | Status: AC
  Administered 2023-04-05: 1000 mL via INTRAVENOUS

## 2023-04-05 MED ORDER — OXYCODONE HCL 5 MG PO TABS: 5.0000 mg | ORAL_TABLET | Freq: Four times a day (QID) | ORAL | 0 refills | Status: DC | PRN

## 2023-04-05 MED ORDER — CEFDINIR 300 MG PO CAPS: 300.0000 mg | ORAL_CAPSULE | Freq: Two times a day (BID) | ORAL | 0 refills | Status: AC

## 2023-04-05 MED ORDER — ALUM & MAG HYDROXIDE-SIMETH 200-200-20 MG/5ML PO SUSP
30.0000 mL | Freq: Once | ORAL | Status: AC
  Administered 2023-04-05: 30 mL via ORAL
  Filled 2023-04-05: qty 30

## 2023-04-05 MED ORDER — ONDANSETRON HCL 4 MG PO TABS: 4.0000 mg | ORAL_TABLET | ORAL | 0 refills | Status: DC | PRN

## 2023-04-05 MED ORDER — KETOROLAC TROMETHAMINE 15 MG/ML IJ SOLN
15.0000 mg | Freq: Once | INTRAMUSCULAR | Status: AC
  Administered 2023-04-05: 15 mg via INTRAVENOUS
  Filled 2023-04-05: qty 1

## 2023-04-05 NOTE — ED Triage Notes (Signed)
Patient here today with c/o right side flank pain that radiates from her back and around to her right rib area and down to her lower abd X 10 days. Patient noticed some frequent urination as well. IBU helps some.

## 2023-04-05 NOTE — ED Notes (Signed)
Pt was hooked up back to IV after returning from restroom  Pt stated that the site was burning  No infiltration noted IV was discontinued and new IV started Pt's fluid hooked up to new IV with no pain

## 2023-04-05 NOTE — ED Provider Notes (Signed)
[place] EMERGENCY DEPARTMENT AT [facility] Provider Note   CSN: [id number] Arrival date & time: 04/05/23  1113     History  Chief Complaint  Patient presents with   Abdominal Pain   Flank Pain    Miranda Greene is a 67 y.o. female.   Abdominal Pain Flank Pain Associated symptoms include abdominal pain.  67 year old female history of diabetes, GERD, hyperlipidemia presenting for abdominal pain.  Patient states for about the last week she has had consistent right upper quadrant pain.  Occasionally radiates to her right flank or right lower quadrant.  It is sometimes worse with eating, she had some nausea but no vomiting.  No fevers or chills.  No urinary symptoms.  She has not had any chest pain or difficulty breathing.  No pleuritic pain.  She has not had any falls or trauma.  No symptoms similar to this before.     Home Medications Prior to Admission medications   Medication Sig Start Date End Date Taking? Authorizing Provider  ibuprofen (ADVIL,MOTRIN) 200 MG tablet Take 200 mg by mouth every 6 (six) hours as needed for moderate pain.     [provider]  metFORMIN (GLUCOPHAGE) 850 MG tablet Take 1 tablet (850 mg total) by mouth with breakfast, with lunch, and with evening meal. 02/25/23   Bedsole, Amy E, MD  Multiple Vitamins-Minerals (CENTRUM SILVER 50+WOMEN) TABS Take 1 tablet by mouth daily.    [provider]  omeprazole (PRILOSEC) 20 MG capsule Take 20 mg by mouth daily.    [provider]  Probiotic Product (PROBIOTIC PO) Take 1 tablet by mouth daily. Nauture Made    [provider]  SEMGLEE, YFGN, 100 UNIT/ML Pen INJECT 18 UNITS INTO THE SKIN  AT BEDTIME 09/15/22   Bedsole, Amy E, MD      Allergies    Meloxicam and Invokana [canagliflozin]    Review of Systems   Review of Systems  Gastrointestinal:  Positive for abdominal pain.  Genitourinary:  Positive for flank pain.  Review of systems completed and notable as per  HPI.  ROS otherwise negative.   Physical Exam Updated Vital Signs BP (!) 163/86   Pulse 78   Temp 97.8 F (36.6 C) (Oral)   Resp 12   Ht 5\' 3"  (1.6 m)   Wt 59.4 kg   SpO2 97%   BMI 23.21 kg/m  Physical Exam Vitals and nursing note reviewed.  Constitutional:      General: She is not in acute distress.    Appearance: She is well-developed.  HENT:     Head: Normocephalic and atraumatic.  Eyes:     Conjunctiva/sclera: Conjunctivae normal.  Cardiovascular:     Rate and Rhythm: Normal rate and regular rhythm.     Heart sounds: No murmur heard. Pulmonary:     Effort: Pulmonary effort is normal. No respiratory distress.     Breath sounds: Normal breath sounds.  Abdominal:     Palpations: Abdomen is soft.     Tenderness: There is abdominal tenderness in the right upper quadrant and right lower quadrant. There is no right CVA tenderness, left CVA tenderness, guarding or rebound.  Musculoskeletal:        General: No swelling.     Cervical back: Neck supple.  Skin:    General: Skin is warm and dry.     Capillary Refill: Capillary refill takes less than 2 seconds.  Neurological:     Mental Status: She is alert.  Psychiatric:        Mood and Affect: Mood normal.     ED Results / Procedures / Treatments   Labs (all labs ordered are listed, but only abnormal results are displayed) Labs Reviewed  COMPREHENSIVE METABOLIC PANEL - Abnormal; Notable for the following components:      Result Value   Sodium 133 (*)    Glucose, Bld 193 (*)    Total Bilirubin 0.2 (*)    All other components within normal limits  URINALYSIS, ROUTINE W REFLEX MICROSCOPIC - Abnormal; Notable for the following components:   APPearance HAZY (*)    Glucose, UA 50 (*)    Leukocytes,Ua LARGE (*)    Bacteria, UA FEW (*)    All other components within normal limits  URINE CULTURE  LIPASE, BLOOD  CBC  TROPONIN I (HIGH SENSITIVITY)  TROPONIN I (HIGH SENSITIVITY)    EKG EKG  Interpretation Date/Time:  Saturday April 05 2023 12:21:04 EDT Ventricular Rate:  79 PR Interval:  149 QRS Duration:  98 QT Interval:  400 QTC Calculation: 459 R Axis:   20  Text Interpretation: Sinus rhythm Probable left ventricular hypertrophy Confirmed by Fulton Reek [phone]) on 04/05/2023 12:29:57 PM  Radiology US Abdomen Limited RUQ (LIVER/GB)  Result Date: 04/05/2023 CLINICAL DATA:  Right upper quadrant pain EXAM: ULTRASOUND ABDOMEN LIMITED RIGHT UPPER QUADRANT COMPARISON:  None Available. FINDINGS: Gallbladder: No gallstones or wall thickening visualized. No sonographic Murphy sign noted by sonographer. Gallbladder is distended. Common bile duct: Diameter: 4 mm Liver: Anechoic structure seen peripherally in the right hepatic lobe measuring 2 cm. Simple cysts. Mildly echogenic hepatic parenchyma consistent with fatty liver infiltration. Portal vein is patent on color Doppler imaging with normal direction of blood flow towards the liver. Other: None. IMPRESSION: Distended gallbladder. No shadowing stones or ductal dilatation. Mild fatty liver infiltration. Simple right hepatic lobe cyst.  No specific imaging follow-up Electronically Signed   By: Karen Kays M.D.   On: 04/05/2023 15:16   CT ABDOMEN PELVIS W CONTRAST  Result Date: 04/05/2023 CLINICAL DATA:  Right upper quadrant pain and right flank pain EXAM: CT ABDOMEN AND PELVIS WITH CONTRAST TECHNIQUE: Multidetector CT imaging of the abdomen and pelvis was performed using the standard protocol following bolus administration of intravenous contrast. RADIATION DOSE REDUCTION: This exam was performed according to the departmental dose-optimization program which includes automated exposure control, adjustment of the mA and/or kV according to patient size and/or use of iterative reconstruction technique. CONTRAST:  75mL OMNIPAQUE IOHEXOL 350 MG/ML SOLN COMPARISON:  None Available. FINDINGS: Lower chest: Mild linear opacity left lung base likely  scar or atelectasis. No pleural effusion. Hepatobiliary: Benign-appearing hepatic cystic lesion seen measuring 2 cm posteriorly in the right hepatic lobe on series 3, image 26. No specific imaging follow-up. No aggressive features. Global fatty liver infiltration identified. Patent portal vein. Gallbladder is nondilated. Pancreas: Diffuse fatty atrophy of the pancreas.  No obvious mass. Spleen: Normal in size without focal abnormality. Small splenule anteriorly. Adrenals/Urinary Tract: Adrenal glands are unremarkable. Kidneys are normal, without renal calculi, focal lesion, or hydronephrosis. Bladder is unremarkable. Stomach/Bowel: No oral contrast. Large bowel is of normal course and caliber with moderate colonic stool. Appendix is poorly seen in the right lower quadrant but there is no pericecal stranding or fluid. Stomach and small bowel are nondilated. Vascular/Lymphatic: Aortic atherosclerosis. No enlarged abdominal or pelvic lymph nodes. Reproductive: Status post hysterectomy. No adnexal masses. Other: No free air or free fluid. Musculoskeletal: No acute or significant osseous  findings. IMPRESSION: No bowel obstruction, free air or free fluid.  Scattered stool. Fatty liver infiltration. Electronically Signed   By: Karen Kays M.D.   On: 04/05/2023 13:33    Procedures Procedures    Medications Ordered in ED Medications  cefTRIAXone (ROCEPHIN) 1 g in sodium chloride 0.9 % 100 mL IVPB (has no administration in time range)  ketorolac (TORADOL) 15 MG/ML injection 15 mg (has no administration in time range)  alum & mag hydroxide-simeth (MAALOX/MYLANTA) 200-200-20 MG/5ML suspension 30 mL (has no administration in time range)  lactated ringers bolus 1,000 mL (1,000 mLs Intravenous New Bag/Given 04/05/23 1253)  iohexol (OMNIPAQUE) 350 MG/ML injection 75 mL (75 mLs Intravenous Contrast Given 04/05/23 1255)    ED Course/ Medical Decision Making/ A&P                             Medical Decision  Making Amount and/or Complexity of Data Reviewed Labs: ordered. Radiology: ordered.  Risk OTC drugs. Prescription drug management.   Medical Decision Making:   Dajah Bazzle is a 67 y.o. female who presented to the ED today with abdominal pain.  Vital signs reviewed notable for mild hypertension.  On exam she is well-appearing.  She has mild tenderness to the right upper quadrant and right lower quadrant.  Differential including biliary colic, cholecystitis, appendicitis, diverticulitis, gastritis.  Low concern for mesenteric ischemia.  Also consider possible nephrolithiasis.  Given location of pain will plan to obtain CT scan and lab workup for evaluation.  EKG is nonischemic, low concern for ACS patient with no frank chest pain.   Patient placed on continuous vitals and telemetry monitoring while in ED which was reviewed periodically.  Reviewed and confirmed nursing documentation for past medical history, family history, social history.  Initial Study Results:   Laboratory  All laboratory results reviewed.  Labs notable for CMP notable for sodium 133, troponin negative x 2, CBC unremarkable.  Urinalysis concerning for possible infection.  EKG EKG was reviewed independently. Rate, rhythm, axis, intervals all examined and without medically relevant abnormality. ST segments without concerns for elevations.    Radiology:  All images reviewed independently.  CT Abdo pelvis unremarkable.  Agree with radiology report at this time.      Reassessment and Plan:   On reassessment she remained stable.  She still has some right upper quadrant pain and tenderness.  I am concerned she could have gallstones not seen on CT.  Ultrasound of the abdomen ordered.  Handoff was given to Dr. Wallace Cullens with plan to follow-up ultrasound and reevaluate.  Will need treatment for UTI if no other source is found.   Patient's presentation is most consistent with acute presentation with potential threat to life or  bodily function.           Final Clinical Impression(s) / ED Diagnoses Final diagnoses:  Generalized abdominal pain    Rx / DC Orders ED Discharge Orders     None         Laurence Spates, MD 04/05/23 1600

## 2023-04-05 NOTE — Discharge Instructions (Addendum)
Sent to ED

## 2023-04-05 NOTE — ED Notes (Signed)
Pt did not feel nauseous after eating or drinking.

## 2023-04-05 NOTE — ED Notes (Signed)
Patient is being discharged from the Urgent Care and sent to the Emergency Department via self . Per provider, patient is in need of higher level of care due to RUQ abd pain. Patient is aware and verbalizes understanding of plan of care.  Vitals:   04/05/23 1108  BP: 117/79  Pulse: 81  Resp: 16  Temp: 98.8 F (37.1 C)  SpO2: 98%

## 2023-04-05 NOTE — ED Triage Notes (Signed)
Pt reports she has pain along right upper abdomen, radiating to right flank for over a week. Pain is constant. Denies nausea, vomiting, fevers. Pain 7/10.

## 2023-04-05 NOTE — ED Notes (Addendum)
Pt was given crackers and water.  At this time Pt refused Mylanta.   She wanted to eat something first.

## 2023-04-05 NOTE — ED Provider Notes (Signed)
[facility]    CSN: [id number] Arrival date & time: 04/05/23  1006     History   Chief Complaint Chief Complaint  Patient presents with   Flank Pain    HPI Miranda Greene is a 67 y.o. female. Here with 1 week history of right sided pain Radiates from RUQ to right flank. Also pain down into RLQ Pain is rated 8/10 Some nausea but no vomiting. Diarrhea which is normal for her Ibuprofen helps temporarily. No medications yet today   Also some urinary frequency. No hematuria, dysuria, fever Reports history of interstitial cystitis   Has not noticed any rash or skin changes  Hx DM. 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   Lupus (HCC) 05/28/2020   S/P breast reconstruction, bilateral 08/10/2019   History of breast cancer 05/12/2019   Hyperlipidemia associated with type 2 diabetes mellitus (HCC) 06/15/2015   Uncontrolled type 2 diabetes mellitus with hyperglycemia (HCC) 06/15/2015   GERD (gastroesophageal reflux disease) 06/15/2015    Past Surgical History:  Procedure Laterality Date   ABDOMINAL HYSTERECTOMY     partial hysterectmy   AUGMENTATION MAMMAPLASTY Left 09/08/2019   BLADDER SURGERY     BREAST EXCISIONAL BIOPSY Right    benign 2009   BREAST IMPLANT REMOVAL Bilateral 04/18/2021   Procedure: REMOVAL BREAST IMPLANTS;  Surgeon: Peggye Form, DO;  Location: [facility];  Service: Plastics;  Laterality: Bilateral;   BREAST RECONSTRUCTION WITH PLACEMENT OF TISSUE EXPANDER AND FLEX HD (ACELLULAR HYDRATED DERMIS) Bilateral 06/21/2019   Procedure: BREAST  RECONSTRUCTION WITH PLACEMENT OF TISSUE EXPANDER AND FLEX HD (ACELLULAR HYDRATED DERMIS);  Surgeon: Peggye Form, DO;  Location: [facility];  Service: Plastics;  Laterality: Bilateral;   CAPSULECTOMY Bilateral 04/18/2021   Procedure: CAPSULECTOMY;  Surgeon: Peggye Form, DO;  Location: [facility];  Service: Plastics;  Laterality: Bilateral;   MASTECTOMY Bilateral 06/21/2019   MASTECTOMY W/ SENTINEL NODE BIOPSY Bilateral 06/21/2019   Procedure: BILATERAL MASTECTOMIES;  Surgeon: Abigail Miyamoto, MD;  Location: [facility];  Service: General;  Laterality: Bilateral;   REMOVAL OF BILATERAL TISSUE EXPANDERS WITH PLACEMENT OF BILATERAL BREAST IMPLANTS Bilateral 09/08/2019   Procedure: REMOVAL OF BILATERAL TISSUE EXPANDERS WITH PLACEMENT OF BILATERAL BREAST IMPLANTS;  Surgeon: Peggye Form, DO;  Location:  SURGERY CENTER;  Service: Plastics;  Laterality: Bilateral;   SENTINEL NODE BIOPSY Left 06/21/2019   Procedure: Left Sentinel Lymph Node Biopsy;  Surgeon: Abigail Miyamoto, MD;  Location: [facility] OR;  Service: General;  Laterality: Left;    OB History   No obstetric history on file.      Home Medications    Prior to Admission medications   Medication Sig Start Date End Date Taking? Authorizing Provider  ibuprofen (ADVIL,MOTRIN) 200 MG tablet Take 200 mg by mouth every 6 (six) hours as needed for moderate pain.    Yes [provider]  metFORMIN (GLUCOPHAGE) 850 MG tablet Take 1 tablet (850 mg total) by mouth with breakfast, with lunch, and with evening meal. 02/25/23  Yes Bedsole, Amy E, MD  Multiple Vitamins-Minerals (CENTRUM SILVER 50+WOMEN) TABS Take 1 tablet by mouth daily.   Yes [provider]  omeprazole (PRILOSEC) 20 MG capsule Take 20 mg by mouth daily.   Yes [provider]  SEMGLEE, YFGN, 100 UNIT/ML Pen INJECT 18 UNITS INTO THE SKIN  AT BEDTIME 09/15/22  Yes Bedsole, Amy E, MD  Probiotic Product (PROBIOTIC PO) Take 1 tablet  by mouth daily. Nauture Made    [provider]    Family History Family History  Problem Relation Age of Onset   Alcohol abuse Father    Lung cancer Father    Heart disease Father    Stroke Maternal Grandmother    Arthritis Paternal Grandmother    Heart disease Paternal Grandmother    Diabetes Paternal Grandmother    Arthritis Mother    Stroke Mother    Kidney disease Mother    Diabetes Mother    Dementia Mother     Social History Social History   Tobacco Use   Smoking status: Never   Smokeless tobacco: Never  Vaping Use   Vaping status: Never Used  Substance Use Topics   Alcohol use: No   Drug use: No     Allergies   Meloxicam and Invokana [canagliflozin]   Review of Systems Review of Systems As per HPI  Physical Exam Triage Vital Signs ED Triage Vitals  Encounter Vitals Group     BP --      Systolic BP Percentile --      Diastolic BP Percentile --      Pulse --      Resp --      Temp --      Temp src --      SpO2 --      Weight 04/05/23 1043 131 lb (59.4 kg)     Height 04/05/23 1043 5\' 3"  (1.6 m)     Head Circumference --      Peak Flow --      Pain Score 04/05/23 1042 8     Pain Loc --      Pain Education --      Exclude from Growth Chart --    No data found.  Updated Vital Signs BP 117/79 (BP Location: Right Arm)   Pulse 81   Temp 98.8 F (37.1 C) (Oral)   Resp 16   Ht 5\' 3"  (1.6 m)   Wt 131 lb (59.4 kg)   SpO2 98%   BMI 23.21 kg/m   Physical Exam Vitals and nursing note reviewed.  Constitutional:      Appearance: Normal appearance.  HENT:     Mouth/Throat:     Mouth: Mucous membranes are moist.     Pharynx: Oropharynx is clear.  Eyes:     Conjunctiva/sclera: Conjunctivae normal.  Cardiovascular:     Rate and Rhythm: Normal rate and regular rhythm.     Heart sounds: Normal heart sounds.  Pulmonary:     Effort: Pulmonary effort is normal. No respiratory distress.     Breath sounds: Normal breath sounds.   Abdominal:     General: Bowel sounds are normal.     Palpations: Abdomen is soft.     Tenderness: There is abdominal tenderness. There is no right CVA tenderness, left CVA tenderness, guarding or rebound. Positive signs include Murphy's sign.     Comments: Tender throughout with increased pain RUQ and RLQ. +murphy's sign  Musculoskeletal:  General: Normal range of motion.  Skin:    General: Skin is warm and dry.  Neurological:     Mental Status: She is alert and oriented to person, place, and time.      UC Treatments / Results  Labs (all labs ordered are listed, but only abnormal results are displayed) Labs Reviewed  POCT URINALYSIS DIP (MANUAL ENTRY) - Abnormal; Notable for the following components:      Result Value   Glucose, UA =250 (*)    Blood, UA trace-intact (*)    Leukocytes, UA Moderate (2+) (*)    All other components within normal limits    EKG  Radiology No results found.  Procedures Procedures (including critical care time)  Medications Ordered in UC Medications - No data to display  Initial Impression / Assessment and Plan / UC Course  I have reviewed the triage vital signs and the nursing notes.  Pertinent labs & imaging results that were available during my care of the patient were reviewed by me and considered in my medical decision making (see chart for details).  Patient very uncomfortable with exam She is tender to light palpation, most notably in RUQ. UA with trace blood and moderate leuks She is afebrile and vitals stable. Fasting CBG 93  Discussion with patient about possible etiologies of pain, consider gallbladder.  Offered Toradol IM in clinic and reassess pain after medication.  Otherwise discussed being evaluated in the emergency department for higher level of care, advanced imaging and lab work.  Patient reports she would like to know what is wrong and since she has so much pain she wants to be seen in the emergency department.  I  have sent her to the ED via POV with husband.  Final Clinical Impressions(s) / UC Diagnoses   Final diagnoses:  RUQ pain  Generalized abdominal pain     Discharge Instructions      Sent to ED     ED Prescriptions   None    PDMP not reviewed this encounter.   Kathrine Haddock 04/05/23 1115

## 2023-04-05 NOTE — ED Provider Notes (Signed)
  Provider Note MRN:  [id number]  Arrival date & time: 04/05/23    ED Course and Medical Decision Making  Assumed care from Dr Earlene Plater at shift change.  See note from prior team for complete details, in brief:  67 yo female Post prandial RUQ pain x1 week CT stable [place] pending Labs stable   Plan per prior physician f/u US and PO chall   Imaging stable Tolerating PO Feeling better overall Non-toxic Afebrile Not septic She has UTI, given flank pain possibly early pyelo Send urine culture CT stable, no stone Given rocephin here Send home with cefdinir  F/u closely with pcp  The patient improved significantly and was discharged in stable condition. Detailed discussions were had with the patient regarding current findings, and need for close f/u with PCP or on call doctor. The patient has been instructed to return immediately if the symptoms worsen in any way for re-evaluation. Patient verbalized understanding and is in agreement with current care plan. All questions answered prior to discharge.       Procedures  Final Clinical Impressions(s) / ED Diagnoses     ICD-10-CM   1. Urinary tract infection without hematuria, site unspecified  N39.0     2. Generalized abdominal pain  R10.84       ED Discharge Orders          Ordered    cefdinir (OMNICEF) 300 MG capsule  2 times daily        04/05/23 1905    ondansetron (ZOFRAN) 4 MG tablet  Every 4 hours PRN        04/05/23 1905    oxyCODONE (ROXICODONE) 5 MG immediate release tablet  Every 6 hours PRN        04/05/23 1912              Discharge Instructions      It was a pleasure caring for you today in the emergency department.  Please return to the emergency department for any worsening or worrisome symptoms.  Please follow up with your PCP for a recheck         Sloan Leiter, DO 04/05/23 1914

## 2023-04-05 NOTE — Discharge Instructions (Addendum)
It was a pleasure caring for you today in the emergency department.  Please return to the emergency department for any worsening or worrisome symptoms.  Please follow up with your PCP for a recheck

## 2023-04-07 ENCOUNTER — Encounter: Payer: Self-pay | Admitting: Family Medicine

## 2023-04-07 LAB — URINE CULTURE: Culture: >=100000 — AB

## 2023-04-08 ENCOUNTER — Telehealth (HOSPITAL_BASED_OUTPATIENT_CLINIC_OR_DEPARTMENT_OTHER): Payer: Self-pay | Admitting: *Deleted

## 2023-04-08 NOTE — Telephone Encounter (Signed)
Post ED Visit - Positive Culture Follow-up  Culture report reviewed by antimicrobial stewardship pharmacist: [facility] [x]  Francene Finders [place], [place].D. []  Celedonio Miyamoto, Pharm.D., BCPS AQ-ID []  Garvin Fila, Pharm.D., BCPS []  Georgina Pillion, [address].D., BCPS []  Anna Maria, [address].D., BCPS, AAHIVP []  Estella Husk, Pharm.D., BCPS, AAHIVP []  Lysle Pearl, PharmD, BCPS []  Phillips Climes, PharmD, BCPS []  Agapito Games, PharmD, BCPS []  Verlan Friends, PharmD []  Mervyn Gay, PharmD, BCPS []  Vinnie Level, PharmD  [facility] []  Len Childs, PharmD []  Greer Pickerel, PharmD []  Adalberto Cole, PharmD []  Perlie Gold, Rph []  Lonell Face) Jean Rosenthal, PharmD []  Earl Many, PharmD []  Junita Push, PharmD []  Dorna Leitz, PharmD []  Terrilee Files, PharmD []  Lynann Beaver, PharmD []  Keturah Barre, PharmD []  Loralee Pacas, PharmD []  Bernadene Person, PharmD   Positive urine culture Treated with Cefdinir, organism sensitive to the same and no further patient follow-up is required at this time.  Virl Axe [facility] 04/08/2023, 10:49 AM

## 2023-04-09 ENCOUNTER — Encounter (INDEPENDENT_AMBULATORY_CARE_PROVIDER_SITE_OTHER): Payer: Self-pay

## 2023-04-16 ENCOUNTER — Other Ambulatory Visit: Payer: BC Managed Care – PPO

## 2023-04-16 DIAGNOSIS — E1165 Type 2 diabetes mellitus with hyperglycemia: Secondary | ICD-10-CM | POA: Diagnosis not present

## 2023-04-16 DIAGNOSIS — E1169 Type 2 diabetes mellitus with other specified complication: Secondary | ICD-10-CM | POA: Diagnosis not present

## 2023-04-16 DIAGNOSIS — E785 Hyperlipidemia, unspecified: Secondary | ICD-10-CM | POA: Diagnosis not present

## 2023-04-16 DIAGNOSIS — Z7984 Long term (current) use of oral hypoglycemic drugs: Secondary | ICD-10-CM | POA: Diagnosis not present

## 2023-04-16 LAB — HEMOGLOBIN A1C: Hgb A1c MFr Bld: 7 % — ABNORMAL HIGH (ref 4.6–6.5)

## 2023-04-16 LAB — LIPID PANEL
Cholesterol: 175 mg/dL (ref 0–200)
HDL: 36.9 mg/dL — ABNORMAL LOW (ref >39.00)
LDL Cholesterol: 109 mg/dL — ABNORMAL HIGH (ref 0–99)
NonHDL: 137.76
Total CHOL/HDL Ratio: 5
Triglycerides: 145 mg/dL (ref 0.0–149.0)
VLDL: 29 mg/dL (ref 0.0–40.0)

## 2023-04-16 LAB — COMPREHENSIVE METABOLIC PANEL
ALT: 14 U/L (ref 0–35)
AST: 22 U/L (ref 0–37)
Albumin: 4.2 g/dL (ref 3.5–5.2)
Alkaline Phosphatase: 56 U/L (ref 39–117)
BUN: 18 mg/dL (ref 6–23)
CO2: 28 mEq/L (ref 19–32)
Calcium: 9.4 mg/dL (ref 8.4–10.5)
Chloride: 99 mEq/L (ref 96–112)
Creatinine, Ser: 0.9 mg/dL (ref 0.40–1.20)
GFR: 67.17 mL/min (ref >60.00)
Glucose, Bld: 113 mg/dL — ABNORMAL HIGH (ref 70–99)
Potassium: 4.6 mEq/L (ref 3.5–5.1)
Sodium: 135 mEq/L (ref 135–145)
Total Bilirubin: 0.3 mg/dL (ref 0.2–1.2)
Total Protein: 7.1 g/dL (ref 6.0–8.3)

## 2023-04-16 NOTE — Progress Notes (Signed)
No critical labs need to be addressed urgently. We will discuss labs in detail at upcoming office visit.   

## 2023-04-23 ENCOUNTER — Ambulatory Visit (INDEPENDENT_AMBULATORY_CARE_PROVIDER_SITE_OTHER): Payer: BC Managed Care – PPO | Admitting: Family Medicine

## 2023-04-23 ENCOUNTER — Encounter: Payer: Self-pay | Admitting: Family Medicine

## 2023-04-23 VITALS — BP 130/76 | HR 91 | Temp 97.8°F | Ht 63.75 in | Wt 131.0 lb

## 2023-04-23 DIAGNOSIS — Z7984 Long term (current) use of oral hypoglycemic drugs: Secondary | ICD-10-CM

## 2023-04-23 DIAGNOSIS — K529 Noninfective gastroenteritis and colitis, unspecified: Secondary | ICD-10-CM

## 2023-04-23 DIAGNOSIS — E1165 Type 2 diabetes mellitus with hyperglycemia: Secondary | ICD-10-CM | POA: Diagnosis not present

## 2023-04-23 DIAGNOSIS — Z794 Long term (current) use of insulin: Secondary | ICD-10-CM

## 2023-04-23 DIAGNOSIS — E1169 Type 2 diabetes mellitus with other specified complication: Secondary | ICD-10-CM | POA: Diagnosis not present

## 2023-04-23 DIAGNOSIS — R1012 Left upper quadrant pain: Secondary | ICD-10-CM

## 2023-04-23 DIAGNOSIS — E119 Type 2 diabetes mellitus without complications: Secondary | ICD-10-CM | POA: Diagnosis not present

## 2023-04-23 DIAGNOSIS — R1011 Right upper quadrant pain: Secondary | ICD-10-CM | POA: Diagnosis not present

## 2023-04-23 DIAGNOSIS — E785 Hyperlipidemia, unspecified: Secondary | ICD-10-CM

## 2023-04-23 NOTE — Assessment & Plan Note (Signed)
Chronic, poor control. Intolerant of statins and not interested in retrial or alternate medications for cholesterol at this time.  The 10-year ASCVD risk score (Arnett DK, et al., 2019) is: 11.9%   Values used to calculate the score:     Age: 67 years     Sex: Female     Is Non-Hispanic African American: No     Diabetic: Yes     Tobacco smoker: No     Systolic Blood Pressure: 130 mmHg     Is BP treated: No     HDL Cholesterol: 36.9 mg/dL     Total Cholesterol: 175 mg/dL   Did discuss mother new MI/ CAD history increase her risk.

## 2023-04-23 NOTE — Assessment & Plan Note (Addendum)
Improved  control, chronic.   She has had improvement with lifestyle changes.. no need for med change at this time.   metformin 850 mg  3 times daily  ( at max dose) SE to invokana.. cannot tolerate SGlt2i. Semglee 18 units daily  Will need to change semglee to lantus at next refill for insurance change to Medicare.

## 2023-04-23 NOTE — Assessment & Plan Note (Signed)
Acute, unclear cause.  CT abdomen pelvis and ultrasound unremarkable except for distended gallbladder and mild fatty infiltration. Symptoms are somewhat atypical for gallbladder disease but can consider HIDA scan if right upper quadrant pain postprandial continuing to worsen.  Pain potentially due to IBS D with associated pain.  Keep up with fluids.  Electrolytes within the normal range.  Use probiotic and consider FODMAP diet.

## 2023-04-23 NOTE — Progress Notes (Signed)
Patient ID: Miranda Greene, female    DOB: 1956/08/08, 67 y.o.   MRN: [id number]  This visit was conducted in person.  BP 130/76   Pulse 91   Temp 97.8 F (36.6 C) (Temporal)   Ht 5' 3.75" (1.619 m)   Wt 131 lb (59.4 kg)   SpO2 97%   BMI 22.66 kg/m    CC:  Chief Complaint  Patient presents with   Diabetes    Needs to change insulin due to insurance     Subjective:   HPI: Miranda Greene is a 67 y.o. female presenting on 04/23/2023 for Diabetes (Needs to change insulin due to insurance )   She has lost her job and is looking for a new one. Insurance will cahnge to [employer]. Will need to change Semglee to lantus  at next refill.   [facility] visit for UTI 04/05/23...  U culture: ECOli completed cefdinir  No further dysuria  Still with  intermittent pain under ribs after eating or drinking... bilaterally... has been getting better slowly  Continued diarrhea.. chronic issue.  CT abd Pelvis:  No obstruction, scatter stool , fatty liver.  US showed Distended gallbladder, mild fatty liver infiltration.  Elevated Cholesterol:  Improved with lifestyle changes. SE to statin x 2  in past.. myalgia Lab Results  Component Value Date   CHOL 175 04/16/2023   HDL 36.90 (L) 04/16/2023   LDLCALC 109 (H) 04/16/2023   TRIG 145.0 04/16/2023   CHOLHDL 5 04/16/2023  Using medications without problems: Muscle aches:  Diet compliance: low carb Exercise: walking daily Other complaints:  Wt Readings from Last 3 Encounters:  04/23/23 131 lb (59.4 kg)  04/05/23 131 lb (59.4 kg)  04/05/23 131 lb (59.4 kg)     Diabetes:  Improved  control on  Semglee 18 unit daily, metformin 850 mg  3 times daily ( at max dose)  SE to invokana.. cannot tolerate SGlt2i... could try GLP1 family if needed in future.   She has stopped drinking flavored waters. Lab Results  Component Value Date   HGBA1C 7.0 (H) 04/16/2023  Using medications without difficulties:  daily diarrhea.. tolerable. Hypoglycemic  episodes:  rare Hyperglycemic episodes: occ Feet problems: none Blood Sugars averaging: FBS 95-125 eye exam within last year: yes    Relevant past medical, surgical, family and social history reviewed and updated as indicated. Interim medical history since our last visit reviewed. Allergies and medications reviewed and updated. Outpatient Medications Prior to Visit  Medication Sig Dispense Refill   ibuprofen (ADVIL,MOTRIN) 200 MG tablet Take 200 mg by mouth every 6 (six) hours as needed for moderate pain.      metFORMIN (GLUCOPHAGE) 850 MG tablet Take 1 tablet (850 mg total) by mouth with breakfast, with lunch, and with evening meal. 270 tablet 1   Multiple Vitamins-Minerals (CENTRUM SILVER 50+WOMEN) TABS Take 1 tablet by mouth daily.     omeprazole (PRILOSEC) 20 MG capsule Take 20 mg by mouth daily.     SEMGLEE, YFGN, 100 UNIT/ML Pen INJECT 18 UNITS INTO THE SKIN  AT BEDTIME 15 mL 4   ondansetron (ZOFRAN) 4 MG tablet Take 1 tablet (4 mg total) by mouth every 4 (four) hours as needed for nausea or vomiting. 6 tablet 0   oxyCODONE (ROXICODONE) 5 MG immediate release tablet Take 1 tablet (5 mg total) by mouth every 6 (six) hours as needed for severe pain. 8 tablet 0   Probiotic Product (PROBIOTIC PO) Take 1 tablet by  mouth daily. [place] Made     No facility-administered medications prior to visit.     Per HPI unless specifically indicated in ROS section below Review of Systems  Constitutional:  Negative for fatigue and fever.  HENT:  Negative for congestion.   Eyes:  Negative for pain.  Respiratory:  Negative for cough and shortness of breath.   Cardiovascular:  Negative for chest pain, palpitations and leg swelling.  Gastrointestinal:  Negative for abdominal pain.  Genitourinary:  Negative for dysuria and vaginal bleeding.  Musculoskeletal:  Negative for back pain.  Neurological:  Negative for syncope, light-headedness and headaches.  Psychiatric/Behavioral:  Negative for dysphoric  mood.    Objective:  BP 130/76   Pulse 91   Temp 97.8 F (36.6 C) (Temporal)   Ht 5' 3.75" (1.619 m)   Wt 131 lb (59.4 kg)   SpO2 97%   BMI 22.66 kg/m   Wt Readings from Last 3 Encounters:  04/23/23 131 lb (59.4 kg)  04/05/23 131 lb (59.4 kg)  04/05/23 131 lb (59.4 kg)      Physical Exam Vitals and nursing note reviewed.  Constitutional:      General: She is not in acute distress.    Appearance: Normal appearance. She is well-developed. She is not ill-appearing or toxic-appearing.  HENT:     Head: Normocephalic.     Right Ear: Hearing, tympanic membrane, ear canal and external ear normal.     Left Ear: Hearing, tympanic membrane, ear canal and external ear normal.     Nose: Nose normal.  Eyes:     General: Lids are normal. Lids are everted, no foreign bodies appreciated.     Conjunctiva/sclera: Conjunctivae normal.     Pupils: Pupils are equal, round, and reactive to light.  Neck:     Thyroid: No thyroid mass or thyromegaly.     Vascular: No carotid bruit.     Trachea: Trachea normal.  Cardiovascular:     Rate and Rhythm: Normal rate and regular rhythm.     Heart sounds: Normal heart sounds, S1 normal and S2 normal. No murmur heard.    No gallop.  Pulmonary:     Effort: Pulmonary effort is normal. No respiratory distress.     Breath sounds: Normal breath sounds. No wheezing, rhonchi or rales.  Abdominal:     General: Bowel sounds are normal. There is no distension or abdominal bruit.     Palpations: Abdomen is soft. There is no fluid wave or mass.     Tenderness: There is generalized abdominal tenderness. There is no guarding or rebound.     Hernia: No hernia is present.  Musculoskeletal:     Cervical back: Normal range of motion and neck supple.  Lymphadenopathy:     Cervical: No cervical adenopathy.  Skin:    General: Skin is warm and dry.     Findings: No rash.  Neurological:     Mental Status: She is alert.     Cranial Nerves: No cranial nerve deficit.      Sensory: No sensory deficit.  Psychiatric:        Mood and Affect: Mood is not anxious or depressed.        Speech: Speech normal.        Behavior: Behavior normal. Behavior is cooperative.        Judgment: Judgment normal.          Results for orders placed or performed in visit on 04/16/23  Lipid panel  Result Value Ref Range   Cholesterol 175 0 - 200 mg/dL   Triglycerides 188.4 0.0 - 149.0 mg/dL   HDL 16.60 (L) >63.01 mg/dL   VLDL 60.1 0.0 - 09.3 mg/dL   LDL Cholesterol 235 (H) 0 - 99 mg/dL   Total CHOL/HDL Ratio 5    NonHDL 137.76   Hemoglobin A1c  Result Value Ref Range   Hgb A1c MFr Bld 7.0 (H) 4.6 - 6.5 %  Comprehensive metabolic panel  Result Value Ref Range   Sodium 135 135 - 145 mEq/L   Potassium 4.6 3.5 - 5.1 mEq/L   Chloride 99 96 - 112 mEq/L   CO2 28 19 - 32 mEq/L   Glucose, Bld 113 (H) 70 - 99 mg/dL   BUN 18 6 - 23 mg/dL   Creatinine, Ser 5.73 0.40 - 1.20 mg/dL   Total Bilirubin 0.3 0.2 - 1.2 mg/dL   Alkaline Phosphatase 56 39 - 117 U/L   AST 22 0 - 37 U/L   ALT 14 0 - 35 U/L   Total Protein 7.1 6.0 - 8.3 g/dL   Albumin 4.2 3.5 - 5.2 g/dL   GFR 22.02 >54.27 mL/min   Calcium 9.4 8.4 - 10.5 mg/dL     COVID 19 screen:  No recent travel or known exposure to COVID19 The patient denies respiratory symptoms of COVID 19 at this time. The importance of social distancing was discussed today.   Assessment and Plan   Problem List Items Addressed This Visit     Bilateral upper abdominal pain    Acute, unclear cause.  CT abdomen pelvis and ultrasound unremarkable except for distended gallbladder and mild fatty infiltration. Symptoms are somewhat atypical for gallbladder disease but can consider HIDA scan if right upper quadrant pain postprandial continuing to worsen.  Pain potentially due to IBS D with associated pain.  Keep up with fluids.  Electrolytes within the normal range.  Use probiotic and consider FODMAP diet.      Chronic diarrhea     Chronic,  ongoing for years.  Recent imaging unremarkable. Last colonoscopy: January 2022  Most consistent with likely irritable bowel syndrome.  Encouraged her to use probiotic, fiber, keep food diary to determine possible foods that worsen symptoms.  She can consider looking into the FODMAP diet. If worsening we will consider further evaluation and possible referral to GI.      Hyperlipidemia associated with type 2 diabetes mellitus (HCC) - Primary     Chronic, poor control. Intolerant of statins and not interested in retrial or alternate medications for cholesterol at this time.  The 10-year ASCVD risk score (Arnett DK, et al., 2019) is: 11.9%   Values used to calculate the score:     Age: 69 years     Sex: Female     Is Non-Hispanic African American: No     Diabetic: Yes     Tobacco smoker: No     Systolic Blood Pressure: 130 mmHg     Is BP treated: No     HDL Cholesterol: 36.9 mg/dL     Total Cholesterol: 175 mg/dL   Did discuss mother new MI/ CAD history increase her risk.       Type 2 diabetes mellitus with hyperglycemia (HCC)    Improved  control, chronic.   She has had improvement with lifestyle changes.. no need for med change at this time.   metformin 850 mg  3 times daily  ( at max dose) SE to invokana.. cannot  tolerate SGlt2i. Semglee 18 units daily  Will need to change semglee to lantus at next refill for insurance change to Medicare.      Other Visit Diagnoses     Diabetes mellitus treated with insulin and oral medication (HCC)           Kerby Nora, MD

## 2023-04-23 NOTE — Assessment & Plan Note (Signed)
Chronic, ongoing for years.  Recent imaging unremarkable. Last colonoscopy: January 2022  Most consistent with likely irritable bowel syndrome.  Encouraged her to use probiotic, fiber, keep food diary to determine possible foods that worsen symptoms.  She can consider looking into the FODMAP diet. If worsening we will consider further evaluation and possible referral to GI.

## 2023-04-23 NOTE — Patient Instructions (Signed)
If upper abdominal pain consider HIDA scan to evaluate function of gallbladder. is not continuing to improve.  Restart probiotic when able.

## 2023-04-24 ENCOUNTER — Encounter: Payer: Self-pay | Admitting: Family Medicine

## 2023-04-25 ENCOUNTER — Ambulatory Visit: Payer: BC Managed Care – PPO | Admitting: Internal Medicine

## 2023-04-25 ENCOUNTER — Encounter: Payer: Self-pay | Admitting: Internal Medicine

## 2023-04-25 VITALS — BP 112/80 | HR 82 | Temp 97.6°F | Ht 63.75 in | Wt 132.0 lb

## 2023-04-25 DIAGNOSIS — N3090 Cystitis, unspecified without hematuria: Secondary | ICD-10-CM

## 2023-04-25 DIAGNOSIS — R300 Dysuria: Secondary | ICD-10-CM

## 2023-04-25 LAB — POC URINALSYSI DIPSTICK (AUTOMATED)
Bilirubin, UA: NEGATIVE
Blood, UA: NEGATIVE
Glucose, UA: NEGATIVE
Ketones, UA: NEGATIVE
Nitrite, UA: NEGATIVE
Protein, UA: NEGATIVE
Spec Grav, UA: 1.02 (ref 1.010–1.025)
Urobilinogen, UA: 0.2 E.U./dL
pH, UA: 6 (ref 5.0–8.0)

## 2023-04-25 MED ORDER — SULFAMETHOXAZOLE-TRIMETHOPRIM 800-160 MG PO TABS: 1.0000 | ORAL_TABLET | Freq: Two times a day (BID) | ORAL | 2 refills | Status: DC

## 2023-04-25 NOTE — Assessment & Plan Note (Signed)
May well have interstitial cystitis--but should have urology evaluation (has sling that may be affecting emptying) Could have infection again Will give septra DS bid x 3 days (with refills) Send culture Discussed considering starting low dose amitriptyline 10-20mg  at night---will hold off ?cranberry

## 2023-04-25 NOTE — Addendum Note (Signed)
Addended by: Eual Fines on: 04/25/2023 09:27 AM   Modules accepted: Orders

## 2023-04-25 NOTE — Progress Notes (Signed)
Subjective:    Patient ID: Miranda Greene, female    DOB: 1956/07/17, 67 y.o.   MRN: [id number]  HPI Here due to recurrent urinary symptoms  Diagnosed in ER 8/27--with UTI Had a month of painful voids (like bladder constricting)--thought it was interstitial cystitis (diagnosed by Dr Hassan Rowan sling that was implanted) No urologist though Got cefdinir---did help some after a while. Dysuria resolved Then pain returned yesterday--bad  Tried cystex--helped Tylenol Significant frequency--pain continues  No vaginal products No intercourse  Some low grade fever  Current Outpatient Medications on File Prior to Visit  Medication Sig Dispense Refill   ibuprofen (ADVIL,MOTRIN) 200 MG tablet Take 200 mg by mouth every 6 (six) hours as needed for moderate pain.      metFORMIN (GLUCOPHAGE) 850 MG tablet Take 1 tablet (850 mg total) by mouth with breakfast, with lunch, and with evening meal. 270 tablet 1   Multiple Vitamins-Minerals (CENTRUM SILVER 50+WOMEN) TABS Take 1 tablet by mouth daily.     omeprazole (PRILOSEC) 20 MG capsule Take 20 mg by mouth daily.     Probiotic Product (PROBIOTIC BLEND PO) Take by mouth.     SEMGLEE, YFGN, 100 UNIT/ML Pen INJECT 18 UNITS INTO THE SKIN  AT BEDTIME 15 mL 4   No current facility-administered medications on file prior to visit.    Allergies  Allergen Reactions   Meloxicam Hives   Invokana [Canagliflozin] Rash    Past Medical History:  Diagnosis Date   Arthritis    Hands, feet   Breast cancer (HCC) 2020   Cancer (HCC)    bil breast cancer   Diabetes mellitus    GERD (gastroesophageal reflux disease)    Hyperlipidemia    Lupus (HCC)     Past Surgical History:  Procedure Laterality Date   ABDOMINAL HYSTERECTOMY     partial hysterectmy   AUGMENTATION MAMMAPLASTY Left 09/08/2019   BLADDER SURGERY     BREAST EXCISIONAL BIOPSY Right    benign 2009   BREAST IMPLANT REMOVAL Bilateral 04/18/2021   Procedure: REMOVAL BREAST  IMPLANTS;  Surgeon: Peggye Form, DO;  Location: [facility];  Service: Plastics;  Laterality: Bilateral;   BREAST RECONSTRUCTION WITH PLACEMENT OF TISSUE EXPANDER AND FLEX HD (ACELLULAR HYDRATED DERMIS) Bilateral 06/21/2019   Procedure: BREAST RECONSTRUCTION WITH PLACEMENT OF TISSUE EXPANDER AND FLEX HD (ACELLULAR HYDRATED DERMIS);  Surgeon: Peggye Form, DO;  Location: [facility] OR;  Service: Plastics;  Laterality: Bilateral;   CAPSULECTOMY Bilateral 04/18/2021   Procedure: CAPSULECTOMY;  Surgeon: Peggye Form, DO;  Location: [facility];  Service: Plastics;  Laterality: Bilateral;   MASTECTOMY Bilateral 06/21/2019   MASTECTOMY W/ SENTINEL NODE BIOPSY Bilateral 06/21/2019   Procedure: BILATERAL MASTECTOMIES;  Surgeon: Abigail Miyamoto, MD;  Location: [facility] OR;  Service: General;  Laterality: Bilateral;   REMOVAL OF BILATERAL TISSUE EXPANDERS WITH PLACEMENT OF BILATERAL BREAST IMPLANTS Bilateral 09/08/2019   Procedure: REMOVAL OF BILATERAL TISSUE EXPANDERS WITH PLACEMENT OF BILATERAL BREAST IMPLANTS;  Surgeon: Peggye Form, DO;  Location: [facility];  Service: Plastics;  Laterality: Bilateral;   SENTINEL NODE BIOPSY Left 06/21/2019   Procedure: Left Sentinel Lymph Node Biopsy;  Surgeon: Abigail Miyamoto, MD;  Location[facility];  Service: General;  Laterality: Left;    Family History  Problem Relation Age of Onset   Alcohol abuse Father    Lung cancer Father    Heart disease Father    Stroke Maternal Grandmother    Arthritis Paternal Grandmother  Heart disease Paternal Grandmother    Diabetes Paternal Grandmother    Arthritis Mother    Stroke Mother    Kidney disease Mother    Diabetes Mother    Dementia Mother     Social History   Socioeconomic History   Marital status: Married    Spouse name: Not on file   Number of children: 4   Years of education: Not on file   Highest education level: Not on file   Occupational History   Occupation: Air cabin crew  Tobacco Use   Smoking status: Never   Smokeless tobacco: Never  Vaping Use   Vaping status: Never Used  Substance and Sexual Activity   Alcohol use: No   Drug use: No   Sexual activity: Not on file  Other Topics Concern   Not on file  Social History Narrative   Married.   4 children.    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   Enjoys reading, spending time with her grandchildren.   Social Determinants of Health   Financial Resource Strain: Low Risk  (01/17/2023)   Overall Financial Resource Strain (CARDIA)    Difficulty of Paying Living Expenses: Not hard at all  Food Insecurity: No Food Insecurity (01/17/2023)   Hunger Vital Sign    Worried About Running Out of Food in the Last Year: Never true    Ran Out of Food in the Last Year: Never true  Transportation Needs: No Transportation Needs (01/17/2023)   PRAPARE - Administrator, Civil Service (Medical): No    Lack of Transportation (Non-Medical): No  Physical Activity: Insufficiently Active (01/17/2023)   Exercise Vital Sign    Days of Exercise per Week: 5 days    Minutes of Exercise per Session: 20 min  Stress: No Stress Concern Present (01/17/2023)   [employer] of Occupational Health - Occupational Stress Questionnaire    Feeling of Stress : Not at all  Social Connections: Unknown (01/17/2023)   Social Connection and Isolation Panel [NHANES]    Frequency of Communication with Friends and Family: Patient declined    Frequency of Social Gatherings with Friends and Family: Patient declined    Attends Religious Services: Patient declined    Database administrator or Organizations: Patient declined    Attends Engineer, structural: Not on file    Marital Status: Married  Catering manager Violence: Not on file   Review of Systems No vaginal changes that she knows of Has upper back pain    Objective:   Physical  Exam Abdominal:     Comments: Mild suprapubic tenderness Mild right and left CVA tenderness             Assessment & Plan:

## 2023-04-26 LAB — URINE CULTURE
MICRO NUMBER:: 15342039
Result:: NO GROWTH
SPECIMEN QUALITY:: ADEQUATE

## 2023-04-29 ENCOUNTER — Encounter: Payer: Self-pay | Admitting: *Deleted

## 2023-09-09 ENCOUNTER — Encounter: Payer: Self-pay | Admitting: Family Medicine

## 2023-09-11 MED ORDER — LANTUS SOLOSTAR 100 UNIT/ML ~~LOC~~ SOPN: 18.0000 [IU] | PEN_INJECTOR | Freq: Every day | SUBCUTANEOUS | 3 refills | Status: DC

## 2023-09-17 MED ORDER — LANTUS SOLOSTAR 100 UNIT/ML ~~LOC~~ SOPN: 18.0000 [IU] | PEN_INJECTOR | Freq: Every day | SUBCUTANEOUS | 3 refills | Status: DC

## 2023-09-17 NOTE — Addendum Note (Signed)
 Addended by: Kerby Nora E on: 09/17/2023 02:15 PM   Modules accepted: Orders

## 2023-10-02 ENCOUNTER — Telehealth: Payer: Self-pay | Admitting: *Deleted

## 2023-10-02 DIAGNOSIS — E1169 Type 2 diabetes mellitus with other specified complication: Secondary | ICD-10-CM

## 2023-10-02 DIAGNOSIS — E119 Type 2 diabetes mellitus without complications: Secondary | ICD-10-CM

## 2023-10-02 NOTE — Telephone Encounter (Signed)
-----   Message from Alvina Chou sent at 10/02/2023 10:34 AM EST ----- Regarding: lab orders for Tue, 2.11.25 Patient is scheduled for CPX labs, please order future labs, Thanks , Camelia Eng

## 2023-10-21 ENCOUNTER — Encounter: Payer: Self-pay | Admitting: Family Medicine

## 2023-10-21 ENCOUNTER — Other Ambulatory Visit (INDEPENDENT_AMBULATORY_CARE_PROVIDER_SITE_OTHER): Payer: HMO

## 2023-10-21 DIAGNOSIS — E785 Hyperlipidemia, unspecified: Secondary | ICD-10-CM | POA: Diagnosis not present

## 2023-10-21 DIAGNOSIS — E119 Type 2 diabetes mellitus without complications: Secondary | ICD-10-CM | POA: Diagnosis not present

## 2023-10-21 DIAGNOSIS — Z794 Long term (current) use of insulin: Secondary | ICD-10-CM | POA: Diagnosis not present

## 2023-10-21 DIAGNOSIS — Z7984 Long term (current) use of oral hypoglycemic drugs: Secondary | ICD-10-CM | POA: Diagnosis not present

## 2023-10-21 DIAGNOSIS — E1169 Type 2 diabetes mellitus with other specified complication: Secondary | ICD-10-CM

## 2023-10-21 LAB — COMPREHENSIVE METABOLIC PANEL WITH GFR
ALT: 10 U/L (ref 0–35)
AST: 16 U/L (ref 0–37)
Albumin: 4.3 g/dL (ref 3.5–5.2)
Alkaline Phosphatase: 59 U/L (ref 39–117)
BUN: 15 mg/dL (ref 6–23)
CO2: 30 meq/L (ref 19–32)
Calcium: 9 mg/dL (ref 8.4–10.5)
Chloride: 101 meq/L (ref 96–112)
Creatinine, Ser: 0.89 mg/dL (ref 0.40–1.20)
GFR: 67.83 mL/min
Glucose, Bld: 79 mg/dL (ref 70–99)
Potassium: 4 meq/L (ref 3.5–5.1)
Sodium: 138 meq/L (ref 135–145)
Total Bilirubin: 0.6 mg/dL (ref 0.2–1.2)
Total Protein: 7.1 g/dL (ref 6.0–8.3)

## 2023-10-21 LAB — LIPID PANEL
Cholesterol: 179 mg/dL (ref 0–200)
HDL: 39.5 mg/dL (ref >39.00)
LDL Cholesterol: 109 mg/dL — ABNORMAL HIGH (ref 0–99)
NonHDL: 139.33
Total CHOL/HDL Ratio: 5
Triglycerides: 152 mg/dL — ABNORMAL HIGH (ref 0.0–149.0)
VLDL: 30.4 mg/dL (ref 0.0–40.0)

## 2023-10-21 LAB — HEMOGLOBIN A1C: Hgb A1c MFr Bld: 7.7 % — ABNORMAL HIGH (ref 4.6–6.5)

## 2023-10-21 NOTE — Addendum Note (Signed)
Addended by: Alvina Chou on: 10/21/2023 11:51 AM   Modules accepted: Orders

## 2023-10-21 NOTE — Progress Notes (Signed)
No critical labs need to be addressed urgently. We will discuss labs in detail at upcoming office visit.

## 2023-10-28 ENCOUNTER — Encounter: Payer: Self-pay | Admitting: Family Medicine

## 2023-10-28 ENCOUNTER — Ambulatory Visit (INDEPENDENT_AMBULATORY_CARE_PROVIDER_SITE_OTHER): Payer: PPO | Admitting: Family Medicine

## 2023-10-28 VITALS — BP 120/74 | HR 96 | Temp 97.5°F | Ht 63.5 in | Wt 129.1 lb

## 2023-10-28 DIAGNOSIS — E854 Organ-limited amyloidosis: Secondary | ICD-10-CM | POA: Diagnosis not present

## 2023-10-28 DIAGNOSIS — F330 Major depressive disorder, recurrent, mild: Secondary | ICD-10-CM | POA: Diagnosis not present

## 2023-10-28 DIAGNOSIS — Z23 Encounter for immunization: Secondary | ICD-10-CM

## 2023-10-28 DIAGNOSIS — Z7984 Long term (current) use of oral hypoglycemic drugs: Secondary | ICD-10-CM

## 2023-10-28 DIAGNOSIS — E785 Hyperlipidemia, unspecified: Secondary | ICD-10-CM | POA: Diagnosis not present

## 2023-10-28 DIAGNOSIS — Z0000 Encounter for general adult medical examination without abnormal findings: Secondary | ICD-10-CM

## 2023-10-28 DIAGNOSIS — E1169 Type 2 diabetes mellitus with other specified complication: Secondary | ICD-10-CM

## 2023-10-28 DIAGNOSIS — E119 Type 2 diabetes mellitus without complications: Secondary | ICD-10-CM

## 2023-10-28 DIAGNOSIS — Z794 Long term (current) use of insulin: Secondary | ICD-10-CM

## 2023-10-28 DIAGNOSIS — E1165 Type 2 diabetes mellitus with hyperglycemia: Secondary | ICD-10-CM

## 2023-10-28 DIAGNOSIS — E348 Other specified endocrine disorders: Secondary | ICD-10-CM | POA: Diagnosis not present

## 2023-10-28 DIAGNOSIS — L99 Other disorders of skin and subcutaneous tissue in diseases classified elsewhere: Secondary | ICD-10-CM

## 2023-10-28 DIAGNOSIS — Z789 Other specified health status: Secondary | ICD-10-CM | POA: Diagnosis not present

## 2023-10-28 LAB — HM DIABETES FOOT EXAM

## 2023-10-28 LAB — MICROALBUMIN / CREATININE URINE RATIO
Creatinine,U: 171.1 mg/dL
Microalb Creat Ratio: 6.3 mg/g (ref 0.0–30.0)
Microalb, Ur: 1.1 mg/dL (ref 0.0–1.9)

## 2023-10-28 MED ORDER — FLUOXETINE HCL 10 MG PO TABS: 10.0000 mg | ORAL_TABLET | Freq: Every day | ORAL | 3 refills | Status: AC

## 2023-10-28 MED ORDER — METFORMIN HCL 850 MG PO TABS: 850.0000 mg | ORAL_TABLET | Freq: Three times a day (TID) | ORAL | 1 refills | Status: DC

## 2023-10-28 NOTE — Assessment & Plan Note (Signed)
Chronic, poor control. Intolerant of statins and not interested in retrial or alternate medications for cholesterol at this time.  The 10-year ASCVD risk score (Arnett DK, et al., 2019) is: 10.1%   Values used to calculate the score:     Age: 68 years     Sex: Female     Is Non-Hispanic African American: No     Diabetic: Yes     Tobacco smoker: No     Systolic Blood Pressure: 120 mmHg     Is BP treated: No     HDL Cholesterol: 39.5 mg/dL     Total Cholesterol: 179 mg/dL   Did discuss mother  MI/ CAD history increase her risk.

## 2023-10-28 NOTE — Assessment & Plan Note (Addendum)
worsened control, chronic.    metformin 850 mg  3 times daily  ( at max dose) SE to invokana.. cannot tolerate SGlt2i.  Increase Lantus to 20 Units daily.  Increase water, decrease creamer in coffee, increase exercise.

## 2023-10-28 NOTE — Assessment & Plan Note (Signed)
New, recurrent... start low dose Prozac, not interested in referral to counseling.

## 2023-10-28 NOTE — Assessment & Plan Note (Signed)
Not currently treated. Not interested in referral back to rheum at this time.

## 2023-10-28 NOTE — Progress Notes (Signed)
Patient ID: Miranda Greene, female    DOB: 1956/04/08, 68 y.o.   MRN: [id number]  This visit was conducted in person.  BP 120/74 (BP Location: Right Arm, Patient Position: Sitting, Cuff Size: Normal)   Pulse 96   Temp (!) 97.5 F (36.4 C) (Temporal)   Ht 5' 3.5" (1.613 m)   Wt 129 lb 2 oz (58.6 kg)   SpO2 99%   BMI 22.51 kg/m    CC:  Chief Complaint  Patient presents with   Medicare Wellness    Welcome to Medicare    Subjective:   HPI: Miranda Greene is a 68 y.o. female presenting on 10/28/2023 for Medicare Wellness (Welcome to [employer])  The patient presents for  welcome to medicare wellness, complete physical and review of chronic health problems. He/She also has the following acute concerns today:  She has been feeling more anxious lately, tearful.. she would like to restart prozac, tolerated it in the past.  PHQ9: 3  GAD7 3  I have personally reviewed the Medicare Annual Wellness questionnaire and have noted 1. The patient's medical and social history 2. Their use of alcohol, tobacco or illicit drugs 3. Their current medications and supplements 4. The patient's functional ability including ADL's, fall risks, home safety risks and hearing or visual             impairment. 5. Diet and physical activities 6. Evidence for depression or mood disorders 7.         Updated provider list Cognitive evaluation was performed and recorded on pt medicare questionnaire form. The patients weight, height, BMI and visual acuity have been recorded in the chart   I have made referrals, counseling and provided education to the patient based review of the above and I have provided the pt with a written personalized care plan for preventive services.   Documentation of this information was scanned into the electronic record under the media tab.  Hearing Screening  Method: Audiometry   500Hz  1000Hz  2000Hz  4000Hz   Right ear 20 20 20 20   Left ear 20 20 20 20    Vision Screening    Right eye Left eye Both eyes  Without correction     With correction 20/40 20/25 20/20        One falls in last 12 months... tripped over item   Advance directives and end of life planning reviewed in detail with patient and documented in EMR. Patient given handout on advance care directives if needed. HCPOA and living will updated if needed.  Elevated Cholesterol: SE to statin x 2  in past.. myalgia Lab Results  Component Value Date   CHOL 179 10/21/2023   HDL 39.50 10/21/2023   LDLCALC 109 (H) 10/21/2023   TRIG 152.0 (H) 10/21/2023   CHOLHDL 5 10/21/2023  Using medications without problems: Muscle aches:  Diet compliance: low carb Exercise: walking 2-3 times a week. Other complaints:   Wt Readings from Last 3 Encounters:  10/28/23 129 lb 2 oz (58.6 kg)  04/25/23 132 lb (59.9 kg)  04/23/23 131 lb (59.4 kg)    Diabetes:  Moderate control on metformin 850 mg  3 times daily  Lantus 18 units daily ( has cut back further as she was worried about cost) Lab Results  Component Value Date   HGBA1C 7.7 (H) 10/21/2023  Using medications without difficulties: none Hypoglycemic episodes:   none Hyperglycemic episodes:  occ Feet problems: none Blood Sugars averaging:  not checking regularly, post prandial over  weekend 153 eye exam within last year: yes  Due for urine microalbumin.  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     Relevant past medical, surgical, family and social history reviewed and updated as indicated. Interim medical history since our last visit reviewed. Allergies and medications reviewed and updated. Outpatient Medications Prior to Visit  Medication Sig Dispense Refill   fluconazole (DIFLUCAN) 100 MG tablet Take 1 tablet daily for 5 days, then 1 tablet weekly for 5 weeks.     ibuprofen (ADVIL,MOTRIN) 200 MG tablet Take 200 mg by mouth every 6 (six) hours as needed for moderate pain.      insulin glargine (LANTUS SOLOSTAR) 100 UNIT/ML Solostar Pen  Inject 18 Units into the skin at bedtime. 3 mL 3   Multiple Vitamins-Minerals (CENTRUM SILVER 50+WOMEN) TABS Take 1 tablet by mouth daily.     omeprazole (PRILOSEC) 20 MG capsule Take 20 mg by mouth daily.     Probiotic Product (PROBIOTIC BLEND PO) Take by mouth.     metFORMIN (GLUCOPHAGE) 850 MG tablet Take 1 tablet (850 mg total) by mouth with breakfast, with lunch, and with evening meal. 270 tablet 1   sulfamethoxazole-trimethoprim (BACTRIM DS) 800-160 MG tablet Take 1 tablet by mouth 2 (two) times daily. 6 tablet 2   No facility-administered medications prior to visit.     Per HPI unless specifically indicated in ROS section below Review of Systems  Constitutional:  Negative for fatigue and fever.  HENT:  Negative for congestion.   Eyes:  Negative for pain.  Respiratory:  Negative for cough and shortness of breath.   Cardiovascular:  Negative for chest pain, palpitations and leg swelling.  Gastrointestinal:  Negative for abdominal pain.  Genitourinary:  Negative for dysuria and vaginal bleeding.  Musculoskeletal:  Negative for back pain.  Neurological:  Negative for syncope, light-headedness and headaches.  Psychiatric/Behavioral:  Negative for dysphoric mood.    Objective:  BP 120/74 (BP Location: Right Arm, Patient Position: Sitting, Cuff Size: Normal)   Pulse 96   Temp (!) 97.5 F (36.4 C) (Temporal)   Ht 5' 3.5" (1.613 m)   Wt 129 lb 2 oz (58.6 kg)   SpO2 99%   BMI 22.51 kg/m   Wt Readings from Last 3 Encounters:  10/28/23 129 lb 2 oz (58.6 kg)  04/25/23 132 lb (59.9 kg)  04/23/23 131 lb (59.4 kg)      Physical Exam Vitals and nursing note reviewed.  Constitutional:      General: She is not in acute distress.    Appearance: Normal appearance. She is well-developed. She is not ill-appearing or toxic-appearing.  HENT:     Head: Normocephalic.     Right Ear: Hearing, tympanic membrane, ear canal and external ear normal.     Left Ear: Hearing, tympanic membrane, ear  canal and external ear normal.     Nose: Nose normal.  Eyes:     General: Lids are normal. Lids are everted, no foreign bodies appreciated.     Conjunctiva/sclera: Conjunctivae normal.     Pupils: Pupils are equal, round, and reactive to light.  Neck:     Thyroid: No thyroid mass or thyromegaly.     Vascular: No carotid bruit.     Trachea: Trachea normal.  Cardiovascular:     Rate and Rhythm: Normal rate and regular rhythm.     Heart sounds: Normal heart sounds, S1 normal and S2 normal. No murmur heard.    No gallop.  Pulmonary:  Effort: Pulmonary effort is normal. No respiratory distress.     Breath sounds: Normal breath sounds. No wheezing, rhonchi or rales.  Abdominal:     General: Bowel sounds are normal. There is no distension or abdominal bruit.     Palpations: Abdomen is soft. There is no fluid wave or mass.     Tenderness: There is no abdominal tenderness. There is no guarding or rebound.     Hernia: No hernia is present.  Musculoskeletal:     Cervical back: Normal range of motion and neck supple.  Lymphadenopathy:     Cervical: No cervical adenopathy.  Skin:    General: Skin is warm and dry.     Findings: No rash.  Neurological:     Mental Status: She is alert.     Cranial Nerves: No cranial nerve deficit.     Sensory: No sensory deficit.  Psychiatric:        Mood and Affect: Mood is not anxious or depressed.        Speech: Speech normal.        Behavior: Behavior normal. Behavior is cooperative.        Judgment: Judgment normal.      Diabetic foot exam: Normal inspection No skin breakdown No calluses  Normal DP pulses Normal sensation to light touch and monofilament Nails normal     Results for orders placed or performed in visit on 10/28/23  HM DIABETES FOOT EXAM   Collection Time: 10/28/23 12:00 AM  Result Value Ref Range   HM Diabetic Foot Exam done      COVID 19 screen:  No recent travel or known exposure to COVID19 The patient denies  respiratory symptoms of COVID 19 at this time. The importance of social distancing was discussed today.   Assessment and Plan   The patient's preventative maintenance and recommended screening tests for an annual wellness exam were reviewed in full today. Brought up to date unless services declined.  Counselled on the importance of diet, exercise, and its role in overall health and mortality. The patient's FH and SH was reviewed, including their home life, tobacco status, and drug and alcohol status.   Vaccines: Due for shingrix, COVID vaccine x 3 ( considering  x 4) Uptodate  td. Refuses flu. Given prevnar.  Pap/DVE:  partial hysterectomy Mammo:  S/P total mastectomy, history of breast cancer, followed by Dr. Magnus Ivan not indicated. Bone Density:  DUE Colon: 09/11/20, repeat 10 years Smoking Status:none ETOH/ drug ZOX:WRUE/AVWU  Hep C:  negative  Problem List Items Addressed This Visit     Amyloidosis of skin (HCC)   Stable, followed by rheum in past.      Hyperlipidemia associated with type 2 diabetes mellitus (HCC)    Chronic, poor control. Intolerant of statins and not interested in retrial or alternate medications for cholesterol at this time.  The 10-year ASCVD risk score (Arnett DK, et al., 2019) is: 10.1%   Values used to calculate the score:     Age: 6 years     Sex: Female     Is Non-Hispanic African American: No     Diabetic: Yes     Tobacco smoker: No     Systolic Blood Pressure: 120 mmHg     Is BP treated: No     HDL Cholesterol: 39.5 mg/dL     Total Cholesterol: 179 mg/dL   Did discuss mother  MI/ CAD history increase her risk.       Relevant Medications  metFORMIN (GLUCOPHAGE) 850 MG tablet   MDD (major depressive disorder), recurrent episode, mild (HCC)    New, recurrent... start low dose Prozac, not interested in referral to counseling.      Relevant Medications   FLUoxetine (PROZAC) 10 MG tablet   Statin intolerance   Given past intolerance she  continues to refuse statin medication or other treatment of cholesterol at this time.      Type 2 diabetes mellitus with hyperglycemia (HCC)    worsened control, chronic.    metformin 850 mg  3 times daily  ( at max dose) SE to invokana.. cannot tolerate SGlt2i.  Increase Lantus to 20 Units daily.  Increase water, decrease creamer in coffee, increase exercise.        Relevant Medications   metFORMIN (GLUCOPHAGE) 850 MG tablet   Other Visit Diagnoses       Welcome to Medicare preventive visit    -  Primary     Diabetes mellitus treated with insulin and oral medication (HCC)       Relevant Medications   metFORMIN (GLUCOPHAGE) 850 MG tablet     Type 2 diabetes mellitus without complication, without long-term current use of insulin (HCC)       Relevant Medications   metFORMIN (GLUCOPHAGE) 850 MG tablet     Estradiol deficiency       Relevant Orders   DG Bone Density       Kerby Nora, MD

## 2023-10-28 NOTE — Patient Instructions (Addendum)
Increase Lantus to 20 Units daily.  Increase water, decrease creamer in coffee, increase exercise.

## 2023-10-28 NOTE — Addendum Note (Signed)
Addended by: Damita Lack on: 10/28/2023 09:35 AM   Modules accepted: Orders

## 2023-10-28 NOTE — Assessment & Plan Note (Signed)
Given past intolerance she continues to refuse statin medication or other treatment of cholesterol at this time.

## 2023-10-28 NOTE — Assessment & Plan Note (Addendum)
Followed by [facility].

## 2023-10-31 NOTE — Telephone Encounter (Signed)
FYI, patient was never seen by Urology.   She plans to schedule when she is able.

## 2023-11-20 DIAGNOSIS — R413 Other amnesia: Secondary | ICD-10-CM | POA: Diagnosis not present

## 2023-11-20 DIAGNOSIS — L99 Other disorders of skin and subcutaneous tissue in diseases classified elsewhere: Secondary | ICD-10-CM | POA: Diagnosis not present

## 2023-11-20 DIAGNOSIS — E854 Organ-limited amyloidosis: Secondary | ICD-10-CM | POA: Diagnosis not present

## 2024-01-09 DIAGNOSIS — G3184 Mild cognitive impairment, so stated: Secondary | ICD-10-CM | POA: Diagnosis not present

## 2024-01-26 DIAGNOSIS — G3184 Mild cognitive impairment, so stated: Secondary | ICD-10-CM | POA: Diagnosis not present

## 2024-02-06 ENCOUNTER — Encounter: Payer: Self-pay | Admitting: Internal Medicine

## 2024-02-06 DIAGNOSIS — N390 Urinary tract infection, site not specified: Secondary | ICD-10-CM

## 2024-02-10 ENCOUNTER — Other Ambulatory Visit (HOSPITAL_COMMUNITY): Payer: Self-pay

## 2024-04-26 DIAGNOSIS — H5213 Myopia, bilateral: Secondary | ICD-10-CM | POA: Diagnosis not present

## 2024-04-26 DIAGNOSIS — H43813 Vitreous degeneration, bilateral: Secondary | ICD-10-CM | POA: Diagnosis not present

## 2024-04-26 DIAGNOSIS — H2513 Age-related nuclear cataract, bilateral: Secondary | ICD-10-CM | POA: Diagnosis not present

## 2024-04-26 DIAGNOSIS — E119 Type 2 diabetes mellitus without complications: Secondary | ICD-10-CM | POA: Diagnosis not present

## 2024-04-26 DIAGNOSIS — H35413 Lattice degeneration of retina, bilateral: Secondary | ICD-10-CM | POA: Diagnosis not present

## 2024-04-26 LAB — HM DIABETES EYE EXAM

## 2024-04-28 DIAGNOSIS — R350 Frequency of micturition: Secondary | ICD-10-CM | POA: Diagnosis not present

## 2024-04-28 DIAGNOSIS — N3944 Nocturnal enuresis: Secondary | ICD-10-CM | POA: Diagnosis not present

## 2024-04-28 DIAGNOSIS — N3946 Mixed incontinence: Secondary | ICD-10-CM | POA: Diagnosis not present

## 2024-04-28 DIAGNOSIS — R351 Nocturia: Secondary | ICD-10-CM | POA: Diagnosis not present

## 2024-05-11 ENCOUNTER — Encounter: Payer: Self-pay | Admitting: Family Medicine

## 2024-05-14 ENCOUNTER — Encounter: Payer: Self-pay | Admitting: Family Medicine

## 2024-05-14 ENCOUNTER — Ambulatory Visit: Admitting: Family Medicine

## 2024-05-14 ENCOUNTER — Ambulatory Visit: Payer: Self-pay | Admitting: Family Medicine

## 2024-05-14 VITALS — BP 140/90 | HR 83 | Temp 98.2°F | Ht 63.5 in | Wt 126.5 lb

## 2024-05-14 DIAGNOSIS — F330 Major depressive disorder, recurrent, mild: Secondary | ICD-10-CM

## 2024-05-14 DIAGNOSIS — G3184 Mild cognitive impairment, so stated: Secondary | ICD-10-CM | POA: Diagnosis not present

## 2024-05-14 DIAGNOSIS — Z794 Long term (current) use of insulin: Secondary | ICD-10-CM

## 2024-05-14 DIAGNOSIS — E1165 Type 2 diabetes mellitus with hyperglycemia: Secondary | ICD-10-CM

## 2024-05-14 DIAGNOSIS — E119 Type 2 diabetes mellitus without complications: Secondary | ICD-10-CM

## 2024-05-14 DIAGNOSIS — R251 Tremor, unspecified: Secondary | ICD-10-CM

## 2024-05-14 DIAGNOSIS — Z7984 Long term (current) use of oral hypoglycemic drugs: Secondary | ICD-10-CM | POA: Diagnosis not present

## 2024-05-14 LAB — LIPID PANEL
Cholesterol: 187 mg/dL (ref 0–200)
HDL: 41.1 mg/dL (ref >39.00)
LDL Cholesterol: 116 mg/dL — ABNORMAL HIGH (ref 0–99)
NonHDL: 146.25
Total CHOL/HDL Ratio: 5
Triglycerides: 151 mg/dL — ABNORMAL HIGH (ref 0.0–149.0)
VLDL: 30.2 mg/dL (ref 0.0–40.0)

## 2024-05-14 LAB — COMPREHENSIVE METABOLIC PANEL WITH GFR
ALT: 10 U/L (ref 0–35)
AST: 17 U/L (ref 0–37)
Albumin: 4.3 g/dL (ref 3.5–5.2)
Alkaline Phosphatase: 66 U/L (ref 39–117)
BUN: 13 mg/dL (ref 6–23)
CO2: 28 meq/L (ref 19–32)
Calcium: 9.1 mg/dL (ref 8.4–10.5)
Chloride: 101 meq/L (ref 96–112)
Creatinine, Ser: 0.84 mg/dL (ref 0.40–1.20)
GFR: 72.41 mL/min (ref >60.00)
Glucose, Bld: 80 mg/dL (ref 70–99)
Potassium: 4.8 meq/L (ref 3.5–5.1)
Sodium: 137 meq/L (ref 135–145)
Total Bilirubin: 0.6 mg/dL (ref 0.2–1.2)
Total Protein: 6.8 g/dL (ref 6.0–8.3)

## 2024-05-14 LAB — CBC WITH DIFFERENTIAL/PLATELET
Basophils Absolute: 0 K/uL (ref 0.0–0.1)
Basophils Relative: 1 % (ref 0.0–3.0)
Eosinophils Absolute: 0.1 K/uL (ref 0.0–0.7)
Eosinophils Relative: 2.5 % (ref 0.0–5.0)
HCT: 37.5 % (ref 36.0–46.0)
Hemoglobin: 12.4 g/dL (ref 12.0–15.0)
Lymphocytes Relative: 34.2 % (ref 12.0–46.0)
Lymphs Abs: 1.7 K/uL (ref 0.7–4.0)
MCHC: 33 g/dL (ref 30.0–36.0)
MCV: 91.7 fl (ref 78.0–100.0)
Monocytes Absolute: 0.4 K/uL (ref 0.1–1.0)
Monocytes Relative: 8.5 % (ref 3.0–12.0)
Neutro Abs: 2.6 K/uL (ref 1.4–7.7)
Neutrophils Relative %: 53.8 % (ref 43.0–77.0)
Platelets: 332 K/uL (ref 150.0–400.0)
RBC: 4.09 Mil/uL (ref 3.87–5.11)
RDW: 13.5 % (ref 11.5–15.5)
WBC: 4.8 K/uL (ref 4.0–10.5)

## 2024-05-14 LAB — POCT GLYCOSYLATED HEMOGLOBIN (HGB A1C): Hemoglobin A1C: 6.8 % — AB (ref 4.0–5.6)

## 2024-05-14 LAB — VITAMIN B12: Vitamin B-12: 700 pg/mL (ref 211–911)

## 2024-05-14 LAB — HEMOGLOBIN A1C: Hgb A1c MFr Bld: 7.4 % — ABNORMAL HIGH (ref 4.6–6.5)

## 2024-05-14 LAB — TSH: TSH: 1.79 u[IU]/mL (ref 0.35–5.50)

## 2024-05-14 NOTE — Assessment & Plan Note (Signed)
 Has upcoming appointment at [facility] geriatric, memory clinic. Of note MRI looking into memory issues did show microhemorrhages, unclear if this could be related to her previous diagnosis of amyloidosis.

## 2024-05-14 NOTE — Assessment & Plan Note (Addendum)
 Chronic, ongoing in 1 year, worsening recently.  Will look into possible secondary causes with lab work including complete metabolic panel, thyroid, B12. Possible side effect of Prozac . Question of whether her past skin amyloidosis is affecting her brain functioning as well?  No family history of Parkinson's or benign familial tremor.  No sign of parkinsonism on exam.  She will follow-up with geriatric clinic regarding this as well.  She may need a referral to neurology.

## 2024-05-14 NOTE — Assessment & Plan Note (Addendum)
 Due for reevaluation  metformin  850 mg  3 times daily  ( at max dose) SE to invokana .. cannot tolerate SGlt2i.  Increase Lantus  to 20 Units daily.  Increase water, decrease creamer in coffee, increase exercise.

## 2024-05-14 NOTE — Progress Notes (Signed)
 Patient ID: Miranda Greene, female    DOB: May 08, 1956, 68 y.o.   MRN: [id number]  This visit was conducted in person.  BP (!) 140/90   Pulse 83   Temp 98.2 F (36.8 C) (Temporal)   Ht 5' 3.5 (1.613 m)   Wt 126 lb 8 oz (57.4 kg)   SpO2 99%   BMI 22.06 kg/m    CC:  Chief Complaint  Patient presents with   Tremors    Subjective:   HPI: Miranda Greene is a 68 y.o. female presenting on 05/14/2024 for Tremors   She has noted tremor in hands and head, feet. Started 1 year ago, gradually worsening.  Was having memory issue and head pain in 01/2024, Has amyloid doctor referred her to memory clinic,   Reviewed summary of note from Jan 09, 2024.   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   Did have  01/2024 MRI : Microhemorrhages (<= 10 mm)  .  Total number (exact number if 1-10 or greater than 10): 2  .  Location (deep vs lobar): 1 deep (right thalamus) and 1 lobar (right parietal lobe)  .  Sequence detected (GRE, SWI, or both): N/A  Superficial siderosis  .  Number of sulci: 0 on GRE and 0 on SWI  .  Description: N/A  Macrohemorrhages (> 10 mm): 0   no other new issues except some dizziness with bending over and standing. Also balance worsening in last 3-4 months.  Memory now improved, no current headaches. Seems to improve after retiring.. was stressful.  She has been feeling more fatigued lately, less endurance  Hx of amyloidosis.      No family history of tremor, no parkinson's disease in family.  Only  new med is prozac  in 10 mg.. mood much better overall   Has appt  memory clinic at [facility]  later in September.   Bp controlled at home.   Relevant past medical, surgical, family and social history reviewed and updated as indicated. Interim medical history since our last visit reviewed. Allergies and medications reviewed and updated. Outpatient Medications Prior to Visit  Medication Sig Dispense Refill   fluconazole   (DIFLUCAN ) 100 MG tablet Take 1 tablet daily for 5 days, then 1 tablet weekly for 5 weeks.     FLUoxetine  (PROZAC ) 10 MG tablet Take 1 tablet (10 mg total) by mouth daily. 90 tablet 3   ibuprofen (ADVIL,MOTRIN) 200 MG tablet Take 200 mg by mouth every 6 (six) hours as needed for moderate pain.      insulin  glargine (LANTUS  SOLOSTAR) 100 UNIT/ML Solostar Pen Inject 18 Units into the skin at bedtime. 3 mL 3   metFORMIN  (GLUCOPHAGE ) 850 MG tablet Take 1 tablet (850 mg total) by mouth with breakfast, with lunch, and with evening meal. 270 tablet 1   Multiple Vitamins-Minerals (CENTRUM SILVER 50+WOMEN) TABS Take 1 tablet by mouth daily.     omeprazole (PRILOSEC) 20 MG capsule Take 20 mg by mouth daily.     trimethoprim  (TRIMPEX ) 100 MG tablet Take 100 mg by mouth daily.     Probiotic Product (PROBIOTIC BLEND PO) Take by mouth.     No facility-administered medications prior to visit.     Per HPI unless specifically indicated in ROS section below Review of Systems Objective:  BP (!) 140/90   Pulse 83   Temp 98.2 F (36.8 C) (Temporal)   Ht 5' 3.5 (1.613 m)   Wt 126 lb  8 oz (57.4 kg)   SpO2 99%   BMI 22.06 kg/m   Wt Readings from Last 3 Encounters:  05/14/24 126 lb 8 oz (57.4 kg)  10/28/23 129 lb 2 oz (58.6 kg)  04/25/23 132 lb (59.9 kg)      Physical Exam Constitutional:      General: She is not in acute distress.    Appearance: Normal appearance. She is well-developed. She is not ill-appearing or toxic-appearing.  HENT:     Head: Normocephalic.     Right Ear: Hearing, tympanic membrane, ear canal and external ear normal. Tympanic membrane is not erythematous, retracted or bulging.     Left Ear: Hearing, tympanic membrane, ear canal and external ear normal. Tympanic membrane is not erythematous, retracted or bulging.     Nose: No mucosal edema or rhinorrhea.     Right Sinus: No maxillary sinus tenderness or frontal sinus tenderness.     Left Sinus: No maxillary sinus tenderness or  frontal sinus tenderness.     Mouth/Throat:     Pharynx: Uvula midline.  Eyes:     General: Lids are normal. Lids are everted, no foreign bodies appreciated.     Conjunctiva/sclera: Conjunctivae normal.     Pupils: Pupils are equal, round, and reactive to light.  Neck:     Thyroid: No thyroid mass or thyromegaly.     Vascular: No carotid bruit.     Trachea: Trachea normal.  Cardiovascular:     Rate and Rhythm: Normal rate and regular rhythm.     Pulses: Normal pulses.     Heart sounds: Normal heart sounds, S1 normal and S2 normal. No murmur heard.    No friction rub. No gallop.  Pulmonary:     Effort: Pulmonary effort is normal. No tachypnea or respiratory distress.     Breath sounds: Normal breath sounds. No decreased breath sounds, wheezing, rhonchi or rales.  Abdominal:     General: Bowel sounds are normal.     Palpations: Abdomen is soft.     Tenderness: There is no abdominal tenderness.  Musculoskeletal:     Cervical back: Normal range of motion and neck supple.  Skin:    General: Skin is warm and dry.     Findings: No rash.  Neurological:     Mental Status: She is alert and oriented to person, place, and time.     GCS: GCS eye subscore is 4. GCS verbal subscore is 5. GCS motor subscore is 6.     Cranial Nerves: No cranial nerve deficit.     Sensory: No sensory deficit.     Motor: Tremor present. No abnormal muscle tone.     Coordination: Coordination normal.     Gait: Gait normal.     Deep Tendon Reflexes: Reflexes are normal and symmetric.     Comments: Nml cerebellar exam although balance somewhat off as she lost balance stepping up to table.   No papilledema  Psychiatric:        Mood and Affect: Mood is not anxious or depressed.        Speech: Speech normal.        Behavior: Behavior normal. Behavior is cooperative.        Thought Content: Thought content normal.        Cognition and Memory: Memory is not impaired. She does not exhibit impaired recent memory or  impaired remote memory.        Judgment: Judgment normal.  Results for orders placed or performed in visit on 05/14/24  POCT glycosylated hemoglobin (Hb A1C)   Collection Time: 05/14/24 10:23 AM  Result Value Ref Range   Hemoglobin A1C 6.8 (A) 4.0 - 5.6 %   HbA1c POC (<> result, manual entry)     HbA1c, POC (prediabetic range)     HbA1c, POC (controlled diabetic range)      Assessment and Plan  Diabetes mellitus treated with insulin  and oral medication (HCC) Assessment & Plan: Chronic, due for reevaluation with A1c.   Orders: -     POCT glycosylated hemoglobin (Hb A1C) -     Hemoglobin A1c -     Lipid panel  Mild cognitive impairment Assessment & Plan: Has upcoming appointment at [facility] geriatric, memory clinic. Of note MRI looking into memory issues did show microhemorrhages, unclear if this could be related to her previous diagnosis of amyloidosis.   Tremor Assessment & Plan: Chronic, ongoing in 1 year, worsening recently.  Will look into possible secondary causes with lab work including complete metabolic panel, thyroid, B12. Possible side effect of Prozac . Question of whether her past skin amyloidosis is affecting her brain functioning as well?  No family history of Parkinson's or benign familial tremor.  No sign of parkinsonism on exam.  She will follow-up with geriatric clinic regarding this as well.  She may need a referral to neurology.  Orders: -     CBC with Differential/Platelet -     TSH -     Comprehensive metabolic panel with GFR -     Vitamin B12  MDD (major depressive disorder), recurrent episode, mild (HCC) Assessment & Plan: Chronic, significant improvement with Prozac  10 mg daily.  This seems to have also helped her cognitive impairment overall.  There was some discussion as to whether Prozac  could be causing her tremor.  Given it has been so beneficial for her we will hold off on stopping it and but look into other causes  first.   Type 2 diabetes mellitus with hyperglycemia, with long-term current use of insulin  (HCC) Assessment & Plan: Due for reevaluation  metformin  850 mg  3 times daily  ( at max dose) SE to invokana .. cannot tolerate SGlt2i.  Increase Lantus  to 20 Units daily.  Increase water, decrease creamer in coffee, increase exercise.       No follow-ups on file.   Greig Ring, MD

## 2024-05-14 NOTE — Assessment & Plan Note (Addendum)
 Chronic, significant improvement with Prozac  10 mg daily.  This seems to have also helped her cognitive impairment overall.  There was some discussion as to whether Prozac  could be causing her tremor.  Given it has been so beneficial for her we will hold off on stopping it and but look into other causes first.

## 2024-05-14 NOTE — Assessment & Plan Note (Signed)
 Chronic, due for reevaluation with A1c.

## 2024-05-20 DIAGNOSIS — L99 Other disorders of skin and subcutaneous tissue in diseases classified elsewhere: Secondary | ICD-10-CM | POA: Diagnosis not present

## 2024-05-20 DIAGNOSIS — E854 Organ-limited amyloidosis: Secondary | ICD-10-CM | POA: Diagnosis not present

## 2024-05-24 ENCOUNTER — Encounter: Payer: Self-pay | Admitting: Family Medicine

## 2024-05-28 ENCOUNTER — Other Ambulatory Visit: Payer: Self-pay | Admitting: Family Medicine

## 2024-05-28 DIAGNOSIS — R251 Tremor, unspecified: Secondary | ICD-10-CM

## 2024-06-07 ENCOUNTER — Encounter: Payer: Self-pay | Admitting: Neurology

## 2024-06-09 DIAGNOSIS — R399 Unspecified symptoms and signs involving the genitourinary system: Secondary | ICD-10-CM | POA: Diagnosis not present

## 2024-06-10 DIAGNOSIS — N3020 Other chronic cystitis without hematuria: Secondary | ICD-10-CM | POA: Diagnosis not present

## 2024-06-10 DIAGNOSIS — R350 Frequency of micturition: Secondary | ICD-10-CM | POA: Diagnosis not present

## 2024-06-10 DIAGNOSIS — N390 Urinary tract infection, site not specified: Secondary | ICD-10-CM | POA: Diagnosis not present

## 2024-06-14 ENCOUNTER — Other Ambulatory Visit: Payer: Self-pay | Admitting: Family Medicine

## 2024-06-14 DIAGNOSIS — E119 Type 2 diabetes mellitus without complications: Secondary | ICD-10-CM

## 2024-07-09 ENCOUNTER — Other Ambulatory Visit

## 2024-07-14 NOTE — Progress Notes (Signed)
 Assessment/Plan:    Tremor, by history  - I saw no evidence of a neurodegenerative disorder today.  Reassurance was provided.  She meets no clinical criteria for Parkinsons disease.  I actually really did not see a lot of tremor today.  She had an axial movement intermittently that was inconsistent with a known movement d/o and may be functional in nature.  2.  MCI  - Follows at the geriatric clinic at [facility] and has a follow-up appointment with the PA on November 14  3.  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  Given she is already seen in the geriatric clinic and they are ordering clinicians for the MRI, they can decide if they would like to do amyloid PET for further exploration of her questions.  I don't think it is essential but she does have a f/u in a few days with them.    4.  F/u prn  Subjective:   Miranda Greene was seen today in the movement disorders clinic for neurologic consultation at the request of Avelina Greig BRAVO, MD.  The consultation is for the evaluation of tremor.  Medical records not available to me are reviewed.  Husband with patient who supplements hx.  Tremor: Yes.     How long has it been going on? Started earlier this year but worse over the last 2-3 weeks.  Its in the arms/hands, torso and head.  She was on prozac  but stopped it for 3 weeks without change in sx's.    At rest or with activation?  both  Fam hx of tremor?  No.  Affected by caffeine:  unknown (drinks coffee all day)  Affected by alcohol:  No.  Affected by stress:  Yes.  , worse  Affected by fatigue:  she gets fatigued easily and the movements will increase  Spills soup if on spoon:  Yes.    Spills glass of liquid if full:  may/may not  Affects ADL's (tying shoes, brushing teeth, etc):  No.  Other  Specific Symptoms:  Voice: it may get hoarse or go up/down Sleep: trouble staying asleep Postural symptoms:  Yes.   trouble if she turns quick; sometimes she will be walking and have to stop quickly and the R leg might drag  Falls?  No. Bradykinesia symptoms: slow movements and slowed thought processes Loss of smell:  No. Loss of taste:  No. Urinary Incontinence:  wears pad for intermittent leakage Difficulty Swallowing:  can get choked on liquids or solids Depression:  describes mood as upset and anxious and nervous Memory changes:  Yes.   patient follows at [facility] ger[facility] for this and was first seen May, 2025.  Records are reviewed.  It appears she has had neurocognitive testing and diagnosed with MCI. Hallucinations:  No.  visual distortions: Yes.   N/V:  No. Lightheaded:  Yes.    Syncope: No. Diplopia:  No. But has blurry vision  Patient had an MRI of the brain Jan 26, 2024.  I do not have the films, but I do have the reports.  This was noted to show mild white matter disease and 2 microhemorrhages.   ALLERGIES:   Allergies  Allergen Reactions   Meloxicam Hives   Invokana  [Canagliflozin ] Rash    CURRENT MEDICATIONS:  Current Outpatient Medications  Medication Instructions   fluconazole  (DIFLUCAN ) 100 MG tablet Take 1 tablet daily for 5 days, then 1 tablet weekly for 5 weeks.   FLUoxetine  (PROZAC ) 10 mg, Oral, Daily   ibuprofen (ADVIL) 200 mg, Every 6 hours PRN   Lantus  SoloStar 18 Units, Subcutaneous, Daily at bedtime   metFORMIN  (GLUCOPHAGE ) 850 MG tablet TAKE 1 TABLET BY MOUTH WITH BREAKFAST WITH LUNCH AND WITH EVENING MEAL   Multiple Vitamins-Minerals (CENTRUM SILVER 50+WOMEN) TABS 1 tablet, Daily   omeprazole (PRILOSEC) 20 mg, Daily   trimethoprim  (TRIMPEX ) 100 mg, Daily    Objective:   PHYSICAL EXAMINATION:    VITALS:   Vitals:   07/19/24 1017  BP: (!) 150/79  Pulse: 86  SpO2: 97%  Weight: 126 lb 6.4 oz (57.3 kg)  Height: 5' 3 (1.6 m)     GEN:  The patient appears stated age and is in NAD.  Talks with both arms moving spontaneously HEENT:  Normocephalic, atraumatic.  The mucous membranes are moist. The superficial temporal arteries are without ropiness or tenderness. CV:  RRR Lungs:  CTAB Neck/HEME:  There are no carotid bruits bilaterally.  Neurological examination:  Orientation: The patient is alert and oriented x3.  Cranial nerves: There is good facial symmetry.  Extraocular muscles are intact. The visual fields are full to confrontational testing. The speech is fluent and clear. Soft palate rises symmetrically and there is no tongue deviation. Hearing is intact to conversational tone. Sensation: Sensation is intact to light touch throughout (facial, trunk, extremities). Vibration is intact at the bilateral big toe. There is no extinction with double simultaneous stimulation.  Motor: Strength is 5/5 in the bilateral upper and lower extremities.   Shoulder shrug is equal and symmetric.  There is no pronator drift. Deep tendon reflexes: Deep tendon reflexes are 2/4 at the bilateral biceps, triceps, brachioradialis, patella and achilles. Plantar responses are downgoing bilaterally.  Movement examination: Tone: There is nl tone in the bilateral upper extremities.  The tone in the lower extremities is nl.  Abnormal movements: intermittently, she has a movement across the torso that is best described as the movement one makes when he/she gets a chill.  It abates with distraction.  There is no rest tremor.  No postural or intention tremor.  No tremor with archimedes spirals.   Coordination:  There is no decremation with RAM's, with any form of RAMS, including alternating supination and pronation of the forearm, hand opening and closing, finger taps, heel taps and toe taps.  Gait and Station: The patient has no difficulty arising out of a deep-seated chair without the use of the hands. The patient's gait is cautious, wide-based,  very tenuous.  When she turns, her legs are wide apart and she holds in the turn for a second and then continues about the turn.  She does not turn en bloc I have reviewed and interpreted the following labs independently   Chemistry      Component Value Date/Time   NA 137 05/14/2024 1049   K 4.8 05/14/2024 1049   CL 101 05/14/2024 1049   CO2 28 05/14/2024 1049   BUN 13 05/14/2024 1049   CREATININE 0.84 05/14/2024 1049   CREATININE 0.94 04/11/2020 1438      Component Value Date/Time   CALCIUM  9.1 05/14/2024 1049   ALKPHOS 66 05/14/2024 1049   AST 17 05/14/2024 1049   AST 16 04/11/2020 1438   ALT 10 05/14/2024 1049   ALT 11 04/11/2020 1438  BILITOT 0.6 05/14/2024 1049   BILITOT 0.5 04/11/2020 1438      Lab Results  Component Value Date   TSH 1.79 05/14/2024   Lab Results  Component Value Date   WBC 4.8 05/14/2024   HGB 12.4 05/14/2024   HCT 37.5 05/14/2024   MCV 91.7 05/14/2024   PLT 332.0 05/14/2024   Lab Results  Component Value Date   HGBA1C 7.4 (H) 05/14/2024      Total time spent on today's visit was  60 minutes, including both face-to-face time and nonface-to-face time.  Time included that spent on review of records (prior notes available to me/labs/imaging if pertinent), discussing treatment and goals, answering patient's questions and coordinating care.  Cc:  Avelina Greig BRAVO, MD

## 2024-07-19 ENCOUNTER — Ambulatory Visit: Admitting: Neurology

## 2024-07-19 ENCOUNTER — Encounter: Payer: Self-pay | Admitting: Neurology

## 2024-07-19 VITALS — BP 150/79 | HR 86 | Ht 63.0 in | Wt 126.4 lb

## 2024-07-19 DIAGNOSIS — E854 Organ-limited amyloidosis: Secondary | ICD-10-CM | POA: Diagnosis not present

## 2024-07-19 DIAGNOSIS — L99 Other disorders of skin and subcutaneous tissue in diseases classified elsewhere: Secondary | ICD-10-CM

## 2024-07-19 DIAGNOSIS — G3184 Mild cognitive impairment, so stated: Secondary | ICD-10-CM | POA: Diagnosis not present

## 2024-07-19 DIAGNOSIS — R251 Tremor, unspecified: Secondary | ICD-10-CM | POA: Diagnosis not present

## 2024-07-19 NOTE — Patient Instructions (Signed)
 It was good to see you!  Great news - I see no evidence of Parkinsons Disease or any neurodegenerative condition.    The physicians and staff at [facility] are committed to providing excellent care. You may receive a survey requesting feedback about your experience at our office. We strive to receive very good responses to the survey questions. If you feel that your experience would prevent you from giving the office a very good  response, please contact our office to try to remedy the situation. We may be reached at [phone]. Thank you for taking the time out of your busy day to complete the survey.

## 2024-07-22 ENCOUNTER — Encounter: Payer: Self-pay | Admitting: Family Medicine

## 2024-07-22 ENCOUNTER — Ambulatory Visit (HOSPITAL_BASED_OUTPATIENT_CLINIC_OR_DEPARTMENT_OTHER)
Admission: RE | Admit: 2024-07-22 | Discharge: 2024-07-22 | Disposition: A | Discharge status: Home / Self Care | Source: Ambulatory Visit | Attending: Family Medicine | Admitting: Family Medicine

## 2024-07-22 ENCOUNTER — Ambulatory Visit: Payer: Self-pay | Admitting: Family Medicine

## 2024-07-22 DIAGNOSIS — E348 Other specified endocrine disorders: Secondary | ICD-10-CM | POA: Insufficient documentation

## 2024-07-22 DIAGNOSIS — M810 Age-related osteoporosis without current pathological fracture: Secondary | ICD-10-CM | POA: Insufficient documentation

## 2024-07-23 DIAGNOSIS — R29818 Other symptoms and signs involving the nervous system: Secondary | ICD-10-CM | POA: Diagnosis not present

## 2024-07-23 DIAGNOSIS — F330 Major depressive disorder, recurrent, mild: Secondary | ICD-10-CM | POA: Diagnosis not present

## 2024-07-23 DIAGNOSIS — G3184 Mild cognitive impairment, so stated: Secondary | ICD-10-CM | POA: Diagnosis not present

## 2024-09-07 ENCOUNTER — Other Ambulatory Visit: Payer: Self-pay | Admitting: Family Medicine

## 2024-11-25 ENCOUNTER — Encounter: Admitting: Family Medicine

## 2024-11-25 ENCOUNTER — Ambulatory Visit
